# Patient Record
Sex: Female | Born: 1940
Health system: Southern US, Community
[De-identification: ages and names within clinical notes are randomized; demographics above are authoritative.]

## PROBLEM LIST (undated history)

## (undated) DIAGNOSIS — M1990 Unspecified osteoarthritis, unspecified site: Secondary | ICD-10-CM

## (undated) DIAGNOSIS — J849 Interstitial pulmonary disease, unspecified: Secondary | ICD-10-CM

## (undated) DIAGNOSIS — N819 Female genital prolapse, unspecified: Secondary | ICD-10-CM

## (undated) DIAGNOSIS — K760 Fatty (change of) liver, not elsewhere classified: Secondary | ICD-10-CM

## (undated) DIAGNOSIS — F419 Anxiety disorder, unspecified: Secondary | ICD-10-CM

## (undated) DIAGNOSIS — E785 Hyperlipidemia, unspecified: Secondary | ICD-10-CM

## (undated) DIAGNOSIS — F32A Depression, unspecified: Secondary | ICD-10-CM

## (undated) DIAGNOSIS — C801 Malignant (primary) neoplasm, unspecified: Secondary | ICD-10-CM

## (undated) DIAGNOSIS — R202 Paresthesia of skin: Secondary | ICD-10-CM

## (undated) DIAGNOSIS — Z9889 Other specified postprocedural states: Secondary | ICD-10-CM

## (undated) DIAGNOSIS — I872 Venous insufficiency (chronic) (peripheral): Secondary | ICD-10-CM

## (undated) DIAGNOSIS — I10 Essential (primary) hypertension: Secondary | ICD-10-CM

## (undated) DIAGNOSIS — N181 Chronic kidney disease, stage 1: Secondary | ICD-10-CM

## (undated) DIAGNOSIS — G479 Sleep disorder, unspecified: Secondary | ICD-10-CM

## (undated) DIAGNOSIS — R7303 Prediabetes: Secondary | ICD-10-CM

## (undated) DIAGNOSIS — E039 Hypothyroidism, unspecified: Secondary | ICD-10-CM

## (undated) DIAGNOSIS — J479 Bronchiectasis, uncomplicated: Secondary | ICD-10-CM

## (undated) DIAGNOSIS — R32 Unspecified urinary incontinence: Secondary | ICD-10-CM

## (undated) DIAGNOSIS — K219 Gastro-esophageal reflux disease without esophagitis: Secondary | ICD-10-CM

## (undated) DIAGNOSIS — R748 Abnormal levels of other serum enzymes: Secondary | ICD-10-CM

## (undated) DIAGNOSIS — G2581 Restless legs syndrome: Secondary | ICD-10-CM

## (undated) DIAGNOSIS — M810 Age-related osteoporosis without current pathological fracture: Secondary | ICD-10-CM

## (undated) DIAGNOSIS — Z85828 Personal history of other malignant neoplasm of skin: Secondary | ICD-10-CM

## (undated) DIAGNOSIS — K5730 Diverticulosis of large intestine without perforation or abscess without bleeding: Secondary | ICD-10-CM

## (undated) DIAGNOSIS — R001 Bradycardia, unspecified: Secondary | ICD-10-CM

## (undated) DIAGNOSIS — K449 Diaphragmatic hernia without obstruction or gangrene: Secondary | ICD-10-CM

## (undated) DIAGNOSIS — R112 Nausea with vomiting, unspecified: Secondary | ICD-10-CM

## (undated) HISTORY — PX: REDUCTION MAMMAPLASTY: SUR839

## (undated) HISTORY — PX: HYSTEROSCOPY: SHX211

## (undated) HISTORY — DX: Bronchiectasis, uncomplicated: J47.9

## (undated) HISTORY — DX: Interstitial pulmonary disease, unspecified: J84.9

## (undated) HISTORY — PX: HYSTEROSCOPY WITH D & C: SHX1775

## (undated) HISTORY — DX: Hyperlipidemia, unspecified: E78.5

## (undated) HISTORY — DX: Abnormal levels of other serum enzymes: R74.8

## (undated) HISTORY — DX: Depression, unspecified: F32.A

## (undated) HISTORY — PX: BREAST EXCISIONAL BIOPSY: SUR124

## (undated) HISTORY — PX: CHOLECYSTECTOMY: SHX55

## (undated) HISTORY — DX: Essential (primary) hypertension: I10

## (undated) HISTORY — PX: BREAST BIOPSY: SHX20

## (undated) HISTORY — PX: TONSILLECTOMY AND ADENOIDECTOMY: SUR1326

## (undated) HISTORY — PX: FOOT SURGERY: SHX648

## (undated) HISTORY — DX: Gastro-esophageal reflux disease without esophagitis: K21.9

## (undated) HISTORY — DX: Anxiety disorder, unspecified: F41.9

## (undated) HISTORY — PX: BREAST SURGERY: SHX581

## (undated) HISTORY — PX: HIP SURGERY: SHX245

---

## 1966-01-23 HISTORY — PX: CHOLECYSTECTOMY OPEN: SUR202

## 1983-01-24 HISTORY — PX: REDUCTION MAMMAPLASTY: SUR839

## 1997-12-02 ENCOUNTER — Other Ambulatory Visit: Admission: RE | Admit: 1997-12-02 | Discharge: 1997-12-02 | Payer: Self-pay | Admitting: Internal Medicine

## 1997-12-15 ENCOUNTER — Encounter: Payer: Self-pay | Admitting: Family Medicine

## 1997-12-15 ENCOUNTER — Ambulatory Visit (HOSPITAL_COMMUNITY): Admission: RE | Admit: 1997-12-15 | Discharge: 1997-12-15 | Payer: Self-pay | Admitting: Family Medicine

## 1997-12-22 ENCOUNTER — Encounter: Payer: Self-pay | Admitting: Family Medicine

## 1997-12-22 ENCOUNTER — Ambulatory Visit (HOSPITAL_COMMUNITY): Admission: RE | Admit: 1997-12-22 | Discharge: 1997-12-22 | Payer: Self-pay | Admitting: Family Medicine

## 1998-03-22 ENCOUNTER — Ambulatory Visit (HOSPITAL_COMMUNITY): Admission: RE | Admit: 1998-03-22 | Discharge: 1998-03-22 | Payer: Self-pay | Admitting: Urology

## 1998-05-24 HISTORY — PX: CARDIOVASCULAR STRESS TEST: SHX262

## 1998-05-26 ENCOUNTER — Encounter: Payer: Self-pay | Admitting: Emergency Medicine

## 1998-05-26 ENCOUNTER — Inpatient Hospital Stay (HOSPITAL_COMMUNITY): Admission: EM | Admit: 1998-05-26 | Discharge: 1998-05-27 | Payer: Self-pay | Admitting: Emergency Medicine

## 1998-05-28 ENCOUNTER — Ambulatory Visit (HOSPITAL_COMMUNITY): Admission: RE | Admit: 1998-05-28 | Discharge: 1998-05-28 | Payer: Self-pay | Admitting: Cardiology

## 1998-05-28 ENCOUNTER — Encounter: Payer: Self-pay | Admitting: Cardiology

## 1998-12-01 ENCOUNTER — Encounter: Payer: Self-pay | Admitting: Plastic Surgery

## 1998-12-01 ENCOUNTER — Ambulatory Visit (HOSPITAL_COMMUNITY): Admission: RE | Admit: 1998-12-01 | Discharge: 1998-12-01 | Payer: Self-pay | Admitting: Plastic Surgery

## 1998-12-02 ENCOUNTER — Other Ambulatory Visit: Admission: RE | Admit: 1998-12-02 | Discharge: 1998-12-02 | Payer: Self-pay | Admitting: Plastic Surgery

## 1998-12-02 ENCOUNTER — Encounter (INDEPENDENT_AMBULATORY_CARE_PROVIDER_SITE_OTHER): Payer: Self-pay | Admitting: Specialist

## 1999-03-24 ENCOUNTER — Other Ambulatory Visit: Admission: RE | Admit: 1999-03-24 | Discharge: 1999-03-24 | Payer: Self-pay | Admitting: Urology

## 1999-04-22 ENCOUNTER — Ambulatory Visit (HOSPITAL_COMMUNITY): Admission: RE | Admit: 1999-04-22 | Discharge: 1999-04-22 | Payer: Self-pay | Admitting: *Deleted

## 1999-04-22 ENCOUNTER — Encounter: Payer: Self-pay | Admitting: Plastic Surgery

## 1999-08-15 ENCOUNTER — Other Ambulatory Visit: Admission: RE | Admit: 1999-08-15 | Discharge: 1999-08-15 | Payer: Self-pay | Admitting: Family Medicine

## 2000-05-04 ENCOUNTER — Encounter: Payer: Self-pay | Admitting: Family Medicine

## 2000-05-04 ENCOUNTER — Ambulatory Visit (HOSPITAL_COMMUNITY): Admission: RE | Admit: 2000-05-04 | Discharge: 2000-05-04 | Payer: Self-pay | Admitting: Family Medicine

## 2001-04-19 ENCOUNTER — Ambulatory Visit (HOSPITAL_COMMUNITY): Admission: RE | Admit: 2001-04-19 | Discharge: 2001-04-19 | Payer: Self-pay | Admitting: Gastroenterology

## 2001-05-06 ENCOUNTER — Ambulatory Visit (HOSPITAL_COMMUNITY): Admission: RE | Admit: 2001-05-06 | Discharge: 2001-05-06 | Payer: Self-pay | Admitting: Family Medicine

## 2001-05-06 ENCOUNTER — Encounter: Payer: Self-pay | Admitting: Family Medicine

## 2002-05-08 ENCOUNTER — Ambulatory Visit (HOSPITAL_COMMUNITY): Admission: RE | Admit: 2002-05-08 | Discharge: 2002-05-08 | Payer: Self-pay | Admitting: Family Medicine

## 2002-05-08 ENCOUNTER — Encounter: Payer: Self-pay | Admitting: Family Medicine

## 2003-02-04 ENCOUNTER — Other Ambulatory Visit: Admission: RE | Admit: 2003-02-04 | Discharge: 2003-02-04 | Payer: Self-pay | Admitting: Family Medicine

## 2003-03-06 ENCOUNTER — Encounter: Admission: RE | Admit: 2003-03-06 | Discharge: 2003-06-04 | Payer: Self-pay | Admitting: Family Medicine

## 2003-03-08 ENCOUNTER — Emergency Department (HOSPITAL_COMMUNITY): Admission: AD | Admit: 2003-03-08 | Discharge: 2003-03-08 | Payer: Self-pay | Admitting: Family Medicine

## 2003-05-12 ENCOUNTER — Ambulatory Visit (HOSPITAL_COMMUNITY): Admission: RE | Admit: 2003-05-12 | Discharge: 2003-05-12 | Payer: Self-pay | Admitting: Family Medicine

## 2004-02-22 ENCOUNTER — Other Ambulatory Visit: Admission: RE | Admit: 2004-02-22 | Discharge: 2004-02-22 | Payer: Self-pay | Admitting: Family Medicine

## 2004-05-12 ENCOUNTER — Ambulatory Visit (HOSPITAL_COMMUNITY): Admission: RE | Admit: 2004-05-12 | Discharge: 2004-05-12 | Payer: Self-pay | Admitting: Internal Medicine

## 2004-12-14 ENCOUNTER — Ambulatory Visit (HOSPITAL_BASED_OUTPATIENT_CLINIC_OR_DEPARTMENT_OTHER): Admission: RE | Admit: 2004-12-14 | Discharge: 2004-12-14 | Payer: Self-pay | Admitting: Orthopedic Surgery

## 2004-12-14 ENCOUNTER — Ambulatory Visit (HOSPITAL_COMMUNITY): Admission: RE | Admit: 2004-12-14 | Discharge: 2004-12-14 | Payer: Self-pay | Admitting: Orthopedic Surgery

## 2004-12-14 HISTORY — PX: BUNIONECTOMY WITH HAMMERTOE RECONSTRUCTION: SHX5600

## 2005-02-28 ENCOUNTER — Ambulatory Visit (HOSPITAL_BASED_OUTPATIENT_CLINIC_OR_DEPARTMENT_OTHER): Admission: RE | Admit: 2005-02-28 | Discharge: 2005-02-28 | Payer: Self-pay | Admitting: Orthopedic Surgery

## 2005-02-28 HISTORY — PX: FOOT HARDWARE REMOVAL: SHX1661

## 2005-04-10 ENCOUNTER — Other Ambulatory Visit: Admission: RE | Admit: 2005-04-10 | Discharge: 2005-04-10 | Payer: Self-pay | Admitting: Family Medicine

## 2005-05-15 ENCOUNTER — Ambulatory Visit (HOSPITAL_COMMUNITY): Admission: RE | Admit: 2005-05-15 | Discharge: 2005-05-15 | Payer: Self-pay | Admitting: Family Medicine

## 2005-07-13 ENCOUNTER — Encounter: Admission: RE | Admit: 2005-07-13 | Discharge: 2005-07-13 | Payer: Self-pay | Admitting: Family Medicine

## 2006-05-03 ENCOUNTER — Other Ambulatory Visit: Admission: RE | Admit: 2006-05-03 | Discharge: 2006-05-03 | Payer: Self-pay | Admitting: Family Medicine

## 2006-05-30 ENCOUNTER — Ambulatory Visit (HOSPITAL_COMMUNITY): Admission: RE | Admit: 2006-05-30 | Discharge: 2006-05-30 | Payer: Self-pay | Admitting: Family Medicine

## 2006-08-22 ENCOUNTER — Encounter: Admission: RE | Admit: 2006-08-22 | Discharge: 2006-08-22 | Payer: Self-pay | Admitting: Family Medicine

## 2007-05-02 ENCOUNTER — Ambulatory Visit (HOSPITAL_COMMUNITY): Admission: RE | Admit: 2007-05-02 | Discharge: 2007-05-02 | Payer: Self-pay | Admitting: Family Medicine

## 2007-07-11 ENCOUNTER — Other Ambulatory Visit: Admission: RE | Admit: 2007-07-11 | Discharge: 2007-07-11 | Payer: Self-pay | Admitting: Family Medicine

## 2008-04-29 ENCOUNTER — Ambulatory Visit (HOSPITAL_COMMUNITY): Admission: RE | Admit: 2008-04-29 | Discharge: 2008-04-29 | Payer: Self-pay | Admitting: Orthopedic Surgery

## 2008-04-29 HISTORY — PX: EXCISION/RELEASE BURSA HIP: SHX5014

## 2008-05-07 ENCOUNTER — Ambulatory Visit (HOSPITAL_COMMUNITY): Admission: RE | Admit: 2008-05-07 | Discharge: 2008-05-07 | Payer: Self-pay | Admitting: Family Medicine

## 2008-11-18 ENCOUNTER — Ambulatory Visit: Payer: Self-pay | Admitting: Licensed Clinical Social Worker

## 2008-11-26 ENCOUNTER — Ambulatory Visit: Payer: Self-pay | Admitting: Licensed Clinical Social Worker

## 2008-12-07 ENCOUNTER — Ambulatory Visit: Payer: Self-pay | Admitting: Licensed Clinical Social Worker

## 2008-12-25 ENCOUNTER — Ambulatory Visit: Payer: Self-pay | Admitting: Licensed Clinical Social Worker

## 2009-01-19 ENCOUNTER — Other Ambulatory Visit: Admission: RE | Admit: 2009-01-19 | Discharge: 2009-01-19 | Payer: Self-pay | Admitting: Family Medicine

## 2009-01-27 ENCOUNTER — Ambulatory Visit: Payer: Self-pay | Admitting: Licensed Clinical Social Worker

## 2009-02-04 ENCOUNTER — Ambulatory Visit: Payer: Self-pay | Admitting: Licensed Clinical Social Worker

## 2009-05-25 ENCOUNTER — Ambulatory Visit (HOSPITAL_COMMUNITY): Admission: RE | Admit: 2009-05-25 | Discharge: 2009-05-25 | Payer: Self-pay | Admitting: Family Medicine

## 2010-02-13 ENCOUNTER — Encounter: Payer: Self-pay | Admitting: Family Medicine

## 2010-05-04 LAB — COMPREHENSIVE METABOLIC PANEL
ALT: 31 U/L (ref 0–35)
AST: 32 U/L (ref 0–37)
Alkaline Phosphatase: 45 U/L (ref 39–117)
CO2: 23 mEq/L (ref 19–32)
Calcium: 8.8 mg/dL (ref 8.4–10.5)
GFR calc Af Amer: >60 mL/min (ref >60)
Potassium: 4.4 mEq/L (ref 3.5–5.1)
Sodium: 135 mEq/L (ref 135–145)
Total Protein: 6.7 g/dL (ref 6.0–8.3)

## 2010-05-04 LAB — URINE MICROSCOPIC-ADD ON

## 2010-05-04 LAB — CBC
MCHC: 33.8 g/dL (ref 30.0–36.0)
RBC: 4.25 MIL/uL (ref 3.87–5.11)
RDW: 13.1 % (ref 11.5–15.5)

## 2010-05-04 LAB — URINALYSIS, ROUTINE W REFLEX MICROSCOPIC
Glucose, UA: NEGATIVE mg/dL
Ketones, ur: NEGATIVE mg/dL
Leukocytes, UA: NEGATIVE
Nitrite: NEGATIVE
pH: 6 (ref 5.0–8.0)

## 2010-05-09 ENCOUNTER — Other Ambulatory Visit (HOSPITAL_COMMUNITY): Payer: Self-pay | Admitting: Family Medicine

## 2010-05-09 DIAGNOSIS — Z1231 Encounter for screening mammogram for malignant neoplasm of breast: Secondary | ICD-10-CM

## 2010-05-30 ENCOUNTER — Ambulatory Visit (HOSPITAL_COMMUNITY)
Admission: RE | Admit: 2010-05-30 | Discharge: 2010-05-30 | Disposition: A | Discharge status: Home / Self Care | Payer: Medicare Other | Source: Ambulatory Visit | Attending: Family Medicine | Admitting: Family Medicine

## 2010-05-30 DIAGNOSIS — Z1231 Encounter for screening mammogram for malignant neoplasm of breast: Secondary | ICD-10-CM | POA: Insufficient documentation

## 2010-05-31 ENCOUNTER — Other Ambulatory Visit: Payer: Self-pay | Admitting: Family Medicine

## 2010-05-31 DIAGNOSIS — R928 Other abnormal and inconclusive findings on diagnostic imaging of breast: Secondary | ICD-10-CM

## 2010-06-02 ENCOUNTER — Other Ambulatory Visit (HOSPITAL_COMMUNITY)
Admission: RE | Admit: 2010-06-02 | Discharge: 2010-06-02 | Disposition: A | Discharge status: Home / Self Care | Payer: Medicare Other | Source: Ambulatory Visit | Attending: Family Medicine | Admitting: Family Medicine

## 2010-06-02 ENCOUNTER — Other Ambulatory Visit: Payer: Self-pay | Admitting: Family Medicine

## 2010-06-02 DIAGNOSIS — Z124 Encounter for screening for malignant neoplasm of cervix: Secondary | ICD-10-CM | POA: Insufficient documentation

## 2010-06-06 ENCOUNTER — Ambulatory Visit
Admission: RE | Admit: 2010-06-06 | Discharge: 2010-06-06 | Disposition: A | Discharge status: Home / Self Care | Payer: Medicare Other | Source: Ambulatory Visit | Attending: Family Medicine | Admitting: Family Medicine

## 2010-06-06 DIAGNOSIS — R928 Other abnormal and inconclusive findings on diagnostic imaging of breast: Secondary | ICD-10-CM

## 2010-06-07 NOTE — Op Note (Signed)
Nicole [name], Nicole [name]            ACCOUNT NO.:  [id number]   MEDICAL RECORD NO.:  [id number]          PATIENT TYPE:  AMB   LOCATION:  DAY                          FACILITY:  [facility]   PHYSICIAN:  Ollen Gross, M.D.    DATE OF BIRTH:  March 04, 1940   DATE OF PROCEDURE:  04/29/2008  DATE OF DISCHARGE:                               OPERATIVE REPORT   PREOPERATIVE DIAGNOSIS:  Left hip intractable bursitis and gluteal  tendon tear.   POSTOPERATIVE DIAGNOSIS:  Left hip intractable bursitis and gluteal  tendon tear.   PROCEDURE:  Left hip bursectomy with gluteal tendon repair.   SURGEON:  Ollen Gross, M.D.   ASSISTANT:  Avel Peace, P.A.-C.   ANESTHESIA:  General.   ESTIMATED BLOOD LOSS:  Minimal.   DRAINS:  None.   COMPLICATIONS:  None.   CONDITION:  Stable to recovery.   CLINICAL NOTE:  Nicole [name] is a 70 year old female with intractable  left lateral hip pain.  She has failed extensive nonoperative  management.  She presents now for the above-mentioned procedure.   PROCEDURE IN DETAIL:  After successful administration of general  anesthetic, the patient was placed in the right lateral decubitus  position with the left side up and held with the hip positioner.  Left  lower extremity was isolated from the perineum and prepped and draped in  the usual sterile fashion.  An approximately 5-inch incision was made  centered over the tip of the trochanter coursing from superior to  inferior.  Skin was cut with a 10 blade through subcutaneous tissue to  the level of the fascia lata.  This was incised in line with the skin  incision.  Very thickened bursa is identified and is excised.  I then  palpated the gluteus medius tendon and inspected it visually, and there  was no tear there.  Palpating on the edge of the greater trochanter  there was an obvious step off deep to the gluteus medius tendon  consistent with a gluteus minimus tear.  This was about the junction of  the mid and  posterior third of the trochanter.  I made a longitudinal  split in the gluteus medius to get down to the gluteus minimus and then  placed a Mitek anchor at the tip of the greater trochanter and then  placed the sutures through the edge of the gluteus minimus tear and  reattached it to the trochanter.  It felt like a very stable repair.  I  then oversewed the vertical split in the gluteus medius with the  Ethibond suture.  I palpated along the edge of the greater trochanter,  and we had a very nice repair.  I then rotated the hip and the  piriformis on internal rotation appeared to be impinging on the sciatic  nerve.  I released the piriformis as this could be another potential  cause for her lateral posterolateral hip pain.  We then thoroughly  irrigated the wound and closed the fascia lata with interrupted #1  Vicryl after achieving meticulous hemostasis.  A small triangle of  tissue was removed over the edge of the  greater trochanter so it would  not rub.  The subcutaneous tissue was then closed with interrupted #1  Vicryl and interrupted 2-0 Vicryl.  Subcuticular was closed  with running 4-0 Monocryl.  Thirty mL of 0.25% Marcaine with epinephrine  injected into the subcutaneous tissues.  The incision was then cleaned  and dried and Steri-Strips and bulky sterile dressing applied.  She was  then awakened and transferred to recovery in stable condition.      Ollen Gross, M.D.  Electronically Signed     FA/MEDQ  D:  04/29/2008  T:  04/29/2008  Job:  [id number]

## 2010-06-10 NOTE — Op Note (Signed)
[name], [name]            ACCOUNT NO.:  [id number]   MEDICAL RECORD NO.:  [id number]          PATIENT TYPE:  AMB   LOCATION:  DSC                          FACILITY:  [facility]   PHYSICIAN:  Leonides Grills, M.D.     DATE OF BIRTH:  1940-12-23   DATE OF PROCEDURE:  12/14/2004  DATE OF DISCHARGE:                                 OPERATIVE REPORT   PREOPERATIVE DIAGNOSES:  1.  Left second metatarsalgia.  2.  Left second hallux valgus.  3.  Left second hammer toe.  4.  Left tight gastrocnemius.   POSTOPERATIVE DIAGNOSES:  1.  Left second metatarsalgia.  2.  Left second hallux valgus.  3.  Left second hammer toe.  4.  Left tight gastrocnemius.   OPERATION:  1.  Left chevron bunionectomy.  2.  Stress x-ray of left foot.  3.  Left second Weil metatarsal shortening osteotomy.  4.  Left gastrocnemius slide.  5.  Left second metatarsophalangeal joint dorsal capsulotomy with collateral      release.  6.  Left second toe proximal phalanx head resection.  7.  Left second toe extensor digitorum brevis to extensor digitorum longus      tendon transfer.   ANESTHESIA:  General.   SURGEON:  Leonides Grills, M.D.   ASSISTANT:  Lianne Cure, P.A.-C.   ESTIMATED BLOOD LOSS:  Minimal.   TOURNIQUET TIME:  Approximately an hour.   COMPLICATIONS:  None.   DISPOSITION:  Stable to the PAR.   INDICATIONS:  This is a 70 year old female who has had longstanding left  forefoot pain that was interfering with her life to the point where she  could not do what she wanted to do.  She was consented for the above  procedure.  All risks, which include infection, nerve or vessel injury,  nonunion, malunion, hardware irritation, hardware failure, recurrence of the  deformity, persistent pain, worse pain, prolonged recovery, stiffness,  arthritis, were all explained.  Questions were encouraged and answered.   OPERATION:  The patient was brought to the operating room and placed in the  supine  position after adequate general endotracheal tube anesthesia was  administered as well as Ancef 1 g IV piggyback.  The left lower extremity  was then prepped and draped in a sterile manner over a proximally-placed  thigh tourniquet.  We started the procedure with a longitudinal incision  over the medial aspect of the gastrocnemius musculotendinous junction.  Dissection was carried out through skin.  Hemostasis was obtained.  The  fascia was opened in line with the incision.  The conjoint region was then  developed between the gastrocnemius and soleus.  Soft tissue was then  elevated off the posterior aspect of the gastrocnemius.  The sural nerve was  identified and protected throughout the case.  The gastrocnemius was then  released with a curved Mayo scissors.  This had an outstanding release of  the tight gastrocnemius.  The area was copiously irrigated with normal  saline.  The subcu was closed with 3-0 Vicryl.  The skin was closed with 4-0  Monocryl subcuticular stitch.  Steri-Strips were applied.  We then gravity-  exsanguinated the left lower extremity and the tourniquet was elevated to  290 mmHg.  A longitudinal incision was then made in line over the medial  aspect of the left great toe MTP joint.  Dissection was carried down through  skin, hemostasis was obtained.  Neurovascular structures were identified  both superiorly and inferiorly and protected throughout the case.  An L-  shaped capsulotomy was then made.  A simple bunionectomy was then performed  with a sagittal saw.  The lateral capsule was then released with curved  Beaver blade.  Nicole [name]'s ridge was then rounded off with a rongeur.  The  soft tissue was then elevated and protected on the dorsal and plantar  aspects of the foot and the center head was then identified and a chevron  osteotomy was then created with a sagittal saw.  The head was then  translated approximately 3-4 mm laterally and then fixed with 2 mm  fully-  threaded cortical set screws using a 1.5 mm drill hole, respectively.  This  screw was countersunk.  This had an excellent purchase and maintenance of  the correction.  Redundant bone medially was then trimmed off with the  sagittal saw.  The joint and area were copiously irrigated with normal  saline.  The capsule was then advanced and repaired both superiorly and  proximally with 2-0 Vicryl suture.  This had an Conservation officer, historic buildings.  The toe  was in excellent alignment and range of motion was excellent.  Stress x-rays  were obtained in the AP and lateral planes that showed no gross motion  across the osteotomy site, fixation in proper position, and flexion was  excellent as well.  Once the area was copiously irrigated with normal  saline, we then made a longitudinal incision over the second toe dorsally.  Dissection was carried down through skin and hemostasis was obtained.  The  extensor digitorum longus and brevis tendons were identified.  The brevis  was tenotomized distal lateral and the longus proximal medial and retracted  out of harm's way for later transfer.  We then entered the MTP joint and a  dorsal capsulotomy with collateral release was performed, protecting the  soft tissue __________ both medial and lateral.  The distal aspect of the  proximal phalanx head was then skeletonized and the head was then removed  with a rongeur, followed by a bone cutter.  We did not do the FDL to  proximal phalanx tendon transfer due to the fact that the toe was not  dislocated and the toe was almost completely repaired with the above  procedure at this point as well.  We then plantar flexed the second toe and  performed a Weil osteotomy with sagittal saw blades parallel to the plantar  aspect of the foot.  The head was then translated approximately 3 mm  proximally and affixed with a 1.5 mm fully-threaded cortical lag screw using 1.5 and 1.1 mm drill holes, respectively.  This had  excellent purchase and  maintenance of the correction.  The redundant bone shelf was then trimmed  off with a rongeur.  The toe was ranged.  There were no impinging areas  dorsally.  The toe after the shortening was actually in a better position at  this point as well, and again we did not elect to do the FDL to proximal  phalanx tendon transfer.  We then placed a K-wire antegrade through the  middle and distal phalanx, reduced the PIP  joint and fired this retrograde.  We then reduced the MTP joint and fired this into the metatarsal.  We then  obtained x-ray, AP plane and lateral, and showed that the shortening was in  excellent position.  Fixation was in proper position as well.  The  tourniquet was inflated.  The toes pinked up nicely.  Capillary refill less  than two seconds.  A skin-relieving incision was made on either side of the  K-wire.  The K-wires were bent, cut and capped.  The wounds were copiously  irrigated with normal saline.  The skin was closed with 4-0 nylon over all  wounds.  A sterile dressing was applied, a Programmer, multimedia dressing was applied.  A CAM walker boot was applied.  The patient was stable to the PAR.   ADDENDUM:  Prior to wound closure, the EDB to EDL tendons were transferred  and repaired to one another using 3-0 PDS suture.  This had an Interior and spatial designer.  After this was done, the tourniquet was deflated and the remaining  portion of the dictation transpired.      Leonides Grills, M.D.  Electronically Signed     PB/MEDQ  D:  12/14/2004  T:  12/14/2004  Job:  [id number]

## 2010-06-10 NOTE — Procedures (Signed)
[name]  Patient:    Nicole [name], Nicole [name] Visit Number: [id number] MRN: [id number]          Service Type: END Location: ENDO Attending Physician:  Louie Bun Dictated by:   Everardo All Madilyn Fireman, M.D. Proc. Date: 04/19/01 Admit Date:  04/19/2001   CC:         Gretta Arab. Valentina Lucks, M.D.   Procedure Report  PROCEDURE:  Colonoscopy.  INDICATION FOR PROCEDURE:  Screening colonoscopy in a 70 year old patient with no recent colon screening.  DESCRIPTION OF PROCEDURE:  The patient was placed in the left lateral decubitus position and placed on the pulse monitor with continuous low flow oxygen delivered via nasal cannula. She was sedated with 70 mg IV Demerol and 8 mg IV Versed. The Olympus video colonoscope was inserted into the rectum and advanced to the cecum, confirmed by transillumination of McBurneys point and visualization of the ileocecal valve and appendiceal orifice. The prep was excellent. Within the cecum, there was a 4-mm sessile polyp that was fulgurated with the closed tip of the hot biopsy forceps and no tissue was removed. However, the lesion appeared to be completely destroyed. Within the ascending, transverse, descending, and sigmoid colon, there were a few scattered diverticula, no other abnormalities. No other polyps were seen. The rectum appeared normal and retroflex view of the anus revealed no obvious internal hemorrhoids. The colonoscope was then withdrawn and the patient returned to the recovery room in stable condition. She tolerated the procedure well. There were no immediate complications.  IMPRESSION: 1. Diminutive cecal polyp fulgurated by hot biopsy with no tissue removed. 2. Scattered pancolonic diverticulosis.  PLAN:  Will probably repeat colonoscopy in five years. Dictated by:   Everardo All Madilyn Fireman, M.D. Attending Physician:  Louie Bun DD:  04/19/01 TD:  04/19/01 Job: [id number]

## 2010-06-10 NOTE — Op Note (Signed)
NAMEGIANNIE, SOLIDAY            ACCOUNT NO.:  [id number]   MEDICAL RECORD NO.:  [id number]          PATIENT TYPE:  AMB   LOCATION:  DSC                          FACILITY:  [facility]   PHYSICIAN:  Leonides Grills, M.D.     DATE OF BIRTH:  1940-03-31   DATE OF PROCEDURE:  02/28/2005  DATE OF DISCHARGE:                                 OPERATIVE REPORT   PREOPERATIVE DIAGNOSIS:  Left complication of hardware, foot.   POSTOPERATIVE DIAGNOSIS:  Left complication of hardware, foot.   OPERATION PERFORMED:  Removal of hardware, deep left foot.   SURGEON:  Leonides Grills, M.D.   ASSISTANT:  Lianne Cure, P.A.   ANESTHESIA:  General.   ESTIMATED BLOOD LOSS:  Minimal.   TOURNIQUET TIME:  Approximately 15 minutes.   COMPLICATIONS:  None.   DISPOSITION:  Stable to PR.   INDICATIONS FOR PROCEDURE:  The patient is a 70 year old very pleasant  female who has had longstanding forefoot pain.  She had a second Weil  metatarsal shortening osteotomy and postoperatively it was found that the  screw was prominent plantarly and irritating her.  It was at this point it  was elected to remove the screw.  It was explained to her that there may a  nonunion of the osteotomy and this may need to be refixed and redrilled and  she understood this.  We went over this in great detail today as well as  risks which include infection, neurovascular injury, cock up toe deformity  secondary to scar tissue formation, persistent pain, worsening pain,  prolonged recovery and obviously refixation of the nonunion were all  explained, questions were encouraged and answered.   DESCRIPTION OF PROCEDURE:  The patient was brought to the operating room and  placed in supine position after adequate general endotracheal tube  anesthesia was administered as well as Ancef 1 g IV piggyback.  The left  lower extremity was then prepped and draped in sterile manner over a  proximally placed thigh tourniquet.  The limb was gravity  exsanguinated.  Tourniquet was elevated  to 290 mmHg.  A longitudinal incision over the  previous incision at the proximal aspect of the wound was then made.  Dissection was then carried down through skin and hemostasis was obtained.  Extensor tendons were identified and longitudinal capsulotomy was made just  medial to the tendon.  The tendons were retracted out of harm's way and  capsule was then opened.  The screw was then identified and removed with a  minifragment screwdriver.  Once this was done, we then palpated the head  with a Glorious Peach and this was extremely solid and completely united.  We ranged  the toe and active loaded the area as well and  was found there was no impinging are and there was no gross motion at the  head osteotomy site.  The area was then copiously irrigated with normal  saline.  The tourniquet was deflated, hemostasis was obtained.  The skin was  closed with 4-0 nylon suture.  Sterile dressing was applied.  Hard sole shoe  was applied.  The patient was stable  to the PR.      Leonides Grills, M.D.  Electronically Signed     PB/MEDQ  D:  02/28/2005  T:  02/28/2005  Job:  [id number]

## 2010-10-14 ENCOUNTER — Other Ambulatory Visit: Payer: Self-pay | Admitting: Gastroenterology

## 2010-10-17 ENCOUNTER — Ambulatory Visit
Admission: RE | Admit: 2010-10-17 | Discharge: 2010-10-17 | Disposition: A | Discharge status: Home / Self Care | Payer: Medicare Other | Source: Ambulatory Visit | Attending: Gastroenterology | Admitting: Gastroenterology

## 2010-11-07 ENCOUNTER — Other Ambulatory Visit: Payer: Self-pay | Admitting: Family Medicine

## 2010-11-07 DIAGNOSIS — Z09 Encounter for follow-up examination after completed treatment for conditions other than malignant neoplasm: Secondary | ICD-10-CM

## 2010-11-08 ENCOUNTER — Other Ambulatory Visit: Payer: Self-pay | Admitting: Gastroenterology

## 2010-11-10 ENCOUNTER — Ambulatory Visit
Admission: RE | Admit: 2010-11-10 | Discharge: 2010-11-10 | Disposition: A | Discharge status: Home / Self Care | Payer: Medicare Other | Source: Ambulatory Visit | Attending: Gastroenterology | Admitting: Gastroenterology

## 2010-11-10 MED ORDER — IOHEXOL 300 MG/ML  SOLN
100.0000 mL | Freq: Once | INTRAMUSCULAR | Status: AC | PRN
  Administered 2010-11-10: 100 mL via INTRAVENOUS

## 2010-12-20 ENCOUNTER — Ambulatory Visit
Admission: RE | Admit: 2010-12-20 | Discharge: 2010-12-20 | Disposition: A | Discharge status: Home / Self Care | Payer: Medicare Other | Source: Ambulatory Visit | Attending: Family Medicine | Admitting: Family Medicine

## 2010-12-20 ENCOUNTER — Other Ambulatory Visit: Payer: Self-pay | Admitting: Family Medicine

## 2010-12-20 DIAGNOSIS — Z09 Encounter for follow-up examination after completed treatment for conditions other than malignant neoplasm: Secondary | ICD-10-CM

## 2010-12-28 ENCOUNTER — Ambulatory Visit
Admission: RE | Admit: 2010-12-28 | Discharge: 2010-12-28 | Disposition: A | Discharge status: Home / Self Care | Payer: Medicare Other | Source: Ambulatory Visit | Attending: Family Medicine | Admitting: Family Medicine

## 2010-12-28 DIAGNOSIS — Z09 Encounter for follow-up examination after completed treatment for conditions other than malignant neoplasm: Secondary | ICD-10-CM

## 2010-12-28 HISTORY — PX: BREAST BIOPSY: SHX20

## 2010-12-28 HISTORY — PX: BREAST EXCISIONAL BIOPSY: SUR124

## 2011-02-28 ENCOUNTER — Ambulatory Visit: Payer: Medicare Other | Attending: Orthopedic Surgery

## 2011-02-28 DIAGNOSIS — IMO0001 Reserved for inherently not codable concepts without codable children: Secondary | ICD-10-CM | POA: Insufficient documentation

## 2011-02-28 DIAGNOSIS — M25619 Stiffness of unspecified shoulder, not elsewhere classified: Secondary | ICD-10-CM | POA: Insufficient documentation

## 2011-02-28 DIAGNOSIS — M25519 Pain in unspecified shoulder: Secondary | ICD-10-CM | POA: Insufficient documentation

## 2011-11-29 ENCOUNTER — Encounter (HOSPITAL_COMMUNITY): Payer: Self-pay | Admitting: Psychiatry

## 2011-11-29 ENCOUNTER — Ambulatory Visit (INDEPENDENT_AMBULATORY_CARE_PROVIDER_SITE_OTHER): Payer: Medicare Other | Admitting: Psychiatry

## 2011-11-29 VITALS — BP 155/89 | HR 60 | Ht 62.0 in | Wt 126.8 lb

## 2011-11-29 DIAGNOSIS — F3289 Other specified depressive episodes: Secondary | ICD-10-CM

## 2011-11-29 DIAGNOSIS — F329 Major depressive disorder, single episode, unspecified: Secondary | ICD-10-CM

## 2011-11-29 MED ORDER — FLUOXETINE HCL 10 MG PO CAPS: 10.0000 mg | ORAL_CAPSULE | Freq: Every day | ORAL | Status: DC
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
## 2011-12-04 ENCOUNTER — Other Ambulatory Visit: Payer: Self-pay | Admitting: Family Medicine

## 2011-12-04 DIAGNOSIS — Z1231 Encounter for screening mammogram for malignant neoplasm of breast: Secondary | ICD-10-CM

## 2011-12-20 ENCOUNTER — Encounter (HOSPITAL_COMMUNITY): Payer: Self-pay | Admitting: Psychiatry

## 2011-12-20 ENCOUNTER — Ambulatory Visit (INDEPENDENT_AMBULATORY_CARE_PROVIDER_SITE_OTHER): Payer: Medicare Other | Admitting: Psychiatry

## 2011-12-20 VITALS — BP 145/74 | HR 78 | Wt 123.4 lb

## 2011-12-20 DIAGNOSIS — F329 Major depressive disorder, single episode, unspecified: Secondary | ICD-10-CM

## 2011-12-20 MED ORDER — FLUOXETINE HCL 20 MG PO CAPS: 20.0000 mg | ORAL_CAPSULE | Freq: Every day | ORAL | Status: DC
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
## 2012-01-02 ENCOUNTER — Ambulatory Visit (INDEPENDENT_AMBULATORY_CARE_PROVIDER_SITE_OTHER): Payer: Medicare Other | Admitting: Licensed Clinical Social Worker

## 2012-01-02 ENCOUNTER — Encounter (HOSPITAL_COMMUNITY): Payer: Self-pay | Admitting: Licensed Clinical Social Worker

## 2012-01-02 DIAGNOSIS — F32A Depression, unspecified: Secondary | ICD-10-CM | POA: Insufficient documentation

## 2012-01-02 DIAGNOSIS — F329 Major depressive disorder, single episode, unspecified: Secondary | ICD-10-CM
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
## 2012-01-08 ENCOUNTER — Telehealth (HOSPITAL_COMMUNITY): Payer: Self-pay

## 2012-01-08 NOTE — Telephone Encounter (Signed)
11:47AM 01/08/12 CALLED PT LEFT MSG IN REFERENCE TO COMING IN TODAY AT 1PM DUE TO CANCEL.LIST./SH

## 2012-01-12 ENCOUNTER — Ambulatory Visit
Admission: RE | Admit: 2012-01-12 | Discharge: 2012-01-12 | Disposition: A | Discharge status: Home / Self Care | Payer: Medicare Other | Source: Ambulatory Visit | Attending: Family Medicine | Admitting: Family Medicine

## 2012-01-12 DIAGNOSIS — Z1231 Encounter for screening mammogram for malignant neoplasm of breast: Secondary | ICD-10-CM

## 2012-01-24 ENCOUNTER — Other Ambulatory Visit (HOSPITAL_COMMUNITY): Payer: Self-pay | Admitting: Psychiatry

## 2012-01-25 ENCOUNTER — Other Ambulatory Visit (HOSPITAL_COMMUNITY): Payer: Self-pay | Admitting: Psychiatry

## 2012-01-25 ENCOUNTER — Ambulatory Visit (INDEPENDENT_AMBULATORY_CARE_PROVIDER_SITE_OTHER): Payer: Medicare Other | Admitting: Licensed Clinical Social Worker

## 2012-01-25 ENCOUNTER — Telehealth (HOSPITAL_COMMUNITY): Payer: Self-pay | Admitting: *Deleted

## 2012-01-25 DIAGNOSIS — F329 Major depressive disorder, single episode, unspecified: Secondary | ICD-10-CM

## 2012-01-25 MED ORDER — FLUOXETINE HCL 20 MG PO CAPS: 20.0000 mg | ORAL_CAPSULE | Freq: Every day | ORAL | Status: DC

## 2012-01-25 NOTE — Telephone Encounter (Signed)
Patient states she saw [name] today and discussed problems sleeping.  States that if she could getconsistent sleep, she would feel less depressed and less anxious. Says she has tried Ambien and several OTC products for sleep without success. She states she and Dr.Arfeen discussed Trazodone to help her sleep in last appointment. She has an appt with Dr.Arfeen 02/15/12, but wants to address problems with sleep now. Requests call from provider and medication if possible.

## 2012-01-25 NOTE — Progress Notes (Signed)
   THERAPIST PROGRESS NOTE  Session Time: 9:30am-10:20am  Participation Level: Active  Behavioral Response: Well GroomedAlertAnxious  Type of Therapy: Individual Therapy  Treatment Goals addressed: Anxiety and Coping  Interventions: CBT, Strength-based, Supportive and Reframing  Summary: Nicole [name] is a 72 y.o. female who presents with anxious mood and affect. She is tired and is not sleeping well. She wants to try Trazadone for sleep and plans to talk to Dr. Lolly Mustache about this at her next visit. She feels that if she could get regular and consistent sleep, she would feel less depressed and less anxious. Her primary stressors are her daughters and her husbands alocohol addictions. She feels she is enabling her daughter by allowing her to live in one of her homes and pay for everything, but she is torn because she does not want her grandchildren to suffer. She is frustrated with her husbands addiction and his expectations of her daughter which she feels are unrealistic and conditional. She attends [employer] once per week and would like to attend more frequently, but is too tired. 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 She does socialize with friends and finds this helpful.    Suicidal/Homicidal: Nowithout intent/plan  Therapist Response: Assessed patients current functioning and reviewed progress. Reviewed coping strategies. Assessed patients safety and assisted in identifying protective factors.  Reviewed crisis plan with patient. Assisted patient with the expression of her feelings of frustration. Reviewed healthy boundaries and assertive communication. Used CBT to assist patient with the identification of negative distortions and irrational thoughts. Encouraged patient to verbalize alternative and factual responses which challenge thought distortions. Reviewed patients self care plan. Assessed  progress related to self care. Patient's self care is good. Recommend proper diet, regular  exercise, socialization and recreation.   Plan: Return again in one to two weeks.  Diagnosis: Axis I: Depressive Disorder NOS    Axis II: No diagnosis    Nicole Filippone, LCSW 01/25/2012

## 2012-01-26 MED ORDER — TRAZODONE HCL 100 MG PO TABS: 50.0000 mg | ORAL_TABLET | Freq: Every day | ORAL | Status: DC

## 2012-01-26 NOTE — Telephone Encounter (Signed)
Call returned.  Patient requesting trazodone 50 mg at bedtime.  Call trazodone at her pharmacy for her insomnia.  Recommend to call us back if she does not feel any improvement.  I explained risks and benefits of medication.

## 2012-01-26 NOTE — Addendum Note (Signed)
Addended by: Kathryne Sharper T on: 01/26/2012 11:45 AM   Modules accepted: Orders

## 2012-01-29 ENCOUNTER — Telehealth (HOSPITAL_COMMUNITY): Payer: Self-pay

## 2012-01-30 ENCOUNTER — Other Ambulatory Visit (HOSPITAL_COMMUNITY): Payer: Self-pay | Admitting: Psychiatry

## 2012-02-09 ENCOUNTER — Ambulatory Visit (INDEPENDENT_AMBULATORY_CARE_PROVIDER_SITE_OTHER): Payer: Medicare Other | Admitting: Licensed Clinical Social Worker

## 2012-02-09 DIAGNOSIS — F329 Major depressive disorder, single episode, unspecified: Secondary | ICD-10-CM

## 2012-02-09 NOTE — Progress Notes (Signed)
   THERAPIST PROGRESS NOTE  Session Time: 9:30am-10:20am  Participation Level: Active  Behavioral Response: Well GroomedAlertAnxious  Type of Therapy: Individual Therapy  Treatment Goals addressed: Anxiety and Coping  Interventions: CBT, Solution Focused, Strength-based, Supportive and Reframing  Summary: Nicole [name] is a 72 y.o. female who presents with anxious mood and affect. She reports increased stress and anxiety related to her daughter who was recently hospitalized at [facility] following two arrests for driving while impaired. Her daughter has been discharged and patient is uncertain of her treatment plan or commitment to wellness and sobriety. She endorses a poor relationship with her daughter and wants to improve this, but feels she has expectations which are unreasonable and is trying not to have any expectations. Initially she states that she is not angry with her daughter, but as the session progresses, she does identify anger with her daughter for not putting her children first. Patient explores her boundaries with her daughter and ways in which she can navigate this relationship. Her sleep has improved with Trazodone and her appetite is wnl.   Suicidal/Homicidal: Nowithout intent/plan  Therapist Response: Assessed patients current functioning and reviewed progress. Reviewed coping strategies. Assessed patients safety and assisted in identifying protective factors.  Reviewed crisis plan with patient. Assisted patient with the expression of her feelings of anxiety. Used CBT to assist patient with the identification of negative distortions and irrational thoughts. Encouraged patient to verbalize alternative and factual responses which challenge thought distortions. Explores patients codependency, healthy boundaries and assertive communication. Used DBT to practice mindfulness, review distraction list and improve distress tolerance skills. Reviewed patients self care plan. Assessed   progress related to self care. Patient's self care is good. Recommend proper diet, regular exercise, socialization and recreation.   Plan: Return again in one to two weeks.  Diagnosis: Axis I: Depressive Disorder NOS    Axis II: No diagnosis    Mikko Lewellen, LCSW 02/09/2012

## 2012-02-15 ENCOUNTER — Ambulatory Visit (HOSPITAL_COMMUNITY): Payer: Self-pay | Admitting: Psychiatry

## 2012-02-16 ENCOUNTER — Encounter (HOSPITAL_COMMUNITY): Payer: Self-pay | Admitting: Licensed Clinical Social Worker

## 2012-02-16 ENCOUNTER — Ambulatory Visit (HOSPITAL_COMMUNITY): Payer: Medicare Other | Admitting: Licensed Clinical Social Worker

## 2012-02-16 NOTE — Progress Notes (Signed)
Patient ID: Nicole [name], female   DOB: 1941/01/10, 72 y.o.   MRN: [id number] Patient showed up one hour early for her appointment and could not stay.

## 2012-02-25 ENCOUNTER — Other Ambulatory Visit (HOSPITAL_COMMUNITY): Payer: Self-pay | Admitting: Psychiatry

## 2012-02-26 ENCOUNTER — Other Ambulatory Visit (HOSPITAL_COMMUNITY): Payer: Self-pay | Admitting: Psychiatry

## 2012-02-27 ENCOUNTER — Encounter (HOSPITAL_COMMUNITY): Payer: Self-pay | Admitting: Psychiatry

## 2012-02-27 ENCOUNTER — Ambulatory Visit (INDEPENDENT_AMBULATORY_CARE_PROVIDER_SITE_OTHER): Payer: Medicare Other | Admitting: Psychiatry

## 2012-02-27 VITALS — BP 159/73 | HR 56 | Wt 125.8 lb

## 2012-02-27 DIAGNOSIS — F329 Major depressive disorder, single episode, unspecified: Secondary | ICD-10-CM

## 2012-02-27 MED ORDER — FLUOXETINE HCL 20 MG PO CAPS: 20.0000 mg | ORAL_CAPSULE | Freq: Every day | ORAL | Status: DC

## 2012-02-27 MED ORDER — TRAZODONE HCL 50 MG PO TABS: 50.0000 mg | ORAL_TABLET | Freq: Every day | ORAL | Status: DC
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
## 2012-03-01 ENCOUNTER — Encounter (HOSPITAL_COMMUNITY): Payer: Self-pay | Admitting: Licensed Clinical Social Worker

## 2012-03-01 ENCOUNTER — Encounter (HOSPITAL_COMMUNITY): Payer: Self-pay

## 2012-03-01 ENCOUNTER — Ambulatory Visit (HOSPITAL_COMMUNITY): Payer: Self-pay | Admitting: Licensed Clinical Social Worker

## 2012-03-01 NOTE — Progress Notes (Signed)
Patient ID: Nicole [name], female   DOB: 04-04-1940, 72 y.o.   MRN: [id number] Patient was  No show no call for today's appointment.

## 2012-03-08 ENCOUNTER — Ambulatory Visit (HOSPITAL_COMMUNITY): Payer: Self-pay | Admitting: Licensed Clinical Social Worker

## 2012-03-15 ENCOUNTER — Ambulatory Visit (INDEPENDENT_AMBULATORY_CARE_PROVIDER_SITE_OTHER): Payer: Medicare Other | Admitting: Licensed Clinical Social Worker

## 2012-03-15 DIAGNOSIS — F329 Major depressive disorder, single episode, unspecified: Secondary | ICD-10-CM

## 2012-03-15 NOTE — Progress Notes (Signed)
   THERAPIST PROGRESS NOTE  Session Time: 9:30am-10:20am  Participation Level: Active  Behavioral Response: Well GroomedAlertAnxious and Depressed  Type of Therapy: Individual Therapy  Treatment Goals addressed: Coping  Interventions: CBT, Motivational Interviewing, Strength-based, Supportive and Reframing  Summary: ANE CONERLY is a 72 y.o. female who presents with depressed mood and anxious affect. She expresses frustration with her daughter, questions the kind of treatment she is receiving and expresses self doubt over how to respond to her daughter when she is angry and takes it out on patient. She questions how she enables her daughters addiction and behavior. She questions if she is too controlling as her daughter accuses her of. She is not taking as good care of herself as she could be. Her sleep is disrupted by frequent urination. Her appetite is wnl. She continues to work and uses this as a primary tool to manage the stress in her life.    Suicidal/Homicidal: Nowithout intent/plan  Therapist Response: Assessed patients current functioning and reviewed progress. Reviewed coping strategies. Assessed patients safety and assisted in identifying protective factors.  Reviewed crisis plan with patient. Assisted patient with the expression of her feelings of frustration. Used CBT to assist patient with the identification of negative distortions and irrational thoughts. Encouraged patient to verbalize alternative and factual responses which challenge thought distortions. Reviewed healthy boundaries, assertive communication and ways to approach her daughter when blamed for her addiction. Patient struggles with strong codependent behavioral patterns. Reviewed patients self care plan. Assessed  progress related to self care. Patient's self care is good. Recommend proper diet, regular exercise, socialization and recreation.   Plan: Return again in two weeks.  Diagnosis: Axis I: Depressive  Disorder NOS    Axis II: No diagnosis    Laurann Mcmorris, LCSW 03/15/2012

## 2012-03-22 ENCOUNTER — Ambulatory Visit (INDEPENDENT_AMBULATORY_CARE_PROVIDER_SITE_OTHER): Payer: Medicare Other | Admitting: Licensed Clinical Social Worker

## 2012-03-22 DIAGNOSIS — F329 Major depressive disorder, single episode, unspecified: Secondary | ICD-10-CM

## 2012-03-22 NOTE — Progress Notes (Signed)
   THERAPIST PROGRESS NOTE  Session Time: 9:30am-10:20am  Participation Level: Active  Behavioral Response: Well GroomedAlertAnxious and Depressed  Type of Therapy: Individual Therapy  Treatment Goals addressed: Coping  Interventions: CBT, Motivational Interviewing, Solution Focused, Assertiveness Training, Supportive and Reframing  Summary: Nicole [name] is a 72 y.o. female who presents with depressed mood and anxious affect. She reports continued frustration with her daughter. She shows this Clinical research associate text messages from her daughter blaming her for her problems. Patient questions what role she has played in her daughters pain. She feels that she should continue to accept her daughters angry outbursts because her daughter does not have anyone else to talk to about this. She struggles to set appropriate limits and boundaries. Patient is tearful when processing her sadness that her relationship with her daughter is so broken. She continues to attend [facility] to help with her codependent relationship and behavioral patterns. Her sleep and appetite are wnl.    Suicidal/Homicidal: Nowithout intent/plan  Therapist Response: Assessed patients current functioning and reviewed progress. Reviewed coping strategies. Assessed patients safety and assisted in identifying protective factors.  Reviewed crisis plan with patient. Assisted patient with the expression of anxiety and frustration. Reviewed patients self care plan. Assessed progress related to self care. Patients self care is good. Recommend daily exercise, increased socialization and recreation. Used CBT to assist patient with the identification of negative distortions and irrational thoughts. Encouraged patient to verbalize alternative and factual responses which challenge thought distortions. Reviewed healthy boundaries and assertive communication. Used motivational interviewing to assist and encourage patient through the change process. Explored  patients barriers to change. Patient is challenged and resistant when discussing assertive communication with her daughter.   Plan: Return again in one to two weeks.  Diagnosis: Axis I: Depressive Disorder NOS    Axis II: No diagnosis    Bryant Saye, LCSW 03/22/2012

## 2012-03-29 ENCOUNTER — Ambulatory Visit (HOSPITAL_COMMUNITY): Payer: Self-pay | Admitting: Licensed Clinical Social Worker

## 2012-04-09 ENCOUNTER — Ambulatory Visit (HOSPITAL_COMMUNITY): Payer: Self-pay | Admitting: Psychiatry

## 2012-04-12 ENCOUNTER — Ambulatory Visit (INDEPENDENT_AMBULATORY_CARE_PROVIDER_SITE_OTHER): Payer: Medicare Other | Admitting: Licensed Clinical Social Worker

## 2012-04-12 DIAGNOSIS — F329 Major depressive disorder, single episode, unspecified: Secondary | ICD-10-CM

## 2012-04-12 NOTE — Progress Notes (Signed)
   THERAPIST PROGRESS NOTE  Session Time: 11:30am-12:20pm  Participation Level: Active  Behavioral Response: Well GroomedAlertAnxious and Depressed  Type of Therapy: Individual Therapy  Treatment Goals addressed: Coping  Interventions: CBT, Strength-based, Supportive and Reframing  Summary: Nicole [name] is a 72 y.o. female who presents with depressed mood and flat affect. She reports an increase in feelings of depression, with a lack of motivation, indifference towards things she typically cares about and increased isolation. She "hates" paying bills and has "just avoided" doing this and now there are over draft charges. She maintains two households because her daughter does not work. She has tried not to respond to her daughter in an effort to avoid further conflict. She does not feel supported by her husband and has to manage conflict between him and her daughter. She is tired of helping, but is unable to stop. She understands that she needs to feel needed. Her sleep is poor and her appetite is wnl.    Suicidal/Homicidal: Nowithout intent/plan  Therapist Response: Assessed patients current functioning and reviewed progress. Reviewed coping strategies. Assessed patients safety and assisted in identifying protective factors.  Reviewed crisis plan with patient. Assisted patient with the expression of sadness and frustration. Reviewed patients self care plan. Assessed progress related to self care. Patients self care is good. Recommend daily exercise, increased socialization and recreation. Used CBT to assist patient with the identification of negative distortions and irrational thoughts. Encouraged patient to verbalize alternative and factual responses which challenge thought distortions. Reviewed healthy boundaries and assertive communication. Used motivational interviewing to assist and encourage patient through the change process. Explored patients barriers to change. Patient demonstrates  difficulty remaining with her feelings of sadness in session. She often tries to avoid and steer the session in a different direction.   Plan: Return again in two weeks.  Diagnosis: Axis I: Depressive Disorder NOS    Axis II: No diagnosis    Jacquese Cassarino, LCSW 04/12/2012

## 2012-04-15 ENCOUNTER — Other Ambulatory Visit (HOSPITAL_COMMUNITY): Payer: Self-pay | Admitting: Psychiatry

## 2012-04-15 ENCOUNTER — Telehealth (HOSPITAL_COMMUNITY): Payer: Self-pay | Admitting: Psychiatry

## 2012-04-15 NOTE — Telephone Encounter (Signed)
Call returned.  Patient is experiencing increased anxiety and depression.  She has appointment in may.  She like to come sooner.  We'll schedule her appointment sooner than May.

## 2012-04-18 ENCOUNTER — Encounter (HOSPITAL_COMMUNITY): Payer: Self-pay | Admitting: Psychiatry

## 2012-04-18 ENCOUNTER — Ambulatory Visit (INDEPENDENT_AMBULATORY_CARE_PROVIDER_SITE_OTHER): Payer: Medicare Other | Admitting: Psychiatry

## 2012-04-18 VITALS — BP 112/68 | HR 66 | Wt 126.0 lb

## 2012-04-18 DIAGNOSIS — F329 Major depressive disorder, single episode, unspecified: Secondary | ICD-10-CM

## 2012-04-18 DIAGNOSIS — I10 Essential (primary) hypertension: Secondary | ICD-10-CM

## 2012-04-18 MED ORDER — TRAZODONE HCL 50 MG PO TABS: 50.0000 mg | ORAL_TABLET | Freq: Every day | ORAL | Status: DC

## 2012-04-18 MED ORDER — FLUOXETINE HCL 20 MG PO CAPS: 20.0000 mg | ORAL_CAPSULE | Freq: Every day | ORAL | Status: DC
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
## 2012-04-25 ENCOUNTER — Ambulatory Visit (HOSPITAL_COMMUNITY): Payer: Self-pay | Admitting: Psychiatry

## 2012-04-26 ENCOUNTER — Ambulatory Visit (HOSPITAL_COMMUNITY): Payer: Self-pay | Admitting: Licensed Clinical Social Worker

## 2012-05-01 ENCOUNTER — Other Ambulatory Visit: Payer: Self-pay | Admitting: Family Medicine

## 2012-05-01 ENCOUNTER — Ambulatory Visit
Admission: RE | Admit: 2012-05-01 | Discharge: 2012-05-01 | Disposition: A | Discharge status: Home / Self Care | Payer: Medicare Other | Source: Ambulatory Visit | Attending: Family Medicine | Admitting: Family Medicine

## 2012-05-01 DIAGNOSIS — R42 Dizziness and giddiness: Secondary | ICD-10-CM

## 2012-05-01 DIAGNOSIS — W19XXXD Unspecified fall, subsequent encounter: Secondary | ICD-10-CM

## 2012-05-03 ENCOUNTER — Ambulatory Visit (HOSPITAL_COMMUNITY): Payer: Self-pay | Admitting: Licensed Clinical Social Worker

## 2012-05-06 ENCOUNTER — Other Ambulatory Visit (HOSPITAL_COMMUNITY): Payer: Self-pay | Admitting: Psychiatry

## 2012-05-14 ENCOUNTER — Other Ambulatory Visit (HOSPITAL_COMMUNITY): Payer: Self-pay | Admitting: Psychiatry

## 2012-05-15 ENCOUNTER — Other Ambulatory Visit (HOSPITAL_COMMUNITY): Payer: Self-pay | Admitting: Psychiatry

## 2012-05-15 NOTE — Telephone Encounter (Signed)
Patient was given new script on 04/18/12 with one additional refill.

## 2012-05-28 ENCOUNTER — Ambulatory Visit (HOSPITAL_COMMUNITY): Payer: Self-pay | Admitting: Psychiatry

## 2012-05-31 ENCOUNTER — Ambulatory Visit (INDEPENDENT_AMBULATORY_CARE_PROVIDER_SITE_OTHER): Payer: No Typology Code available for payment source | Admitting: Psychiatry

## 2012-05-31 ENCOUNTER — Encounter (HOSPITAL_COMMUNITY): Payer: Self-pay | Admitting: Psychiatry

## 2012-05-31 VITALS — BP 110/70 | HR 66 | Wt 126.0 lb

## 2012-05-31 DIAGNOSIS — F329 Major depressive disorder, single episode, unspecified: Secondary | ICD-10-CM

## 2012-05-31 DIAGNOSIS — F1010 Alcohol abuse, uncomplicated: Secondary | ICD-10-CM

## 2012-05-31 MED ORDER — FLUOXETINE HCL 10 MG PO CAPS: ORAL_CAPSULE | ORAL | Status: DC
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
## 2012-06-19 ENCOUNTER — Ambulatory Visit (INDEPENDENT_AMBULATORY_CARE_PROVIDER_SITE_OTHER): Payer: No Typology Code available for payment source | Admitting: Psychiatry

## 2012-06-19 ENCOUNTER — Encounter (HOSPITAL_COMMUNITY): Payer: Self-pay | Admitting: Psychiatry

## 2012-06-19 VITALS — Wt 128.0 lb

## 2012-06-19 DIAGNOSIS — F1010 Alcohol abuse, uncomplicated: Secondary | ICD-10-CM

## 2012-06-19 DIAGNOSIS — F329 Major depressive disorder, single episode, unspecified: Secondary | ICD-10-CM

## 2012-06-19 MED ORDER — FLUOXETINE HCL 10 MG PO CAPS: ORAL_CAPSULE | ORAL | Status: DC
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
## 2012-06-26 ENCOUNTER — Ambulatory Visit (HOSPITAL_COMMUNITY): Payer: Self-pay | Admitting: Psychiatry

## 2012-08-12 ENCOUNTER — Telehealth (HOSPITAL_COMMUNITY): Payer: Self-pay | Admitting: *Deleted

## 2012-08-12 ENCOUNTER — Other Ambulatory Visit (HOSPITAL_COMMUNITY): Payer: Self-pay | Admitting: Psychiatry

## 2012-08-12 DIAGNOSIS — F329 Major depressive disorder, single episode, unspecified: Secondary | ICD-10-CM

## 2012-08-12 MED ORDER — CLONAZEPAM 0.5 MG PO TABS: 0.5000 mg | ORAL_TABLET | Freq: Every day | ORAL | Status: DC | PRN

## 2012-08-12 NOTE — Addendum Note (Signed)
Addended by: Tonny Bollman on: 08/12/2012 05:29 PM   Modules accepted: Orders, Medications

## 2012-08-12 NOTE — Telephone Encounter (Signed)
VM: Patient requests MD be given this message Pt states she was taking Klonopin. She ran out. Can't get anymore because prescription expired. She is not feeling well. Does not know if because of no Klonopin or not. Could she have refill?

## 2012-08-12 NOTE — Telephone Encounter (Addendum)
Per Dr. Lolly Mustache orders:  Klonopin 0.5mg  once daily as needed for anxiety, #30, No additional refills.Called to pharmacy Pt notified by phone message

## 2012-08-16 ENCOUNTER — Telehealth (HOSPITAL_COMMUNITY): Payer: Self-pay

## 2012-08-16 ENCOUNTER — Encounter (HOSPITAL_COMMUNITY): Payer: Self-pay | Admitting: Psychiatry

## 2012-08-16 ENCOUNTER — Ambulatory Visit (INDEPENDENT_AMBULATORY_CARE_PROVIDER_SITE_OTHER): Payer: Medicare Other | Admitting: Psychiatry

## 2012-08-16 VITALS — BP 120/80 | HR 60 | Ht 62.0 in | Wt 129.4 lb

## 2012-08-16 DIAGNOSIS — F1010 Alcohol abuse, uncomplicated: Secondary | ICD-10-CM

## 2012-08-16 DIAGNOSIS — F329 Major depressive disorder, single episode, unspecified: Secondary | ICD-10-CM

## 2012-08-16 MED ORDER — TEMAZEPAM 7.5 MG PO CAPS: 7.5000 mg | ORAL_CAPSULE | Freq: Every evening | ORAL | Status: DC | PRN

## 2012-08-16 MED ORDER — LAMOTRIGINE 25 MG PO TABS: ORAL_TABLET | ORAL | Status: DC

## 2012-08-16 NOTE — Telephone Encounter (Signed)
08/16/12 4:25pm Patient stated that the medication (RESTORIL 7.5MG ) pt stated that medication $229.00 need to speak with you about changing the mg  because the 15mg  would cost $40.00.Marland KitchenMarguerite Olea
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
## 2012-08-19 ENCOUNTER — Telehealth (HOSPITAL_COMMUNITY): Payer: Self-pay | Admitting: *Deleted

## 2012-08-19 NOTE — Telephone Encounter (Signed)
Message routed to Dr. Lolly Mustache regarding pt's Restoril script.

## 2012-08-19 NOTE — Telephone Encounter (Signed)
Pt's pharmact called ([facility] [phone]) regarding pt's Restoril script.  7.5 mg dose is $229 per month. 15mg  dose would only be $15 per month.  Can the pharmacy make that adjustment?  The pt would need education to take half the pill at night.  Please advise.

## 2012-08-21 ENCOUNTER — Telehealth (HOSPITAL_COMMUNITY): Payer: Self-pay | Admitting: *Deleted

## 2012-08-21 ENCOUNTER — Other Ambulatory Visit (HOSPITAL_COMMUNITY): Payer: Self-pay | Admitting: *Deleted

## 2012-08-21 ENCOUNTER — Telehealth (HOSPITAL_COMMUNITY): Payer: Self-pay | Admitting: Psychiatry

## 2012-08-21 DIAGNOSIS — F329 Major depressive disorder, single episode, unspecified: Secondary | ICD-10-CM

## 2012-08-21 MED ORDER — TEMAZEPAM 15 MG PO CAPS: 15.0000 mg | ORAL_CAPSULE | Freq: Every evening | ORAL | Status: DC | PRN

## 2012-08-21 NOTE — Telephone Encounter (Signed)
Per Dr Lolly Mustache order was called in for Restoril 15mg  qhs prn to patients pharmacy. Pharmacist states that patient had not picked up Restoril 7.5mg , this writer asked them to remove it from the "hold" and change order to 15mg . Pt was called and notified of the change and that it has been called in to her pharmacy. Pt appreciative.

## 2012-08-21 NOTE — Telephone Encounter (Signed)
Called to notify patient that her Restoril dosage has been increased to 15mg  per Dr. Lolly Mustache and has been called into her pharmacy. Pt appreciative.

## 2012-08-21 NOTE — Telephone Encounter (Signed)
Left message to call us back

## 2012-08-21 NOTE — Telephone Encounter (Signed)
Call return.  The patient no longer afford Restoril 7.5 mg.  She like to try 50 mg.  We will authorize 15 mg temazepam one at bedtime.  Recommend to call us back if she is a question or concern that if she feels any side effects.

## 2012-09-02 ENCOUNTER — Telehealth (HOSPITAL_COMMUNITY): Payer: Self-pay | Admitting: *Deleted

## 2012-09-02 NOTE — Telephone Encounter (Signed)
Patient left message: Over weekend, had some kind of allergic reaction, broke out head to toe, looked like the measles. Saw Dr. Doristine Locks, primary care, who told her to stop the Restoril, and told her to call you on Monday Pt states she thinks she is okay on Lamictal (recently increased dose) Should she stay off Restoril or change anything else?

## 2012-09-03 ENCOUNTER — Telehealth (HOSPITAL_COMMUNITY): Payer: Self-pay | Admitting: Psychiatry

## 2012-09-03 NOTE — Telephone Encounter (Signed)
Left message to call us back regarding medication reaction.

## 2012-09-03 NOTE — Telephone Encounter (Signed)
Contacted pt at Dr. Sheela Stack request with orders: Stop taking Lamictal. Contact office when rash is completely gone. May resume Restoril as not likely cause of rash.  Pt verbalized understanding of instructions. States rash is still on thighs, but fading otherwise. Thinks the cortisone shot she received on Friday from primary MD helped.

## 2012-09-11 ENCOUNTER — Telehealth (HOSPITAL_COMMUNITY): Payer: Self-pay

## 2012-09-13 ENCOUNTER — Ambulatory Visit (INDEPENDENT_AMBULATORY_CARE_PROVIDER_SITE_OTHER): Payer: No Typology Code available for payment source | Admitting: Psychiatry

## 2012-09-13 VITALS — BP 120/82 | HR 80 | Wt 128.0 lb

## 2012-09-13 DIAGNOSIS — F1010 Alcohol abuse, uncomplicated: Secondary | ICD-10-CM

## 2012-09-13 DIAGNOSIS — F329 Major depressive disorder, single episode, unspecified: Secondary | ICD-10-CM

## 2012-09-13 MED ORDER — FLUOXETINE HCL 10 MG PO CAPS: ORAL_CAPSULE | ORAL | Status: DC

## 2012-09-14 ENCOUNTER — Encounter (HOSPITAL_COMMUNITY): Payer: Self-pay | Admitting: Psychiatry

## 2012-09-14 MED ORDER — ARIPIPRAZOLE 2 MG PO TABS: 2.0000 mg | ORAL_TABLET | Freq: Every day | ORAL | Status: DC
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
## 2012-09-30 ENCOUNTER — Other Ambulatory Visit: Payer: Self-pay | Admitting: *Deleted

## 2012-09-30 MED ORDER — LOSARTAN POTASSIUM 100 MG PO TABS: 100.0000 mg | ORAL_TABLET | Freq: Every day | ORAL | Status: DC

## 2012-09-30 NOTE — Telephone Encounter (Signed)
Rx was sent to pharmacy electronically. 

## 2012-10-04 ENCOUNTER — Other Ambulatory Visit (HOSPITAL_COMMUNITY): Payer: Self-pay | Admitting: Psychiatry

## 2012-10-04 DIAGNOSIS — F329 Major depressive disorder, single episode, unspecified: Secondary | ICD-10-CM

## 2012-10-15 ENCOUNTER — Encounter (HOSPITAL_COMMUNITY): Payer: Self-pay | Admitting: Psychiatry

## 2012-10-15 ENCOUNTER — Ambulatory Visit (INDEPENDENT_AMBULATORY_CARE_PROVIDER_SITE_OTHER): Payer: No Typology Code available for payment source | Admitting: Psychiatry

## 2012-10-15 VITALS — BP 132/78 | HR 80 | Ht 62.0 in

## 2012-10-15 DIAGNOSIS — F329 Major depressive disorder, single episode, unspecified: Secondary | ICD-10-CM

## 2012-10-15 DIAGNOSIS — F1010 Alcohol abuse, uncomplicated: Secondary | ICD-10-CM

## 2012-10-15 MED ORDER — FLUOXETINE HCL 40 MG PO CAPS: 40.0000 mg | ORAL_CAPSULE | Freq: Every day | ORAL | Status: DC

## 2012-10-15 MED ORDER — TEMAZEPAM 15 MG PO CAPS: ORAL_CAPSULE | ORAL | Status: DC
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
## 2012-10-20 ENCOUNTER — Encounter (HOSPITAL_COMMUNITY): Payer: Self-pay | Admitting: *Deleted

## 2012-10-20 ENCOUNTER — Emergency Department (HOSPITAL_COMMUNITY)
Admission: EM | Admit: 2012-10-20 | Discharge: 2012-10-20 | Disposition: A | Discharge status: Home / Self Care | Payer: Medicare Other | Source: Home / Self Care | Attending: Family Medicine | Admitting: Family Medicine

## 2012-10-20 ENCOUNTER — Emergency Department (INDEPENDENT_AMBULATORY_CARE_PROVIDER_SITE_OTHER): Payer: Medicare Other

## 2012-10-20 DIAGNOSIS — S52599A Other fractures of lower end of unspecified radius, initial encounter for closed fracture: Secondary | ICD-10-CM

## 2012-10-20 DIAGNOSIS — S52502A Unspecified fracture of the lower end of left radius, initial encounter for closed fracture: Secondary | ICD-10-CM

## 2012-10-20 MED ORDER — HYDROCODONE-ACETAMINOPHEN 5-325 MG PO TABS: 1.0000 | ORAL_TABLET | Freq: Four times a day (QID) | ORAL | Status: DC | PRN

## 2012-10-20 NOTE — ED Provider Notes (Signed)
CSN: [id number]     Arrival date & time 10/20/12  1112 History   None    Chief Complaint  Patient presents with  . Wrist Pain   (Consider location/radiation/quality/duration/timing/severity/associated sxs/prior Treatment) HPI Comments: 72 year old female presents complaining of FOOSH last night. Fell in restroom. She has Pain and swelling in the distal left forearm. Bruising as well. The area is very tender to touch. She denies any numbness distal to this injury and has poor range of motion in her hand. Movement of the wrist causes pain in the forearm. She denies any other injury and denies loss of consciousness.  Patient is a 72 y.o. female presenting with wrist pain.  Wrist Pain Pertinent negatives include no chest pain, no abdominal pain and no shortness of breath.    Past Medical History  Diagnosis Date  . HTN (hypertension)   . Depression   . Anxiety    Past Surgical History  Procedure Laterality Date  . Rotator cuff repair    . Breast surgery    . Cholecystectomy    . Fracture surgery     Family History  Problem Relation Age of Onset  . Depression Mother   . Depression Father   . Drug abuse Daughter   . Depression Daughter    History  Substance Use Topics  . Smoking status: Never Smoker   . Smokeless tobacco: Not on file  . Alcohol Use: 1.2 oz/week    2 Glasses of wine per week     Comment: occasionally   OB History   Grav Para Term Preterm Abortions TAB SAB Ect Mult Living                 Review of Systems  Constitutional: Positive for fatigue (was up late due to arm pain). Negative for fever and chills.  Eyes: Negative for visual disturbance.  Respiratory: Negative for cough and shortness of breath.   Cardiovascular: Negative for chest pain, palpitations and leg swelling.  Gastrointestinal: Negative for nausea, vomiting and abdominal pain.  Endocrine: Negative for polydipsia and polyuria.  Genitourinary: Negative for dysuria, urgency and frequency.   Musculoskeletal:       See history of present illness  Skin: Negative for rash.  Neurological: Negative for dizziness, weakness and light-headedness.    Allergies  Statins and Lamictal  Home Medications   Current Outpatient Rx  Name  Route  Sig  Dispense  Refill  . FLUoxetine (PROZAC) 40 MG capsule   Oral   Take 1 capsule (40 mg total) by mouth daily.   30 capsule   1   . losartan (COZAAR) 100 MG tablet   Oral   Take 1 tablet (100 mg total) by mouth daily.   30 tablet   1     Patient needs to schedule appointment for future r ...   . metoprolol (LOPRESSOR) 50 MG tablet   Oral   Take 50 mg by mouth 2 (two) times daily.         . temazepam (RESTORIL) 15 MG capsule      TAKE ONE CAPSULE BY MOUTH AT BEDTIME AS NEEDED FOR SLEEP   30 capsule   1   . ARIPiprazole (ABILIFY) 2 MG tablet   Oral   Take 1 tablet (2 mg total) by mouth daily.   28 tablet   0   . HYDROcodone-acetaminophen (NORCO) 5-325 MG per tablet   Oral   Take 1 tablet by mouth every 6 (six) hours as needed for  pain.   30 tablet   0    BP 216/90  Pulse 46  Temp(Src) 98.4 F (36.9 C) (Oral)  Resp 18  SpO2 96% Physical Exam  Nursing note and vitals reviewed. Constitutional: She is oriented to person, place, and time. Vital signs are normal. She appears well-developed and well-nourished. No distress.  HENT:  Head: Normocephalic and atraumatic.  Pulmonary/Chest: Effort normal. No respiratory distress.  Musculoskeletal:       Left forearm: She exhibits tenderness, bony tenderness and swelling. She exhibits no edema, no deformity and no laceration.  Swelling, ecchymosis, TTP of distal left forearm.  No obvious deformity  Neurological: She is alert and oriented to person, place, and time. She has normal strength. Coordination normal.  Skin: Skin is warm and dry. No rash noted. She is not diaphoretic.  Psychiatric: She has a normal mood and affect. Judgment normal.    ED Course  Procedures  (including critical care time) Labs Review Labs Reviewed - No data to display Imaging Review Dg Forearm Left  10/20/2012   CLINICAL DATA:  Larey Seat in bathroom last night onto left wrist, swelling and discoloration at left hand, wrist and distal forearm, initial encounter  EXAM: LEFT FOREARM - 2 VIEW  COMPARISON:  None.  FINDINGS: Diffuse osseous demineralization.  Transverse nondisplaced distal left radial metaphyseal fracture as noted on wrist radiographs.  Wrist and elbow joint alignments normal.  No additional fracture, dislocation, or bone destruction.  IMPRESSION: Transverse nondisplaced distal left radial metaphyseal fracture.  Mild osseous demineralization.   Electronically Signed   By: Ulyses Southward M.D.   On: 10/20/2012 13:29   Dg Wrist Complete Left  10/20/2012   CLINICAL DATA:  Larey Seat in bathroom last night onto left wrist, swelling and discoloration at left hand, left wrist and distal forearm, initial encounter  EXAM: LEFT WRIST - COMPLETE 3+ VIEW  COMPARISON:  None  FINDINGS: Osseous demineralization.  Transverse nondisplaced distal left radial metaphyseal fracture with neutral tilt of distal right articular surface.  Associated soft tissue swelling.  Joint spaces preserved.  No additional fracture, dislocation, or bone destruction.  IMPRESSION: Nondisplaced transverse distal left radial metaphyseal fracture with neutral tilt of distal radial articular surface.   Electronically Signed   By: Ulyses Southward M.D.   On: 10/20/2012 13:27    MDM   1. Distal radial fracture, left, closed, initial encounter    Transverse, nondisplaced fracture of the distal left radius. Patient placed in a sugar tong splint. Referred to orthopedics, she will followup within the week. Hydrocodone as needed for pain.   Meds ordered this encounter  Medications  . HYDROcodone-acetaminophen (NORCO) 5-325 MG per tablet    Sig: Take 1 tablet by mouth every 6 (six) hours as needed for pain.    Dispense:  30 tablet     Refill:  0    Order Specific Question:  Supervising Provider    Answer:  Lorenz Coaster, DAVID C [[id number]]     Graylon Good, PA-C 10/20/12 2046

## 2012-10-20 NOTE — ED Notes (Addendum)
Pt fell in her bathroom last night onto her left wrist.  Swelling and discoloration noted left hand, wrist and distal forearm

## 2012-10-22 NOTE — ED Provider Notes (Signed)
Medical screening examination/treatment/procedure(s) were performed by a resident physician or non-physician practitioner and as the supervising physician I was immediately available for consultation/collaboration.  Zuleika Gallus, MD    Khyler Urda S Shann Merrick, MD 10/22/12 0742 

## 2012-12-05 ENCOUNTER — Other Ambulatory Visit: Payer: Self-pay

## 2012-12-05 DIAGNOSIS — Z1231 Encounter for screening mammogram for malignant neoplasm of breast: Secondary | ICD-10-CM

## 2012-12-16 ENCOUNTER — Ambulatory Visit (HOSPITAL_COMMUNITY): Payer: Self-pay | Admitting: Psychiatry

## 2012-12-17 ENCOUNTER — Ambulatory Visit (INDEPENDENT_AMBULATORY_CARE_PROVIDER_SITE_OTHER): Payer: Medicare Other | Admitting: Psychiatry

## 2012-12-17 ENCOUNTER — Encounter (INDEPENDENT_AMBULATORY_CARE_PROVIDER_SITE_OTHER): Payer: Self-pay

## 2012-12-17 ENCOUNTER — Encounter (HOSPITAL_COMMUNITY): Payer: Self-pay | Admitting: Psychiatry

## 2012-12-17 DIAGNOSIS — F3289 Other specified depressive episodes: Secondary | ICD-10-CM

## 2012-12-17 DIAGNOSIS — F1010 Alcohol abuse, uncomplicated: Secondary | ICD-10-CM

## 2012-12-17 DIAGNOSIS — F329 Major depressive disorder, single episode, unspecified: Secondary | ICD-10-CM

## 2012-12-17 MED ORDER — FLUOXETINE HCL 40 MG PO CAPS: 40.0000 mg | ORAL_CAPSULE | Freq: Every day | ORAL | Status: DC
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
## 2013-01-09 ENCOUNTER — Other Ambulatory Visit: Payer: Self-pay | Admitting: Internal Medicine

## 2013-01-10 NOTE — Telephone Encounter (Signed)
Rx was sent to pharmacy electronically. 

## 2013-01-13 ENCOUNTER — Ambulatory Visit
Admission: RE | Admit: 2013-01-13 | Discharge: 2013-01-13 | Disposition: A | Discharge status: Home / Self Care | Payer: Medicare Other | Source: Ambulatory Visit

## 2013-01-13 DIAGNOSIS — Z1231 Encounter for screening mammogram for malignant neoplasm of breast: Secondary | ICD-10-CM

## 2013-01-14 ENCOUNTER — Other Ambulatory Visit (HOSPITAL_COMMUNITY): Payer: Self-pay | Admitting: Psychiatry

## 2013-01-18 ENCOUNTER — Other Ambulatory Visit (HOSPITAL_COMMUNITY): Payer: Self-pay | Admitting: Psychiatry

## 2013-02-02 ENCOUNTER — Encounter: Payer: Self-pay | Admitting: *Deleted

## 2013-02-04 ENCOUNTER — Encounter: Payer: Self-pay | Admitting: Internal Medicine

## 2013-02-05 ENCOUNTER — Ambulatory Visit: Payer: Medicare Other | Admitting: Internal Medicine

## 2013-02-07 ENCOUNTER — Encounter: Payer: Self-pay | Admitting: Internal Medicine

## 2013-02-07 ENCOUNTER — Ambulatory Visit (INDEPENDENT_AMBULATORY_CARE_PROVIDER_SITE_OTHER): Payer: Medicare Other | Admitting: Internal Medicine

## 2013-02-07 ENCOUNTER — Other Ambulatory Visit: Payer: Self-pay | Admitting: Internal Medicine

## 2013-02-07 VITALS — BP 162/80 | HR 47 | Ht 63.0 in | Wt 130.8 lb

## 2013-02-07 DIAGNOSIS — Z789 Other specified health status: Secondary | ICD-10-CM | POA: Insufficient documentation

## 2013-02-07 DIAGNOSIS — Z888 Allergy status to other drugs, medicaments and biological substances status: Secondary | ICD-10-CM

## 2013-02-07 DIAGNOSIS — K7689 Other specified diseases of liver: Secondary | ICD-10-CM

## 2013-02-07 DIAGNOSIS — K760 Fatty (change of) liver, not elsewhere classified: Secondary | ICD-10-CM

## 2013-02-07 DIAGNOSIS — K219 Gastro-esophageal reflux disease without esophagitis: Secondary | ICD-10-CM

## 2013-02-07 DIAGNOSIS — E785 Hyperlipidemia, unspecified: Secondary | ICD-10-CM

## 2013-02-07 DIAGNOSIS — I10 Essential (primary) hypertension: Secondary | ICD-10-CM

## 2013-02-07 DIAGNOSIS — R0789 Other chest pain: Secondary | ICD-10-CM

## 2013-02-07 MED ORDER — VALSARTAN 160 MG PO TABS: 160.0000 mg | ORAL_TABLET | Freq: Every day | ORAL | Status: DC

## 2013-02-07 MED ORDER — METOPROLOL SUCCINATE ER 25 MG PO TB24: 25.0000 mg | ORAL_TABLET | Freq: Every day | ORAL | Status: DC

## 2013-02-07 NOTE — Progress Notes (Signed)
OFFICE NOTE  Chief Complaint:  GERD, atypical CP  Primary Care Physician: Osborne Casco, MD  HPI:  Nicole [name] is a 73 year old female who was recently followed by Dr. Rex Kras and was last seen in August of 2013. HEENT follow her for a history of hypertension and dyslipidemia. [name] was noted to have statin intolerance in the past and has failed both Crestor, Lipitor and Zocor. She also reportedly had problems with HCTZ causing cramps in her legs. She was recently apparently started on Toprol-XL for hypertension, but is noted to be bradycardic in the office today with a pulse rate of 47. Her blood pressure is still elevated at 162/80 and was 162/100 and office in 2013. She denies any chest pain but recently has had some problems with indigestion which is improved on Nexium.  She is taking WelChol for her abnormal cholesterol as she has a history of nonalcoholic fatty liver disease with elevated liver enzymes in the past on statins. He has not tried Investment banker, corporate or Zetia.  She says that she did have a prescription for Zetia, but did not take it because she said she could not take statins.  I did rearrange today that Zetia is not a statin, but can affect liver enzymes.  PMHx:  Past Medical History  Diagnosis Date  . HTN (hypertension)   . Increased liver enzymes   . Hyperlipemia   . GERD (gastroesophageal reflux disease)   . Diabetes     Past Surgical History  Procedure Laterality Date  . Rotator cuff repair    . Breast surgery    . Cholecystectomy    . Fracture surgery    . Foot surgery    . Hip surgery    . Cardiovascular stress test  05/1998    breast attenuation in anterior septal wall, EF 63%    FAMHx:  Family History  Problem Relation Age of Onset  . Depression Mother     stroke  . Depression Father     emphysema  . Drug abuse Daughter   . Depression Daughter   . Heart attack Brother 18    SOCHx:   reports that she has never smoked. She has never  used smokeless tobacco. She reports that she drinks about 1.2 ounces of alcohol per week. She reports that she does not use illicit drugs.  ALLERGIES:  Allergies  Allergen Reactions  . Statins Other (See Comments)    Muscle aches  . Lamictal [Lamotrigine] Rash    ROS: A comprehensive review of systems was negative except for: Cardiovascular: positive for dyspnea Neurological: positive for dizziness  HOME MEDS: Current Outpatient Prescriptions  Medication Sig Dispense Refill  . CALCIUM PO Take by mouth daily.      . Cholecalciferol (VITAMIN D PO) Take by mouth once a week.      . ClonazePAM (KLONOPIN PO) Take 0.5 tablets by mouth at bedtime.      . colesevelam (WELCHOL) 625 MG tablet Take 1,250 mg by mouth 2 (two) times daily with a meal.      . esomeprazole (NEXIUM) 40 MG capsule Take 40 mg by mouth daily at 12 noon.      [name] FLUoxetine (PROZAC) 40 MG capsule Take 1 capsule (40 mg total) by mouth daily.  30 capsule  2  . ibuprofen (ADVIL,MOTRIN) 800 MG tablet Take 400 mg by mouth 2 (two) times daily as needed.      [name] levothyroxine (SYNTHROID, LEVOTHROID) 25 MCG tablet Take 25 mcg  by mouth daily before breakfast.      . metoprolol succinate (TOPROL-XL) 25 MG 24 hr tablet Take 1 tablet (25 mg total) by mouth daily. Take with or immediately following a meal.  30 tablet  0  . Multiple Vitamin (MULTIVITAMIN) capsule Take 1 capsule by mouth daily.      . valsartan (DIOVAN) 160 MG tablet Take 1 tablet (160 mg total) by mouth daily.  30 tablet  6   No current facility-administered medications for this visit.    LABS/IMAGING: No results found for this or any previous visit (from the past 48 hour(s)). No results found.  VITALS: BP 162/80  Pulse 47  Ht 5\' 3"  (1.6 m)  Wt 130 lb 12.8 oz (59.33 kg)  BMI 23.18 kg/m2  EXAM: General appearance: alert and no distress Neck: no carotid bruit and no JVD Lungs: clear to auscultation bilaterally Heart: regular rate and rhythm, S1, S2 normal, no  murmur, click, rub or gallop Abdomen: soft, non-tender; bowel sounds normal; no masses,  no organomegaly Extremities: extremities normal, atraumatic, no cyanosis or edema Pulses: 2+ and symmetric Skin: Skin color, texture, turgor normal. No rashes or lesions Neurologic: Grossly normal Psych: Mood, affect normal  EKG: Marked sinus bradycardia at 47, incomplete right bundle branch block  ASSESSMENT: 1. Symptomatic bradycardia 2. Dyspnea on exertion/fatigue 3. Hypertension-uncontrolled 4. GERD 5. Dyslipidemia-intolerant to statins 6. NAFLD  PLAN: 1.   [name] to be more optimally treated with regards to her blood pressure. I did ask about why she was started on Toprol, as there was a history of bradycardia in the past. There was one office visit here where her heart rate was elevated for some unknown reason, but looking back generally her heart rate tends to run in the 50s and 60s without any beta blocker or negative chronotropes. She has been having problems with fatigue, and has been slightly improved while taking vitamin D, but I believe that bradycardia is playing a role. I would recommend decreasing her Toprol-XL to 25 mg daily. I will also stop her losartan and switch her to Diovan 160 mg daily. Plan is to have her followup with Erasmo Downer, our hypertension pharmacist in a few weeks to see if her blood pressure has improved. I would like to wean her off of Toprol completely if possible with concomitant increases in her blood pressure.  Remembering that she did have cramps and electrolyte problems on HCTZ. Finally, we should consider another attempt at treating her with statins. One option may be Livalo, which is minimally metabolized in the liver.  If her liver enzymes are stable on this medication or not increased to 3 times the upper limit of normal, then it could be successfully used. She wishes to consider trying this in the future as it is difficult to swallow her WelChol and take it  very frequently.  Pixie Casino, MD, [facility] Attending Cardiologist [facility]  [place] C 02/07/2013, 2:01 PM

## 2013-02-07 NOTE — Patient Instructions (Signed)
Your physician has recommended you make the following change in your medication: STOP Losartan. START valsartan 160mg  daily.  Decrease Toprol XL to 25mg  once daily. Dr. Debara Pickett would like to you to come off this medication.  Your physician wants you to follow-up in: 6 months with Dr. Debara Pickett. You will receive a reminder letter in the mail two months in advance. If you don't receive a letter, please call our office to schedule the follow-up appointment.  Your physician recommends that you schedule a follow-up appointment in: 2 weeks with Erasmo Downer.

## 2013-02-20 ENCOUNTER — Telehealth: Payer: Self-pay | Admitting: *Deleted

## 2013-02-20 DIAGNOSIS — R252 Cramp and spasm: Secondary | ICD-10-CM

## 2013-02-20 DIAGNOSIS — Z79899 Other long term (current) drug therapy: Secondary | ICD-10-CM

## 2013-02-20 NOTE — Telephone Encounter (Signed)
Returned call and pt verified x 2.  Pt stated she is having leg cramps since he changed the medication.  Stated the cramps started about a week and a half ago.  Stated her legs cramp up and knot up.  Stated sometimes her leg turns brown.  Pt wanted to let Dr. Debara Pickett know and to find out if her potassium may be low.  Pt informed both losartan and valsartan have a K+ component.  Chart reviewed and no recent labs.  Pt denied having recent labs w/ PCP either.  Pt also wants to know how to continue weaning off metoprolol.  Stated she is taking it every other day now.  Pt informed Dr. Debara Pickett will be notified for further instructions.  Pt verbalized understanding and agreed w/ plan.     Message forwarded to Dr. Debara Pickett.  1) Leg cramps 2) When does pt stop metoprolol?

## 2013-02-20 NOTE — Telephone Encounter (Signed)
Pt stated that Dr. Debara Pickett changed her medication and now is experiencing leg cramps.   Nicole [name]

## 2013-02-21 ENCOUNTER — Ambulatory Visit (INDEPENDENT_AMBULATORY_CARE_PROVIDER_SITE_OTHER): Payer: Medicare Other | Admitting: Pharmacist Clinician (PhC)/ Clinical Pharmacy Specialist

## 2013-02-21 VITALS — BP 160/86 | HR 60 | Ht 63.0 in | Wt 130.4 lb

## 2013-02-21 DIAGNOSIS — E785 Hyperlipidemia, unspecified: Secondary | ICD-10-CM

## 2013-02-21 DIAGNOSIS — I10 Essential (primary) hypertension: Secondary | ICD-10-CM

## 2013-02-21 NOTE — Telephone Encounter (Signed)
Stay on metoprolol until she sees Erasmo Downer - which is scheduled for today - may  Want to remind her of her appointment. I'm okay with checking a BMP and magnesium.  She could take slow-mag OTC for cramps.  -Dr. Debara Pickett

## 2013-02-21 NOTE — Patient Instructions (Addendum)
Return in 1 month for blood pressure check  Your blood pressure today is 160/86  Check your blood pressure at home daily (if able) and keep record of the readings.  Increase valsartan to 2 tablets (320mg ) each morning Decrease metoprolol to 1/2 tablet for 10 days then discontinue  Start Livalo 2mg  each day.   Get blood work done in about 2 weeks for liver function   Bring all of your meds, your BP cuff and your record of home blood pressures to your next appointment.  Exercise as you're able, try to walk approximately 30 minutes per day.  Keep salt intake to a minimum, especially watch canned and prepared boxed foods.  Eat more fresh fruits and vegetables and fewer canned items.  Avoid eating in fast food restaurants.   HOW TO TAKE YOUR BLOOD PRESSURE:   Rest 5 minutes before taking your blood pressure.    Don't smoke or drink caffeinated beverages for at least 30 minutes before.   Take your blood pressure before (not after) you eat.   Sit comfortably with your back supported and both feet on the floor (don't cross your legs).   Elevate your arm to heart level on a table or a desk.   Use the proper sized cuff. It should fit smoothly and snugly around your bare upper arm. There should be enough room to slip a fingertip under the cuff. The bottom edge of the cuff should be 1 inch above the crease of the elbow.   Ideally, take 3 measurements at one sitting and record the average.

## 2013-02-24 ENCOUNTER — Encounter: Payer: Self-pay | Admitting: Pharmacist Clinician (PhC)/ Clinical Pharmacy Specialist

## 2013-02-24 MED ORDER — VALSARTAN 320 MG PO TABS: 320.0000 mg | ORAL_TABLET | Freq: Every day | ORAL | Status: DC

## 2013-02-24 NOTE — Progress Notes (Signed)
02/24/2013 CITLALLY CAPTAIN [birth year] [id number]   HPI:  Nicole [name] is a 73 y.o. female patient of [name], with a PMH below who presents today for a blood pressure check.   She saw Dr. Debara Pickett about 2 weeks ago and was found to be bradycardic from her metoprolol dose, but her BP was unchanged at 162/80.  At that time he decreased the metoprolol succ fromm 50mg  qd to 25mg  qd and switched her losartan to valsartan 160mg .  She has also had problems with statin drugs in the past due to non-alcoholic fatty liver disease and he asked that she consider trying Livalo due to less CYP hepatic involvement.   She returns today for follow-up.  Her BP is improved some, although still below goal.  Her HR has also improved to 60bpm.  She states compliance with her medications and we discussed the option of trying Livalo for her cholesterol.  She was in agreement, as her only drug option right now is Welchol and she doesn't like the size of the tabs or the number of pills she needs to take.  She works part time for a [employer].  She does not use tobacco products or drink alcohol.  She drinks minimal caffeine and tries to get to [facility] about 2-3 times per week, although shoulder problems limit her somewhat.  She does not add salt to her cooking, although she does eat some prepared foods.   She has an [employer] home BP cuff that is less than 32 years old and states that she had it calibrated at another office within the past year.     Current Outpatient Prescriptions  Medication Sig Dispense Refill  . CALCIUM PO Take by mouth daily.      . Cholecalciferol (VITAMIN D PO) Take by mouth once a week.      . ClonazePAM (KLONOPIN PO) Take 0.5 tablets by mouth at bedtime.      . colesevelam (WELCHOL) 625 MG tablet Take 1,250 mg by mouth 2 (two) times daily with a meal.      . esomeprazole (NEXIUM) 40 MG capsule Take 40 mg by mouth daily at 12 noon.      [name] FLUoxetine (PROZAC) 40 MG capsule Take 1 capsule (40  mg total) by mouth daily.  30 capsule  2  . ibuprofen (ADVIL,MOTRIN) 800 MG tablet Take 400 mg by mouth 2 (two) times daily as needed.      [name] levothyroxine (SYNTHROID, LEVOTHROID) 25 MCG tablet Take 25 mcg by mouth daily before breakfast.      . metoprolol succinate (TOPROL-XL) 25 MG 24 hr tablet Take 1 tablet (25 mg total) by mouth daily. Take with or immediately following a meal.  30 tablet  0  . Multiple Vitamin (MULTIVITAMIN) capsule Take 1 capsule by mouth daily.      . valsartan (DIOVAN) 160 MG tablet Take 1 tablet (160 mg total) by mouth daily.  30 tablet  6   No current facility-administered medications for this visit.    Allergies  Allergen Reactions  . Statins Other (See Comments)    Muscle aches  . Lamictal [Lamotrigine] Rash    Past Medical History  Diagnosis Date  . HTN (hypertension)   . Increased liver enzymes   . Hyperlipemia   . GERD (gastroesophageal reflux disease)   . Diabetes     Blood pressure 160/86, pulse 60, height 5\' 3"  (1.6 m), weight 130 lb 6.4 oz (59.149 kg).  Standing BP 156/76   ASSESSMENT AND PLAN:  There has not been a significant decrease in her BP since the last visit.  Her goal by JNC would be <150/90, but as she is still very active, I would like to keep it closer to <140/90.  Therefore I have asked her to increase the valsartan to 320mg  once daily, and due to the bradycardia, will decrease the Toprol to 12.5mg  for 10 days then discontinue.   We also reviewed her cholesterol and she was willing to try the Livalo at this time.  I gave her samples of the 2mg  tablets with instructions of one tablet daily, and have asked that she repeat LFT in about 2 weeks.  We will watch her LFTs closely for several months to see if there is any problems with the Livalo.  I encouraged her to continue with the [facility] and gave her some samples of that as well.  I will see her back in 1 month for follow up.   Tommy Medal PharmD CPP [facility]

## 2013-02-24 NOTE — Telephone Encounter (Signed)
Left VM for patient with instructions regarding staying on metoprolol.[name] appmt with Erasmo Downer on 3/2.[name] Having blood work done.[name] and trying OTC slow-mag.   instructed to call back with futher ?s  Labs ordered and slips mailed to patient with same instructions

## 2013-03-10 LAB — HEPATIC FUNCTION PANEL
ALK PHOS: 54 U/L (ref 39–117)
ALT: 81 U/L — ABNORMAL HIGH (ref 0–35)
AST: 78 U/L — AB (ref 0–37)
Albumin: 4.1 g/dL (ref 3.5–5.2)
BILIRUBIN INDIRECT: 0.3 mg/dL (ref 0.2–1.2)
BILIRUBIN TOTAL: 0.4 mg/dL (ref 0.2–1.2)
Bilirubin, Direct: 0.1 mg/dL (ref 0.0–0.3)
Total Protein: 6.6 g/dL (ref 6.0–8.3)

## 2013-03-17 ENCOUNTER — Telehealth: Payer: Self-pay | Admitting: *Deleted

## 2013-03-17 MED ORDER — PITAVASTATIN CALCIUM 4 MG PO TABS: 2.0000 mg | ORAL_TABLET | Freq: Every day | ORAL | Status: DC

## 2013-03-17 NOTE — Telephone Encounter (Signed)
Reviewed chart.  Pt was started on Livalo 2 mg daily at [place] w/ pharmacist on 1.30.15.    Returned call and pt informed samples left at front desk.  Pt verbalized understanding and agreed w/ plan.

## 2013-03-17 NOTE — Telephone Encounter (Signed)
Pt was calling in regards to her Livalo she wants to know if we have samples and if so can she come get some.  [place]

## 2013-03-19 ENCOUNTER — Encounter (HOSPITAL_COMMUNITY): Payer: Self-pay | Admitting: Psychiatry

## 2013-03-19 ENCOUNTER — Ambulatory Visit (INDEPENDENT_AMBULATORY_CARE_PROVIDER_SITE_OTHER): Payer: 59 | Admitting: Psychiatry

## 2013-03-19 VITALS — BP 153/74 | HR 68 | Ht 62.0 in | Wt 133.4 lb

## 2013-03-19 DIAGNOSIS — F329 Major depressive disorder, single episode, unspecified: Secondary | ICD-10-CM

## 2013-03-19 DIAGNOSIS — F1010 Alcohol abuse, uncomplicated: Secondary | ICD-10-CM

## 2013-03-19 DIAGNOSIS — F3289 Other specified depressive episodes: Secondary | ICD-10-CM

## 2013-03-19 MED ORDER — FLUOXETINE HCL 40 MG PO CAPS: 40.0000 mg | ORAL_CAPSULE | Freq: Every day | ORAL | Status: DC

## 2013-03-19 MED ORDER — LITHIUM CARBONATE 150 MG PO CAPS: 150.0000 mg | ORAL_CAPSULE | Freq: Two times a day (BID) | ORAL | Status: DC
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
## 2013-03-24 ENCOUNTER — Ambulatory Visit (INDEPENDENT_AMBULATORY_CARE_PROVIDER_SITE_OTHER): Payer: Medicare Other | Admitting: Pharmacist Clinician (PhC)/ Clinical Pharmacy Specialist

## 2013-03-24 ENCOUNTER — Encounter: Payer: Self-pay | Admitting: Pharmacist Clinician (PhC)/ Clinical Pharmacy Specialist

## 2013-03-24 VITALS — BP 164/90 | HR 60 | Ht 63.0 in | Wt 131.9 lb

## 2013-03-24 DIAGNOSIS — I10 Essential (primary) hypertension: Secondary | ICD-10-CM

## 2013-03-24 NOTE — Patient Instructions (Signed)
Return for a a follow up appointment in 6 weeks   Your blood pressure today is elevated at 164/90  Check your blood pressure at home daily (if able) and keep record of the readings.  Take your BP meds as follows: valsartan 320mg  each morning and amlodipine 5mg  each evening  Bring all of your meds, your BP cuff and your record of home blood pressures to your next appointment.  Exercise as you're able, try to walk approximately 30 minutes per day.  Keep salt intake to a minimum, especially watch canned and prepared boxed foods.  Eat more fresh fruits and vegetables and fewer canned items.  Avoid eating in fast food restaurants.    HOW TO TAKE YOUR BLOOD PRESSURE:   Rest 5 minutes before taking your blood pressure.    Don't smoke or drink caffeinated beverages for at least 30 minutes before.   Take your blood pressure before (not after) you eat.   Sit comfortably with your back supported and both feet on the floor (don't cross your legs).   Elevate your arm to heart level on a table or a desk.   Use the proper sized cuff. It should fit smoothly and snugly around your bare upper arm. There should be enough room to slip a fingertip under the cuff. The bottom edge of the cuff should be 1 inch above the crease of the elbow.   Ideally, take 3 measurements at one sitting and record the average.

## 2013-03-25 ENCOUNTER — Encounter: Payer: Self-pay | Admitting: Pharmacist Clinician (PhC)/ Clinical Pharmacy Specialist

## 2013-03-25 MED ORDER — AMLODIPINE BESYLATE 5 MG PO TABS: 5.0000 mg | ORAL_TABLET | Freq: Every day | ORAL | Status: DC

## 2013-03-25 NOTE — Progress Notes (Signed)
03/25/2013 Nicole [name] 28-Dec-1940 [id number]   HPI:  Nicole [name] is a 73 y.o. female patient of [name], with a PMH below who presents today for a blood pressure check.  I last saw Nicole [name] in January when we doubled her valsartan from 160mg  to 320mg  daily and discontinued her metoprolol due to low heartrate.   Today she has no complaints, and has brought her home BP cuff in for calibration.  Her home readings have continued to run in the 270W-237S systolic.  She does not use tobacco products or drink alcohol. She drinks minimal caffeine and [facility] [place] [facility], although shoulder problems limit her somewhat. She does not add salt to her cooking, although she does eat some prepared foods.         Current Outpatient Prescriptions  Medication Sig Dispense Refill  . ClonazePAM (KLONOPIN PO) Take 0.5 tablets by mouth at bedtime.      [name] esomeprazole (NEXIUM) 40 MG capsule Take 40 mg by mouth daily at 12 noon.      [name] FLUoxetine (PROZAC) 40 MG capsule Take 1 capsule (40 mg total) by mouth daily.  30 capsule  0  . levothyroxine (SYNTHROID, LEVOTHROID) 25 MCG tablet Take 25 mcg by mouth daily before breakfast.      . lithium carbonate 150 MG capsule Take 1 capsule (150 mg total) by mouth 2 (two) times daily with a meal.  60 capsule  0  . Milk Thistle 250 MG CAPS Take 1 capsule by mouth daily.      . Pitavastatin Calcium (LIVALO) 4 MG TABS Take 0.5 tablets (2 mg total) by mouth daily.  14 tablet  0  . valsartan (DIOVAN) 320 MG tablet Take 1 tablet (320 mg total) by mouth daily.  30 tablet  5  . amLODipine (NORVASC) 5 MG tablet Take 1 tablet (5 mg total) by mouth daily.  30 tablet  2  . CALCIUM PO Take by mouth daily.      . Cholecalciferol (VITAMIN D PO) Take by mouth once a week.      . Multiple Vitamin (MULTIVITAMIN) capsule Take 1 capsule by mouth daily.       No current facility-administered medications for this visit.    Allergies  Allergen  Reactions  . Statins Other (See Comments)    Muscle aches  . Lamictal [Lamotrigine] Rash    Past Medical History  Diagnosis Date  . HTN (hypertension)   . Increased liver enzymes   . Hyperlipemia   . GERD (gastroesophageal reflux disease)   . Diabetes     Blood pressure 164/90, pulse 60, height 5\' 3"  (1.6 m), weight 131 lb 14.4 oz (59.829 kg).   ASSESSMENT AND PLAN:  Today her BP is still quite elevated.  The patient has always been resistant to taking more medication, but I explained to her that most people with hypertension need multiple medications to control their pressure.  I considered adding HCTZ to her valsartan, thus keeping her at just a single tablet daily, but the patient mentioned that she doesn't like diuretics.  Also I don't think that adding a diuretic will give [place] a big enough drop.  Therefore I will add amlodipine 5mg  and ask her to take it in the evenings, while continuing to take the valsartan in the mornings.  I asked her to continue monitoring her home BP readings, as we tested her home meter in the office  today and found it to be accurate within 10 points.  I will see her back in 6 weeks for follow up.  Tommy Medal PharmD CPP [facility]

## 2013-03-29 ENCOUNTER — Other Ambulatory Visit: Payer: Self-pay | Admitting: Internal Medicine

## 2013-03-31 NOTE — Telephone Encounter (Signed)
Rx was sent to pharmacy electronically. 

## 2013-04-07 ENCOUNTER — Telehealth: Payer: Self-pay | Admitting: Pharmacist Clinician (PhC)/ Clinical Pharmacy Specialist

## 2013-04-07 NOTE — Telephone Encounter (Signed)
Pt called, confused because she just refilled her valsartan prescription and it was for 160mg  instead of 320mg .  Wanted to clarify which dose she should be on.   Returned call, pt should be on 320mg  daily.  She voiced understanding.  Will clarify with pharmcy

## 2013-04-15 ENCOUNTER — Telehealth: Payer: Self-pay | Admitting: *Deleted

## 2013-04-15 MED ORDER — PITAVASTATIN CALCIUM 2 MG PO TABS: 2.0000 mg | ORAL_TABLET | Freq: Every day | ORAL | Status: AC

## 2013-04-15 NOTE — Telephone Encounter (Signed)
Pt was calling in regards to samples of Livostatin. She stated that she is out and she needs more.  [place]

## 2013-04-15 NOTE — Telephone Encounter (Signed)
Returned call and left message that samples of the 2 mg tabs left at front desk.  Call back if questions.

## 2013-04-16 ENCOUNTER — Ambulatory Visit (INDEPENDENT_AMBULATORY_CARE_PROVIDER_SITE_OTHER): Payer: 59 | Admitting: Psychiatry

## 2013-04-16 ENCOUNTER — Encounter (HOSPITAL_COMMUNITY): Payer: Self-pay | Admitting: Psychiatry

## 2013-04-16 ENCOUNTER — Encounter (INDEPENDENT_AMBULATORY_CARE_PROVIDER_SITE_OTHER): Payer: Self-pay

## 2013-04-16 VITALS — BP 158/98 | HR 63 | Ht 62.0 in | Wt 135.0 lb

## 2013-04-16 DIAGNOSIS — F3289 Other specified depressive episodes: Secondary | ICD-10-CM

## 2013-04-16 DIAGNOSIS — F329 Major depressive disorder, single episode, unspecified: Secondary | ICD-10-CM

## 2013-04-16 DIAGNOSIS — F1010 Alcohol abuse, uncomplicated: Secondary | ICD-10-CM

## 2013-04-16 MED ORDER — FLUOXETINE HCL 40 MG PO CAPS: 40.0000 mg | ORAL_CAPSULE | Freq: Every day | ORAL | Status: DC
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
## 2013-05-02 ENCOUNTER — Ambulatory Visit (INDEPENDENT_AMBULATORY_CARE_PROVIDER_SITE_OTHER): Payer: 59 | Admitting: Psychology

## 2013-05-02 DIAGNOSIS — F3289 Other specified depressive episodes: Secondary | ICD-10-CM

## 2013-05-02 DIAGNOSIS — F329 Major depressive disorder, single episode, unspecified: Secondary | ICD-10-CM

## 2013-05-02 NOTE — Progress Notes (Signed)
   THERAPIST PROGRESS NOTE  Session Time: 3.32pm-3.18pm  Participation Level: Active  Behavioral Response: Well GroomedAlertDepressed  Type of Therapy: Individual Therapy  Treatment Goals addressed: Diagnosis: Depressive D/O NOS and building rapport w/ new counselor.   Interventions: CBT and Strength-based  Summary: Nicole [name] is a 73 y.o. female who presents with blunted affect and depressed mood.  Pt reports that she has been referred back to counselor by Dr. Adele Schilder and had seen Darlyn Chamber, LCSW in the past, but didn't feel any benefit from counseling and it was costing too much.  Pt reports that she has been dealing w/ depression on and off for years and feels that currently depression linked w/ external circumstances that she cannot change w/ stressors of relationship w/ her husband and concerns w/ adult children.  Pt reported that significant stress come from relationship between her husband and daughter's relationship.  Pt also reports wants to enjoy her later years and husband outlook is very negative, complains can't because of money, no intimacy and husband drinking nightly has increased.  Pt states she can only focus on changing self internally and that only comes from self.  Pt reports willing to try counseling again.    Suicidal/Homicidal: Nowithout intent/plan  Therapist Response: Assessed pt current functioning per pt report.  Explored w/pt her wants for counseling and desire for working towards self change.  Processed w/pt stressors experiencing and how they impact depression.    Plan: Return again in 4 weeks. Pt to identify goals to work on in counseling and bring to next session.   Diagnosis: Axis I: Depressive Disorder NOS    Axis II: No diagnosis    Kayton Ripp, LPC 05/02/2013

## 2013-05-05 ENCOUNTER — Ambulatory Visit (INDEPENDENT_AMBULATORY_CARE_PROVIDER_SITE_OTHER): Payer: Medicare Other | Admitting: Pharmacist Clinician (PhC)/ Clinical Pharmacy Specialist

## 2013-05-05 VITALS — BP 130/78 | HR 72 | Ht 63.0 in | Wt 131.6 lb

## 2013-05-05 DIAGNOSIS — I10 Essential (primary) hypertension: Secondary | ICD-10-CM

## 2013-05-05 NOTE — Patient Instructions (Signed)
Return for a a follow up appointment in 3 months with Dr. Debara Pickett   Your blood pressure today is good at 130/78  Check your blood pressure at home daily (if able) and keep record of the readings.  Take your BP meds as follows - valsartan 320mg  in mornings and amlodipine 5mg  in evenings   Bring all of your meds, your BP cuff and your record of home blood pressures to your next appointment.  Exercise as you're able, try to walk approximately 30 minutes per day.  Keep salt intake to a minimum, especially watch canned and prepared boxed foods.  Eat more fresh fruits and vegetables and fewer canned items.  Avoid eating in fast food restaurants.    HOW TO TAKE YOUR BLOOD PRESSURE:   Rest 5 minutes before taking your blood pressure.    Don't smoke or drink caffeinated beverages for at least 30 minutes before.   Take your blood pressure before (not after) you eat.   Sit comfortably with your back supported and both feet on the floor (don't cross your legs).   Elevate your arm to heart level on a table or a desk.   Use the proper sized cuff. It should fit smoothly and snugly around your bare upper arm. There should be enough room to slip a fingertip under the cuff. The bottom edge of the cuff should be 1 inch above the crease of the elbow.   Ideally, take 3 measurements at one sitting and record the average.

## 2013-05-06 ENCOUNTER — Encounter: Payer: Self-pay | Admitting: Pharmacist Clinician (PhC)/ Clinical Pharmacy Specialist

## 2013-05-06 NOTE — Progress Notes (Signed)
05/06/2013 Nicole [name] Jul 09, 1940 [id number]   HPI:  Nicole [name] is a 73 y.o. female patient of [name], with a PMH below who presents today for a blood pressure check.  I last saw Nicole [name] about 1 month ago when we added amlodipine 5mg  each day.  Today she has no complaints.  At her last visit she brought her home BP cuff, which was found to be accurate within 10 points.  Since the addition of amlodipine her home readings have dropped significantly, in that now they average in the 767H systolic.  Per patient there was only one reading in the 419F systolic and nothing higher.  She does not use tobacco products or drink alcohol. She drinks minimal caffeine.  She does not add salt to her cooking, although she does eat some prepared foods.  She is going to her local [employer] 3-4 times per week and getting in a good workout of stair stepper and treadmill.  Because of a torn rotator cuff she is not able to do much in the way of weights.    Current Outpatient Prescriptions  Medication Sig Dispense Refill  . amLODipine (NORVASC) 5 MG tablet Take 1 tablet (5 mg total) by mouth daily.  30 tablet  2  . CALCIUM PO Take by mouth daily.      . Cholecalciferol (VITAMIN D PO) Take by mouth once a week.      . ClonazePAM (KLONOPIN PO) Take 0.5 tablets by mouth at bedtime.      [name] esomeprazole (NEXIUM) 40 MG capsule Take 40 mg by mouth daily at 12 noon.      [name] FLUoxetine (PROZAC) 40 MG capsule Take 1 capsule (40 mg total) by mouth daily.  30 capsule  1  . levothyroxine (SYNTHROID, LEVOTHROID) 25 MCG tablet Take 25 mcg by mouth daily before breakfast.      . lithium carbonate 150 MG capsule Take 1 capsule (150 mg total) by mouth 2 (two) times daily with a meal.  60 capsule  0  . Milk Thistle 250 MG CAPS Take 1 capsule by mouth daily.      . Multiple Vitamin (MULTIVITAMIN) capsule Take 1 capsule by mouth daily.      . Pitavastatin Calcium (LIVALO) 2 MG TABS Take 1 tablet (2 mg total) by mouth  daily.  28 tablet  0  . valsartan (DIOVAN) 320 MG tablet Take 1 tablet (320 mg total) by mouth daily.  30 tablet  5   No current facility-administered medications for this visit.    Allergies  Allergen Reactions  . Statins Other (See Comments)    Muscle aches  . Lamictal [Lamotrigine] Rash    Past Medical History  Diagnosis Date  . HTN (hypertension)   . Increased liver enzymes   . Hyperlipemia   . GERD (gastroesophageal reflux disease)   . Diabetes     Blood pressure 130/78, pulse 72, height 5\' 3"  (1.6 m), weight 131 lb 9.6 oz (59.693 kg). Standing 120/70   ASSESSMENT AND PLAN:  Today her BP is within normal limits.  Her biggest complaint is still the number of medications she must take each day.  I suggested that she divide her meds so that she isn't taking 6-7 each am and only 1 each evening.  We then went through her list and made suggestions as to which she could easily move to evenings.  Overall, the combination of valsartan and amlodipine seems to be working well for  her.   I asked her to continue monitoring her home BP readings.  She is to call if they increase above 150/90 on a consistent basis.  Otherwise she is due to see Dr. Debara Pickett in 3 months.  I will see her after that if it is warranted.    Tommy Medal PharmD CPP [facility]

## 2013-05-28 ENCOUNTER — Ambulatory Visit: Payer: Medicare Other | Attending: Family Medicine

## 2013-05-28 ENCOUNTER — Ambulatory Visit (HOSPITAL_COMMUNITY): Payer: Self-pay | Admitting: Psychiatry

## 2013-05-28 DIAGNOSIS — R262 Difficulty in walking, not elsewhere classified: Secondary | ICD-10-CM | POA: Insufficient documentation

## 2013-05-28 DIAGNOSIS — R279 Unspecified lack of coordination: Secondary | ICD-10-CM | POA: Insufficient documentation

## 2013-05-28 DIAGNOSIS — IMO0001 Reserved for inherently not codable concepts without codable children: Secondary | ICD-10-CM | POA: Insufficient documentation

## 2013-05-29 ENCOUNTER — Other Ambulatory Visit: Payer: Self-pay | Admitting: Family Medicine

## 2013-05-29 ENCOUNTER — Ambulatory Visit
Admission: RE | Admit: 2013-05-29 | Discharge: 2013-05-29 | Disposition: A | Discharge status: Home / Self Care | Payer: Medicare Other | Source: Ambulatory Visit | Attending: Family Medicine | Admitting: Family Medicine

## 2013-05-29 DIAGNOSIS — S20211A Contusion of right front wall of thorax, initial encounter: Secondary | ICD-10-CM

## 2013-05-30 ENCOUNTER — Ambulatory Visit: Payer: Medicare Other | Admitting: Physical Therapy

## 2013-06-05 ENCOUNTER — Other Ambulatory Visit (HOSPITAL_COMMUNITY): Payer: Self-pay | Admitting: Psychiatry

## 2013-06-05 DIAGNOSIS — F3289 Other specified depressive episodes: Secondary | ICD-10-CM

## 2013-06-05 DIAGNOSIS — F329 Major depressive disorder, single episode, unspecified: Secondary | ICD-10-CM

## 2013-06-10 ENCOUNTER — Ambulatory Visit (HOSPITAL_COMMUNITY): Payer: Self-pay | Admitting: Psychology

## 2013-06-13 ENCOUNTER — Ambulatory Visit: Payer: Medicare Other

## 2013-06-18 ENCOUNTER — Ambulatory Visit: Payer: Medicare Other

## 2013-06-19 ENCOUNTER — Ambulatory Visit (HOSPITAL_COMMUNITY): Payer: Self-pay | Admitting: Psychiatry

## 2013-06-20 ENCOUNTER — Ambulatory Visit: Payer: Medicare Other

## 2013-06-25 ENCOUNTER — Ambulatory Visit: Payer: Medicare Other

## 2013-06-27 ENCOUNTER — Ambulatory Visit: Payer: Medicare Other

## 2013-06-30 ENCOUNTER — Ambulatory Visit: Payer: Medicare Other

## 2013-07-07 ENCOUNTER — Ambulatory Visit: Payer: Medicare Other | Attending: Family Medicine

## 2013-07-07 DIAGNOSIS — R279 Unspecified lack of coordination: Secondary | ICD-10-CM | POA: Insufficient documentation

## 2013-07-07 DIAGNOSIS — IMO0001 Reserved for inherently not codable concepts without codable children: Secondary | ICD-10-CM | POA: Insufficient documentation

## 2013-07-07 DIAGNOSIS — R262 Difficulty in walking, not elsewhere classified: Secondary | ICD-10-CM | POA: Insufficient documentation

## 2013-07-10 ENCOUNTER — Other Ambulatory Visit (HOSPITAL_COMMUNITY): Payer: Self-pay | Admitting: Psychiatry

## 2013-07-10 DIAGNOSIS — F329 Major depressive disorder, single episode, unspecified: Secondary | ICD-10-CM

## 2013-07-10 DIAGNOSIS — F3289 Other specified depressive episodes: Secondary | ICD-10-CM

## 2013-07-16 ENCOUNTER — Ambulatory Visit (HOSPITAL_COMMUNITY): Payer: Self-pay | Admitting: Psychiatry

## 2013-07-16 ENCOUNTER — Ambulatory Visit: Payer: Medicare Other

## 2013-07-31 ENCOUNTER — Ambulatory Visit (INDEPENDENT_AMBULATORY_CARE_PROVIDER_SITE_OTHER): Payer: 59 | Admitting: Psychiatry

## 2013-07-31 ENCOUNTER — Encounter (HOSPITAL_COMMUNITY): Payer: Self-pay | Admitting: Psychiatry

## 2013-07-31 VITALS — BP 141/88 | HR 75 | Ht 63.0 in | Wt 133.6 lb

## 2013-07-31 DIAGNOSIS — F3289 Other specified depressive episodes: Secondary | ICD-10-CM

## 2013-07-31 DIAGNOSIS — F1010 Alcohol abuse, uncomplicated: Secondary | ICD-10-CM

## 2013-07-31 DIAGNOSIS — F329 Major depressive disorder, single episode, unspecified: Secondary | ICD-10-CM

## 2013-07-31 MED ORDER — FLUOXETINE HCL 40 MG PO CAPS: ORAL_CAPSULE | ORAL | Status: DC

## 2013-07-31 NOTE — Progress Notes (Signed)
[facility] [phone] Progress Note  Nicole [name] [id number] 72 y.o.  07/31/2013 11:59 AM  Chief Complaint: I do not like lithium.  It did not help me at all.         History of Present Illness: Nicole [name] came for her followup appointment.  She missed her last appointment which was in the May .  She stopped taking lithium because she does not see any improvement even though she was told that it is a low dose lithium and may require higher dose.  Patient wants to continue Prozac 40 mg .  She continues to have family issues.  Her daughter's husband has been threatening to the daughter and recently her daughter took 31 B on him.  Patient also saw once therapist and either she find out that she is primary a child counselor she did not schedule any more appointments.  She has cut down her drinking but she still drinks 2-3 times a week.  Patient reported that her depression is mostly psychosocial and until these issues are not resolved there is no benefit to add anymore medications.  Patient denies any hallucination, paranoia, suicidal thoughts or homicidal thoughts.  Lately she has cut down her drinking and she is feeling more energetic and social.  She recently seen her primary care physician Nicole [name] and she was told her liver enzymes are normal.  Patient denies any side effects of medication.  She denies any recent crying spells, irritability, active or passive suicidal thoughts.  She is taking Klonopin which is given by her primary care physician Dr. Carmelina Dane.    Suicidal Ideation: No Plan Formed: No Patient has means to carry out plan: No  Homicidal Ideation: No Plan Formed: No Patient has means to carry out plan: No  Review of Systems: Psychiatric: Agitation: No Hallucination: No Depressed Mood: Yes Insomnia: Yes Hypersomnia: Yes Altered Concentration: No Feels Worthless: Yes Grandiose Ideas: No Belief In Special Powers: No New/Increased Substance Abuse:  Yes Compulsions: No Review of Systems  Musculoskeletal: Positive for back pain.  Psychiatric/Behavioral: Positive for depression.    Neurologic: Headache: No Seizure: No Paresthesias: No  Medical history Patient has history of hypertension and increased to enzyme.  She has chronic pain.  Her primary care physician is Dr. Kelton [name] .  Outpatient Encounter Prescriptions as of 07/31/2013  Medication Sig  . amLODipine (NORVASC) 5 MG tablet Take 1 tablet (5 mg total) by mouth daily.  [name] CALCIUM PO Take by mouth daily.  . Cholecalciferol (VITAMIN D PO) Take by mouth once a week.  . ClonazePAM (KLONOPIN PO) Take 0.5 tablets by mouth at bedtime.  [name] esomeprazole (NEXIUM) 40 MG capsule Take 40 mg by mouth daily at 12 noon.  [name] FLUoxetine (PROZAC) 40 MG capsule TAKE 1 CAPSULE BY MOUTH ONCE DAILY  . levothyroxine (SYNTHROID, LEVOTHROID) 25 MCG tablet Take 25 mcg by mouth daily before breakfast.  . lithium carbonate 150 MG capsule TAKE 1 CAPSULE BY MOUTH TWICE DAILY WITH A MEAL  . Milk Thistle 250 MG CAPS Take 1 capsule by mouth daily.  . Multiple Vitamin (MULTIVITAMIN) capsule Take 1 capsule by mouth daily.  . valsartan (DIOVAN) 320 MG tablet Take 1 tablet (320 mg total) by mouth daily.  . [DISCONTINUED] FLUoxetine (PROZAC) 40 MG capsule TAKE 1 CAPSULE BY MOUTH ONCE DAILY    Past Psychiatric History/Hospitalization(s): Patient denies any previous history of psychiatric inpatient treatment or suicidal attempt.  She was seeing Dr. [name] 38 years ago when  she was having issues in her marriage.  In the past she has seen therapist and taking medication Lexapro, Cymbalta, BuSpar, amitriptyline and Wellbutrin but stopped due to side effects.  Patient denies any history of psychosis mania paranoia or aggression.  We have tried trazodone for insomnia but did not feel any improvement.  We tried Lamictal but patient developed a rash.  We recommended Abilify but patient did not try after reading the  side effects and expense.  We tried lithium but after a few weeks she stopped because she does not see any improvement.  She did not want to try a higher dose of lithium.  Anxiety: Yes Bipolar Disorder: No Depression: Yes Mania: No Psychosis: No Schizophrenia: No Personality Disorder: No Hospitalization for psychiatric illness: No History of Electroconvulsive Shock Therapy: No Prior Suicide Attempts: No  Physical Exam: Constitutional:  BP 141/88  Pulse 75  Ht 5\' 3"  (1.6 m)  Wt 133 lb 9.6 oz (60.601 kg)  BMI 23.67 kg/m2  No results found for this or any previous visit (from the past 2160 hour(s)).  General Appearance: well nourished  Musculoskeletal: Strength & Muscle Tone: within normal limits Gait & Station: normal Patient leans: N/A  Mental status examination Patient is well groomed well dressed female who appears to be in her appropriate age. She appears anxious and depressed.  She maintained fair eye contact. She described her mood neutral and her affect is mood appropriate. She denies any active or passive suicidal thoughts or homicidal thoughts. She denies any auditory or visual hallucination. There were no flight of idea or loose association. Her speech is soft clear and coherent with normal tone and volume. Her attention and concentration is fair.  There were no delusions, paranoia or any obsessive thoughts.  Her fund of knowledge is adequate.  Her memory is okay.  There were no tremors or shakes present. She's alert and oriented x3. Her intermediate recall is good. Her insight judgment and pulse control is okay.   Established Problem, Stable/Improving (1), Review of Psycho-Social Stressors (1), Review of Last Therapy Session (1), Review of Medication Regimen & Side Effects (2) and Review of New Medication or Change in Dosage (2)  Assessment: Axis I: Depressive disorder NOS, alcohol abuse  Axis II: Deferred  Axis III: See medical history  Axis IV: Mild to  moderate  Axis V: 65-70   Plan: I will discontinue lithium since patient is not taking it.  She wants to continue Prozac 40 mg daily.  She is not interested in counseling.  Discussed risks and benefits of medication.  Recommended to call us back if she has any question or any concern.  Followup in 3 months.  Vaniah Chambers T., MD 07/31/2013

## 2013-08-01 ENCOUNTER — Other Ambulatory Visit: Payer: Self-pay | Admitting: Pharmacist Clinician (PhC)/ Clinical Pharmacy Specialist

## 2013-08-01 NOTE — Telephone Encounter (Signed)
Rx was sent to pharmacy electronically. 

## 2013-08-12 ENCOUNTER — Telehealth: Payer: Self-pay | Admitting: Pharmacist Clinician (PhC)/ Clinical Pharmacy Specialist

## 2013-08-12 MED ORDER — PITAVASTATIN CALCIUM 2 MG PO TABS: 2.0000 mg | ORAL_TABLET | Freq: Every day | ORAL | Status: DC

## 2013-08-12 NOTE — Telephone Encounter (Signed)
Returned call to patient, have samples of Livalo 2mg  at desk available for pickup

## 2013-08-13 ENCOUNTER — Telehealth: Payer: Self-pay | Admitting: Internal Medicine

## 2013-08-13 NOTE — Telephone Encounter (Signed)
Per answering service: Please call,concerning medication.

## 2013-08-13 NOTE — Telephone Encounter (Signed)
Livalo 2mg  samples left at front desk

## 2013-08-15 NOTE — Telephone Encounter (Signed)
Closed encounter °

## 2013-08-29 ENCOUNTER — Telehealth: Payer: Self-pay | Admitting: Pharmacist Clinician (PhC)/ Clinical Pharmacy Specialist

## 2013-08-29 NOTE — Telephone Encounter (Signed)
Pt LMOM wanted to know if any of her meds were blood thinners as she has problems bruising easily  Explained that none of her meds are blood thinners.  Pt voiced understanding

## 2013-09-24 ENCOUNTER — Telehealth (HOSPITAL_COMMUNITY): Payer: Self-pay

## 2013-09-25 NOTE — Telephone Encounter (Signed)
Patient promised to come early because she does not feel Prozac working very well.  She had appointment on 28.  Reassurance given and we will discuss other options on her appointment

## 2013-10-10 ENCOUNTER — Other Ambulatory Visit: Payer: Self-pay | Admitting: Internal Medicine

## 2013-10-10 NOTE — Telephone Encounter (Signed)
Rx was sent to pharmacy electronically. 

## 2013-10-16 ENCOUNTER — Ambulatory Visit: Payer: Self-pay | Admitting: Internal Medicine

## 2013-10-17 ENCOUNTER — Ambulatory Visit: Payer: Self-pay | Admitting: Internal Medicine

## 2013-10-20 ENCOUNTER — Ambulatory Visit (HOSPITAL_COMMUNITY): Payer: Self-pay | Admitting: Psychiatry

## 2013-10-22 ENCOUNTER — Encounter: Payer: Self-pay | Admitting: Internal Medicine

## 2013-11-04 ENCOUNTER — Ambulatory Visit (HOSPITAL_COMMUNITY): Payer: Self-pay | Admitting: Psychiatry

## 2013-11-11 ENCOUNTER — Encounter (HOSPITAL_COMMUNITY): Payer: Self-pay | Admitting: Psychology

## 2013-11-11 DIAGNOSIS — F32A Depression, unspecified: Secondary | ICD-10-CM

## 2013-11-11 DIAGNOSIS — F329 Major depressive disorder, single episode, unspecified: Secondary | ICD-10-CM

## 2013-11-11 NOTE — Progress Notes (Signed)
Nicole [name] is a 73 y.o. female patient discharged from counseling as didn't want to return for services.  Outpatient Therapist Discharge Summary  DOYNE ELLINGER    05-12-1940   Admission Date: 05/02/13   Discharge Date:  11/11/13 Reason for Discharge:  Not seeking counseling services Diagnosis:    Depressive D/O NOS   Comments:  Pt referred by Dr. Adele Schilder and attended one session w/ counselor.  Pt had seen Ishmael Holter and was transferred to this therapist.  At first session pt seemed disinterested in counseling.  Pt cancelled her f/u one month late and informed on 07/31/13 visit w/ Dr. Adele Schilder disinterested in counseling.   Delle Reining, LPC

## 2013-12-05 ENCOUNTER — Other Ambulatory Visit (HOSPITAL_COMMUNITY): Payer: Self-pay | Admitting: Psychiatry

## 2013-12-11 ENCOUNTER — Ambulatory Visit (HOSPITAL_COMMUNITY): Payer: Self-pay | Admitting: Psychiatry

## 2013-12-17 ENCOUNTER — Encounter: Payer: Self-pay | Admitting: Internal Medicine

## 2013-12-17 ENCOUNTER — Ambulatory Visit (INDEPENDENT_AMBULATORY_CARE_PROVIDER_SITE_OTHER): Payer: Medicare Other | Admitting: Internal Medicine

## 2013-12-17 VITALS — BP 138/70 | HR 65 | Ht 63.0 in | Wt 133.3 lb

## 2013-12-17 DIAGNOSIS — Z789 Other specified health status: Secondary | ICD-10-CM

## 2013-12-17 DIAGNOSIS — I10 Essential (primary) hypertension: Secondary | ICD-10-CM

## 2013-12-17 DIAGNOSIS — R748 Abnormal levels of other serum enzymes: Secondary | ICD-10-CM

## 2013-12-17 DIAGNOSIS — R5383 Other fatigue: Secondary | ICD-10-CM | POA: Insufficient documentation

## 2013-12-17 DIAGNOSIS — E785 Hyperlipidemia, unspecified: Secondary | ICD-10-CM

## 2013-12-17 DIAGNOSIS — R0789 Other chest pain: Secondary | ICD-10-CM

## 2013-12-17 DIAGNOSIS — Z889 Allergy status to unspecified drugs, medicaments and biological substances status: Secondary | ICD-10-CM

## 2013-12-17 LAB — COMPREHENSIVE METABOLIC PANEL
ALT: 128 U/L — ABNORMAL HIGH (ref 0–35)
AST: 213 U/L — AB (ref 0–37)
Albumin: 3.6 g/dL (ref 3.5–5.2)
Alkaline Phosphatase: 90 U/L (ref 39–117)
BUN: 12 mg/dL (ref 6–23)
CALCIUM: 9 mg/dL (ref 8.4–10.5)
CHLORIDE: 95 meq/L — AB (ref 96–112)
CO2: 24 mEq/L (ref 19–32)
Creat: 0.67 mg/dL (ref 0.50–1.10)
Glucose, Bld: 101 mg/dL — ABNORMAL HIGH (ref 70–99)
Potassium: 4.5 mEq/L (ref 3.5–5.3)
SODIUM: 130 meq/L — AB (ref 135–145)
TOTAL PROTEIN: 7.1 g/dL (ref 6.0–8.3)
Total Bilirubin: 0.7 mg/dL (ref 0.2–1.2)

## 2013-12-17 LAB — LIPID PANEL
CHOLESTEROL: 181 mg/dL (ref 0–200)
HDL: 40 mg/dL (ref >39)
LDL Cholesterol: 122 mg/dL — ABNORMAL HIGH (ref 0–99)
Total CHOL/HDL Ratio: 4.5 Ratio
Triglycerides: 94 mg/dL (ref <150)
VLDL: 19 mg/dL (ref 0–40)

## 2013-12-17 MED ORDER — PITAVASTATIN CALCIUM 2 MG PO TABS: 2.0000 mg | ORAL_TABLET | Freq: Every day | ORAL | Status: DC

## 2013-12-17 NOTE — Patient Instructions (Signed)
Your physician recommends that you return for lab work (fasting)  Please restart pitavastatin (livalo) 2mg  daily for cholesterol   You will need fasting lab work again in 2 months  Your physician wants you to follow-up in: 1 year with Dr. Debara Pickett. You will receive a reminder letter in the mail two months in advance. If you don't receive a letter, please call our office to schedule the follow-up appointment.

## 2013-12-17 NOTE — Progress Notes (Signed)
12/17/2013 Nicole [name] 1940/03/10 [id number]   HPI:  Nicole [name] is a 73 y.o. female patient of [name], with a PMH below who presents today for a blood pressure check.  I last saw Nicole [name] about 1 month ago when we added amlodipine 5mg  each day.  Today she has no complaints.  At her last visit she brought her home BP cuff, which was found to be accurate within 10 points.  Since the addition of amlodipine her home readings have dropped significantly, in that now they average in the 195K systolic.  Per patient there was only one reading in the 932I systolic and nothing higher.  She does not use tobacco products or drink alcohol. She drinks minimal caffeine.  She does not add salt to her cooking, although she does eat some prepared foods.  She is going to her local [employer] 3-4 times per week and getting in a good workout of stair stepper and treadmill.  Because of a torn rotator cuff she is not able to do much in the way of weights.    Nicole [name] returns today for follow-up. She reports she's been significantly fatigued for the past several months and this is unlike her. It appears that her mood is somewhat depressed. She continues to exercise but does not seem to be helping her. She reports having her thyroid checked fairly regularly and maybe her levothyroxine was increased recently to 50 g. Blood pressure has been well controlled. She currently is being treated for sinus infection on amoxicillin. She has had some lightheadedness and dizziness. She never started Livalo due to questions about the cholesterol medicine and liver function. She does have a history of mild abnormalities in her LFTs. This of course was the reason that I recommended that medication at our last office visit. She was also provided with samples which she has no idea where they are at.  Current Outpatient Prescriptions  Medication Sig Dispense Refill  . amLODipine (NORVASC) 5 MG tablet TAKE 1 TABLET EVERY DAY  30 tablet 5  . CALCIUM PO Take by mouth daily.    . ClonazePAM (KLONOPIN PO) Take 0.5-1 mg by mouth 2 (two) times daily as needed.     [name] esomeprazole (NEXIUM) 40 MG capsule Take 40 mg by mouth daily at 12 noon.    [name] FLUoxetine (PROZAC) 40 MG capsule TAKE 1 CAPSULE BY MOUTH ONCE DAILY 30 capsule 0  . levothyroxine (SYNTHROID, LEVOTHROID) 50 MCG tablet Take 1 tablet by mouth daily.  3  . Milk Thistle 250 MG CAPS Take 1 capsule by mouth daily.    . Multiple Vitamin (MULTIVITAMIN) capsule Take 1 capsule by mouth daily.    . Pitavastatin Calcium 2 MG TABS Take 1 tablet (2 mg total) by mouth daily. 30 tablet 6  . traMADol (ULTRAM) 50 MG tablet Take 50-100 mg by mouth every 6 (six) hours as needed.    . valsartan (DIOVAN) 320 MG tablet TAKE 1 TABLET (320 MG TOTAL) BY MOUTH DAILY. 30 tablet 4   No current facility-administered medications for this visit.    Allergies  Allergen Reactions  . Statins Other (See Comments)    Muscle aches  . Lamictal [Lamotrigine] Rash    Past Medical History  Diagnosis Date  . HTN (hypertension)   . Increased liver enzymes   . Hyperlipemia   . GERD (gastroesophageal reflux disease)   . Diabetes     Blood pressure 138/70, pulse 65, height 5\' 3"  (  1.6 m), weight 133 lb 4.8 oz (60.464 kg).   EXAM: GEN: Awake, NAD HEENT: PERRLA, EOMI Lungs: Clear Cardiovascular: Regular rate and rhythm Abdomen: Soft, nontender Extremities: No edema Neurologic: Grossly nonfocal Psych: Flat mood, affect, unusual and tangential responses to questions.  EKG: Sinus rhythm with short PR interval at 65, incomplete right bundle branch block  PLAN:   1.  Blood pressure. Better controlled. She is reporting some fatigue which is unlikely to be due to coronary disease. I did offer to obtain a exercise treadmill stress test but she declined. We again discussed Livalo as an option to treat her cholesterol. She is concerned about her liver enzyme abnormalities in the past. She's been  intolerant to all other statins. I would recommend a baseline CMP and lipid profile. Then I would recommend starting Livalo 2 mg daily. Repeat blood work in 2 months. Follow-up with me annually or sooner as necessary.  Pixie Casino, MD, [facility] Attending Cardiologist [place]

## 2013-12-22 ENCOUNTER — Telehealth: Payer: Self-pay | Admitting: Internal Medicine

## 2013-12-22 NOTE — Telephone Encounter (Signed)
Returned call to patient no answer.LMTC. 

## 2013-12-22 NOTE — Telephone Encounter (Signed)
Pt wants to know if she had a vitamin D ordered in her last lab work?

## 2013-12-22 NOTE — Telephone Encounter (Signed)
I called pt. And told her no, a vitamin d level was not ordered

## 2013-12-25 ENCOUNTER — Telehealth (HOSPITAL_COMMUNITY): Payer: Self-pay | Admitting: *Deleted

## 2013-12-25 NOTE — Telephone Encounter (Signed)
Patient left VM: She ran out of Prozac on Tuesday. She has appt on Friday - Dr. Adele Schilder may change her medicine on Friday and she wondered if it was okay to go without Prozac until then.  Gave Dr. Adele Schilder information - He asked that pt be called and told she can go without Prozac and they will discuss medication on Friday  Phoned patient - gave information as per Dr. Adele Schilder

## 2013-12-26 ENCOUNTER — Ambulatory Visit (HOSPITAL_COMMUNITY): Payer: Self-pay | Admitting: Psychiatry

## 2014-01-02 ENCOUNTER — Encounter (HOSPITAL_COMMUNITY): Payer: Self-pay | Admitting: Psychiatry

## 2014-01-02 ENCOUNTER — Ambulatory Visit (INDEPENDENT_AMBULATORY_CARE_PROVIDER_SITE_OTHER): Payer: 59 | Admitting: Psychiatry

## 2014-01-02 DIAGNOSIS — F329 Major depressive disorder, single episode, unspecified: Secondary | ICD-10-CM

## 2014-01-02 DIAGNOSIS — F32A Depression, unspecified: Secondary | ICD-10-CM

## 2014-01-02 DIAGNOSIS — F1010 Alcohol abuse, uncomplicated: Secondary | ICD-10-CM

## 2014-01-02 NOTE — Progress Notes (Signed)
[place] [phone] Progress Note  Nicole [name] [id number] 73 y.o.  01/02/2014 12:03 PM  Chief Complaint: I stop taking Prozac.  I don't think it is working.         History of Present Illness: Nicole [name] came for her followup appointment.  She was last seen in July.  She is no longer taking Prozac.  She felt Prozac did not help her.  She stopped taking medication more than a month ago.  She admitted anxiety and depression but also believe her psychosocial issues contributing to her illness.  She endorsed marital conflict, her husband is alcoholic and he has a lot of health issues.  Today she mentioned that her husband also see a therapist in this office however not agreed to have a marriage counseling.  Patient admitted increased sleep, lack of energy, generalized weakness and lack of motivation to do anything.  She feels sadness and discouragement.  She was relieved that her family came to visit on Thanksgiving as she does not have to travel.  Recently she visited her primary care physician and a new medication is added for her high cholesterol.  Her liver enzymes are also very high.  She admitted drinking on and off but does not believe it is a issue.  She is taking Klonopin which is given by her primary care physician Kelton Pillar.  She scheduled to see primary care physician next week.  Patient denies any agitation, anger, mood swing.  She has lot of family issues .  In the past she has been very reluctant to see a therapist because she does not believe in therapy .  I had a long discussion given the fact that she has tried multiple antidepressant in the past with limited response and now she has a high liver enzymes she should consider seriously therapy as an option.  We also talk about stopping alcohol.  Patient denies any paranoia, hallucination or any aggression or violence.  She denies any active or passive suicidal thoughts or homicidal thought.    Suicidal Ideation: No  Plan Formed: No Patient has means to carry out plan: No  Homicidal Ideation: No Plan Formed: No Patient has means to carry out plan: No  Review of Systems: Psychiatric: Agitation: No Hallucination: No Depressed Mood: Yes Insomnia: Yes Hypersomnia: Yes Altered Concentration: No Feels Worthless: Yes Grandiose Ideas: No Belief In Special Powers: No New/Increased Substance Abuse: Yes Compulsions: No Review of Systems  Constitutional: Positive for malaise/fatigue.  Musculoskeletal: Positive for back pain.  Psychiatric/Behavioral: Positive for depression.    Neurologic: Headache: No Seizure: No Paresthesias: No  Medical history Patient has history of hypertension and increased to enzyme.  She has chronic pain.  Her primary care physician is [name] .  Outpatient Encounter Prescriptions as of 01/02/2014  Medication Sig  . amLODipine (NORVASC) 5 MG tablet TAKE 1 TABLET EVERY DAY  . CALCIUM PO Take by mouth daily.  . ClonazePAM (KLONOPIN PO) Take 0.5-1 mg by mouth 2 (two) times daily as needed.   [name] esomeprazole (NEXIUM) 40 MG capsule Take 40 mg by mouth daily at 12 noon.  [name] levothyroxine (SYNTHROID, LEVOTHROID) 50 MCG tablet Take 1 tablet by mouth daily.  . Milk Thistle 250 MG CAPS Take 1 capsule by mouth daily.  . Multiple Vitamin (MULTIVITAMIN) capsule Take 1 capsule by mouth daily.  . Pitavastatin Calcium 2 MG TABS Take 1 tablet (2 mg total) by mouth daily.  . traMADol (ULTRAM) 50 MG tablet Take 50-100  mg by mouth every 6 (six) hours as needed.  . valsartan (DIOVAN) 320 MG tablet TAKE 1 TABLET (320 MG TOTAL) BY MOUTH DAILY.  . [DISCONTINUED] FLUoxetine (PROZAC) 40 MG capsule TAKE 1 CAPSULE BY MOUTH ONCE DAILY (Patient not taking: Reported on 01/02/2014)    Past Psychiatric History/Hospitalization(s): Patient denies any previous history of psychiatric inpatient treatment or suicidal attempt.  She was seeing Dr. [name] 38 years ago when she was having issues in  her marriage.  In the past she has seen therapist and taking medication Lexapro, Cymbalta, BuSpar, amitriptyline and Wellbutrin but stopped due to side effects.  Patient denies any history of psychosis mania paranoia or aggression.  We have tried trazodone , lithium and recently Prozac but patient does not see any improvement.  She liked Lamictal but she developed a rash .  She was offered Abilify but she could not afford.   Anxiety: Yes Bipolar Disorder: No Depression: Yes Mania: No Psychosis: No Schizophrenia: No Personality Disorder: No Hospitalization for psychiatric illness: No History of Electroconvulsive Shock Therapy: No Prior Suicide Attempts: No  Physical Exam: Constitutional:  There were no vitals taken for this visit.  Recent Results (from the past 2160 hour(s))  Lipid panel     Status: Abnormal   Collection Time: 12/17/13 11:46 AM  Result Value Ref Range   Cholesterol 181 0 - 200 mg/dL    Comment: ATP III Classification:       < 200        mg/dL        Desirable      200 - 239     mg/dL        Borderline High      >= 240        mg/dL        High      Triglycerides 94 <150 mg/dL   HDL 40 >39 mg/dL   Total CHOL/HDL Ratio 4.5 Ratio   VLDL 19 0 - 40 mg/dL   LDL Cholesterol 122 (H) 0 - 99 mg/dL    Comment:   Total Cholesterol/HDL Ratio:CHD Risk                        Coronary Heart Disease Risk Table                                        Men       Women          1/2 Average Risk              3.4        3.3              Average Risk              5.0        4.4           2X Average Risk              9.6        7.1           3X Average Risk             23.4       11.0 Use the calculated Patient Ratio above and the CHD Risk table  to determine the patient's CHD Risk. ATP III Classification (LDL):       <  100        mg/dL         Optimal      100 - 129     mg/dL         Near or Above Optimal      130 - 159     mg/dL         Borderline High      160 - 189     mg/dL          High       > 190        mg/dL         Very High     Comp Met (CMET)     Status: Abnormal   Collection Time: 12/17/13 11:46 AM  Result Value Ref Range   Sodium 130 (L) 135 - 145 mEq/L   Potassium 4.5 3.5 - 5.3 mEq/L   Chloride 95 (L) 96 - 112 mEq/L   CO2 24 19 - 32 mEq/L   Glucose, Bld 101 (H) 70 - 99 mg/dL   BUN 12 6 - 23 mg/dL   Creat 0.67 0.50 - 1.10 mg/dL   Total Bilirubin 0.7 0.2 - 1.2 mg/dL   Alkaline Phosphatase 90 39 - 117 U/L   AST 213 (H) 0 - 37 U/L   ALT 128 (H) 0 - 35 U/L   Total Protein 7.1 6.0 - 8.3 g/dL   Albumin 3.6 3.5 - 5.2 g/dL   Calcium 9.0 8.4 - 10.5 mg/dL    General Appearance: well nourished  Musculoskeletal: Strength & Muscle Tone: within normal limits Gait & Station: normal Patient leans: N/A  Mental status examination Patient is well groomed well dressed female who appears to be in her appropriate age. She appears anxious and depressed.  She maintained fair eye contact. She described her mood neutral and her affect is mood appropriate. She denies any active or passive suicidal thoughts or homicidal thoughts. She denies any auditory or visual hallucination. There were no flight of idea or loose association. Her speech is soft clear and coherent with normal tone and volume. Her attention and concentration is fair.  There were no delusions, paranoia or any obsessive thoughts.  Her fund of knowledge is adequate.  Her memory is okay.  There were no tremors or shakes present. She's alert and oriented x3. Her intermediate recall is good. Her insight judgment and pulse control is okay.   Established Problem, Stable/Improving (1), Review of Psycho-Social Stressors (1), Review or order clinical lab tests (1), Established Problem, Worsening (2), Review of Last Therapy Session (1), Review of Medication Regimen & Side Effects (2) and Review of New Medication or Change in Dosage (2)  Assessment: Axis I: Depressive disorder NOS, alcohol abuse  Axis II:  Deferred  Axis III: See medical history  Axis IV: Mild to moderate  Axis V: 65-70   Plan: I have a long discussion with the patient about her blood work results and medication side effects and benefits.  She does not want to try any medication at this time however after some encouragement he agreed to give a try for counseling.  Her liver enzymes are significant high.  I strongly encouraged to keep appointment with primary care physician as she may need further workup.  Strongly encouraged to stop drinking and encouraged to verbalize her psychosocial issues with the therapist.  Patient wants to defer any medication until she see primary care physician.  She has lot of somatic symptoms  which could be due to underlying medical cause.  I will discontinue Prozac since she is no longer taking it.  Recommended to call us back if she has any question, concern or if she feels worsening of the symptoms.  I will see her again in 6 weeks. Time spent 25 minutes.  More than 50% of the time spent in psychoeducation, counseling and coordination of care.  Discuss safety plan that anytime having active suicidal thoughts or homicidal thoughts then patient need to call 911 or go to the local emergency room.   Peyton Spengler T., MD 01/02/2014

## 2014-01-05 ENCOUNTER — Other Ambulatory Visit: Payer: Self-pay

## 2014-01-05 DIAGNOSIS — Z1231 Encounter for screening mammogram for malignant neoplasm of breast: Secondary | ICD-10-CM

## 2014-01-07 ENCOUNTER — Telehealth: Payer: Self-pay | Admitting: Internal Medicine

## 2014-01-07 NOTE — Telephone Encounter (Signed)
Called left message for patient to call back. Continue to hold livalo

## 2014-01-07 NOTE — Telephone Encounter (Signed)
The liver enzymes are elevated - more than 3x the upper limit of normal (which is accepted). It does not appear that she will tolerate statin therapy. I would recommend discontinuing her statin. She should have a re-check of her liver enzymes in 2 weeks.  Dr. Lemmie Evens

## 2014-01-07 NOTE — Telephone Encounter (Signed)
Spoke to patient.  patient states she received information form one of her doctors that her liver enzymes were elevated in Nov 2015 Labs (lipid, cmp) were drawn by [name]. She states she was not aware of her liver enzymes results (AST 213:ALT 128) , she is concerned since she is taking Livalo 2 mg daily.  RN informed patient to hold today's dose until further instructions Will defer to [name]

## 2014-01-07 NOTE — Telephone Encounter (Signed)
Please call,concerning her Livalo. Pt have some concerns about the side effects.

## 2014-01-08 NOTE — Telephone Encounter (Signed)
Returning your call. °

## 2014-01-08 NOTE — Telephone Encounter (Signed)
She stated that Ivin Booty does not have to call her back, nothing has changed.Marland KitchenMarland Kitchen

## 2014-01-08 NOTE — Telephone Encounter (Signed)
Left message to call back  

## 2014-01-08 NOTE — Telephone Encounter (Signed)
Pt is returning Sharon's call °

## 2014-01-08 NOTE — Telephone Encounter (Signed)
Left message to call back RN need to discuss with patient.

## 2014-01-08 NOTE — Telephone Encounter (Signed)
Spoke with patient. She is understanding to hold livalo and recheck liver enzymes in 2 weeks. She will have labs with PCP on 12/30. She will request a CMET or hepatic function panel to be ordered and fax number given to patient - she wrote this down. Labs routed to PCP as well

## 2014-01-14 ENCOUNTER — Ambulatory Visit (HOSPITAL_COMMUNITY): Payer: Self-pay | Admitting: Clinical

## 2014-01-22 ENCOUNTER — Other Ambulatory Visit: Payer: Self-pay

## 2014-01-22 ENCOUNTER — Ambulatory Visit
Admission: RE | Admit: 2014-01-22 | Discharge: 2014-01-22 | Disposition: A | Discharge status: Home / Self Care | Payer: Medicare Other | Source: Ambulatory Visit

## 2014-01-22 DIAGNOSIS — Z1231 Encounter for screening mammogram for malignant neoplasm of breast: Secondary | ICD-10-CM

## 2014-02-06 ENCOUNTER — Ambulatory Visit (HOSPITAL_COMMUNITY): Payer: Self-pay | Admitting: Clinical

## 2014-02-11 ENCOUNTER — Ambulatory Visit (HOSPITAL_COMMUNITY): Payer: Self-pay | Admitting: Clinical

## 2014-02-13 ENCOUNTER — Ambulatory Visit (HOSPITAL_COMMUNITY): Payer: Self-pay | Admitting: Psychiatry

## 2014-02-18 ENCOUNTER — Other Ambulatory Visit: Payer: Self-pay | Admitting: Internal Medicine

## 2014-02-18 NOTE — Telephone Encounter (Signed)
Rx(s) sent to pharmacy electronically.  

## 2014-02-23 ENCOUNTER — Ambulatory Visit (HOSPITAL_COMMUNITY): Payer: Self-pay | Admitting: Clinical

## 2014-02-26 ENCOUNTER — Ambulatory Visit (INDEPENDENT_AMBULATORY_CARE_PROVIDER_SITE_OTHER): Payer: Medicare Other | Admitting: Clinical

## 2014-02-26 ENCOUNTER — Encounter (HOSPITAL_COMMUNITY): Payer: Self-pay | Admitting: Clinical

## 2014-02-26 DIAGNOSIS — F341 Dysthymic disorder: Secondary | ICD-10-CM | POA: Diagnosis not present
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
## 2014-03-13 ENCOUNTER — Ambulatory Visit (HOSPITAL_COMMUNITY): Payer: Self-pay | Admitting: Clinical

## 2014-03-17 ENCOUNTER — Ambulatory Visit (HOSPITAL_COMMUNITY): Payer: Self-pay | Admitting: Psychiatry

## 2014-03-29 ENCOUNTER — Other Ambulatory Visit: Payer: Self-pay | Admitting: Internal Medicine

## 2014-04-07 ENCOUNTER — Other Ambulatory Visit: Payer: Self-pay | Admitting: Internal Medicine

## 2014-04-07 NOTE — Telephone Encounter (Signed)
Valsartan refilled #30 with 6 refills 03/30/14

## 2014-05-28 ENCOUNTER — Ambulatory Visit
Admission: RE | Admit: 2014-05-28 | Discharge: 2014-05-28 | Disposition: A | Discharge status: Home / Self Care | Payer: Medicare Other | Source: Ambulatory Visit | Attending: Physician Assistant | Admitting: Physician Assistant

## 2014-05-28 ENCOUNTER — Other Ambulatory Visit: Payer: Self-pay | Admitting: Physician Assistant

## 2014-05-28 DIAGNOSIS — T1490XA Injury, unspecified, initial encounter: Secondary | ICD-10-CM

## 2014-07-15 ENCOUNTER — Ambulatory Visit (INDEPENDENT_AMBULATORY_CARE_PROVIDER_SITE_OTHER): Payer: Medicare Other | Admitting: Psychiatry

## 2014-07-15 ENCOUNTER — Encounter (HOSPITAL_COMMUNITY): Payer: Self-pay | Admitting: Psychiatry

## 2014-07-15 VITALS — BP 130/72 | HR 84 | Ht 63.0 in | Wt 131.0 lb

## 2014-07-15 DIAGNOSIS — F1010 Alcohol abuse, uncomplicated: Secondary | ICD-10-CM | POA: Diagnosis not present

## 2014-07-15 DIAGNOSIS — F329 Major depressive disorder, single episode, unspecified: Secondary | ICD-10-CM | POA: Diagnosis not present

## 2014-07-15 DIAGNOSIS — F32A Depression, unspecified: Secondary | ICD-10-CM

## 2014-07-15 DIAGNOSIS — Z79899 Other long term (current) drug therapy: Secondary | ICD-10-CM

## 2014-07-15 MED ORDER — MIRTAZAPINE 15 MG PO TABS: 15.0000 mg | ORAL_TABLET | Freq: Every day | ORAL | Status: DC

## 2014-07-15 NOTE — Progress Notes (Signed)
[facility] [phone] Progress Note  Nicole [name] [id number] 73 y.o.  07/15/2014 2:20 PM  Chief Complaint: My depression is getting worse.  I think I need to go back on medication.           History of Present Illness: Nicole [name] came for her followup appointment.  She was last seen in December 2015.  She had decided to stop Prozac at that time.  She is experiencing increased anxiety, depression, crying spells and fatigue.  Though she has cut down her drinking but she felt her depression got worse.  Her major stressor is her husband.  She believe that her husband does not care about her .  Patient told he has no intimacy, emotions towards her.  She admitted lately complaining of racing thoughts, poor sleep, crying spells, irritability and socially isolated.  Though she goes to a gym but she does not enjoy at all.  She wished that her husband has given more time in the relationship.  Her husband also see psychiatrist in this office but patient is concerned that he is not telling the truth to the psychiatrist.  Patient told her husband continues to drink every day and she does not know what to do.  We have recommended to see [name] is an patient seen once in past 6 months.  However she agreed that she need to see [name] more often.  She admitted financial strains because her husband does not believe in therapy.  Patient admitted sometime feeling hopeless helpless  And worthlessness but denies any active or passive suicidal thoughts or homicidal thought.  In the past she had tried multiple) depressed and however patient has limited response and she developed a rash with Lamictal.  She has not seen her primary care physician in past 6.  Patient denies any paranoia, hallucination, aggression or any self abusive behavior.  Patient told that she's been married for 40 years and sometime she regret in her relationship.  She lives with her husband.  She goes to church but again she does not enjoy  as much.  She is taking Klonopin half to one tablet as needed at bedtime but is given by her primary care physician however she does not feel it is working very well.  Her appetite is okay.  Her vitals are stable.   She denies any illegal substance use.  She has no tremors, shakes or any EPS.    Suicidal Ideation: No Plan Formed: No Patient has means to carry out plan: No  Homicidal Ideation: No Plan Formed: No Patient has means to carry out plan: No  Review of Systems: Psychiatric: Agitation: No Hallucination: No Depressed Mood: Yes Insomnia: Yes Hypersomnia: Yes Altered Concentration: No Feels Worthless: Yes Grandiose Ideas: No Belief In Special Powers: No New/Increased Substance Abuse: No Compulsions: No Review of Systems  Constitutional: Positive for malaise/fatigue.  Eyes: Negative for blurred vision.  Cardiovascular: Negative for chest pain and palpitations.  Musculoskeletal: Positive for back pain.  Skin: Negative for itching and rash.  Neurological: Negative for dizziness, tingling, tremors and headaches.  Psychiatric/Behavioral: Positive for depression. The patient is nervous/anxious and has insomnia.     Neurologic: Headache: No Seizure: No Paresthesias: No  Medical history Patient has history of hypertension and increased to enzyme.  She has chronic pain.  Her primary care physician is [name] .  Outpatient Encounter Prescriptions as of 07/15/2014  Medication Sig  . amLODipine (NORVASC) 5 MG tablet Take 1 tablet (5 mg  total) by mouth daily.  [name] CALCIUM PO Take by mouth daily.  . ClonazePAM (KLONOPIN PO) Take 0.5-1 mg by mouth 2 (two) times daily as needed.   [name] levothyroxine (SYNTHROID, LEVOTHROID) 50 MCG tablet Take 1 tablet by mouth daily.  . Milk Thistle 250 MG CAPS Take 1 capsule by mouth daily.  . mirtazapine (REMERON) 15 MG tablet Take 1 tablet (15 mg total) by mouth at bedtime.  . Multiple Vitamin (MULTIVITAMIN) capsule Take 1 capsule by mouth  daily.  . Pitavastatin Calcium 2 MG TABS Take 1 tablet (2 mg total) by mouth daily. (Patient not taking: Reported on 02/26/2014)  . valsartan (DIOVAN) 320 MG tablet TAKE 1 TABLET BY MOUTH EVERY DAY  . [DISCONTINUED] esomeprazole (NEXIUM) 40 MG capsule Take 40 mg by mouth daily at 12 noon.  . [DISCONTINUED] traMADol (ULTRAM) 50 MG tablet Take 50-100 mg by mouth every 6 (six) hours as needed.   No facility-administered encounter medications on file as of 07/15/2014.    Past Psychiatric History/Hospitalization(s): Patient denies any previous history of psychiatric inpatient treatment or suicidal attempt.  She was seeing Dr. [name] 38 years ago when she was having issues in her marriage.  In the past she has seen therapist and taking medication Lexapro, Cymbalta, BuSpar, amitriptyline and Wellbutrin but stopped due to side effects.  Patient denies any history of psychosis mania paranoia or aggression.  We have tried trazodone , lithium and recently Prozac but patient does not see any improvement.  She liked Lamictal but she developed a rash .  She was offered Abilify but she could not afford.   we tried lithium but patient did not like the effect.   Anxiety: Yes Bipolar Disorder: No Depression: Yes Mania: No Psychosis: No Schizophrenia: No Personality Disorder: No Hospitalization for psychiatric illness: No History of Electroconvulsive Shock Therapy: No Prior Suicide Attempts: No  Physical Exam: Constitutional:  BP 130/72 mmHg  Pulse 84  Ht 5\' 3"  (1.6 m)  Wt 131 lb (59.421 kg)  BMI 23.21 kg/m2  No results found for this or any previous visit (from the past 2160 hour(s)).  General Appearance: well nourished  Musculoskeletal: Strength & Muscle Tone: within normal limits Gait & Station: normal Patient leans: N/A  Mental status examination Patient is well groomed well dressed female who appears to be in her appropriate age. She appears anxious,  tearful and depressed.  She  maintained fair eye contact. She described her mood depressed and her affect is constricted.  She denies any active or passive suicidal thoughts or homicidal thoughts. She denies any auditory or visual hallucination. There were no flight of idea or loose association. Her speech is soft clear and coherent with normal tone and volume. Her attention and concentration is fair.  There were no delusions, paranoia or any obsessive thoughts.  Her fund of knowledge is adequate.  Her memory is okay.  There were no tremors or shakes present. She's alert and oriented x3. Her intermediate recall is good. Her insight judgment and pulse control is okay.   Established Problem, Stable/Improving (1), Review of Psycho-Social Stressors (1), Review or order clinical lab tests (1), Established Problem, Worsening (2), Review of Last Therapy Session (1), Review of Medication Regimen & Side Effects (2) and Review of New Medication or Change in Dosage (2)  Assessment: Axis I: Depressive disorder NOS, alcohol abuse  Axis II: Deferred  Axis III: See medical history  Plan:  I review her records, psychosocial stressors, current medication and collateral information.  Patient is slowly decompensating from the past.  I had a long discussion with the patient about her stressors .  Her husband does not believe in marital counseling .  I strongly encouraged to restart therapy with [name] for coping and social skills.  We will try Remeron 15 mg at bedtime to help insomnia, depression and anxiety symptoms.  Discussed medication side effects and benefits special he metabolic and weight gain side effects.  I also encouraged to have her blood work done since patient has history of high liver enzymes.  Patient has not been drinking in past 2 months.  Discuss safety concern and recommended to call us back or go to the local emergency room if she has any active suicidal thoughts or homicidal thought.  Encouraged to involved in volunteer program  to keep herself busy.  Discuss sleep hygiene and weight maintenance.  I will see her again in 4 weeks.  Time spent 25 minutes.  More than 50% of the time spent in psychotic education, counseling" of care.  Patient was also given enough time to discuss about her medication, diagnoses and prognosis.    Gianni Fuchs T., MD 07/15/2014

## 2014-07-16 ENCOUNTER — Ambulatory Visit (INDEPENDENT_AMBULATORY_CARE_PROVIDER_SITE_OTHER): Payer: Medicare Other | Admitting: Clinical

## 2014-07-16 ENCOUNTER — Encounter (HOSPITAL_COMMUNITY): Payer: Self-pay | Admitting: Clinical

## 2014-07-16 DIAGNOSIS — F341 Dysthymic disorder: Secondary | ICD-10-CM

## 2014-07-16 LAB — CBC WITH DIFFERENTIAL/PLATELET
Basophils Absolute: 0.1 10*3/uL (ref 0.0–0.1)
Basophils Relative: 1 % (ref 0–1)
Eosinophils Absolute: 0.2 10*3/uL (ref 0.0–0.7)
Eosinophils Relative: 3 % (ref 0–5)
HEMATOCRIT: 45.9 % (ref 36.0–46.0)
HEMOGLOBIN: 15.8 g/dL — AB (ref 12.0–15.0)
LYMPHS ABS: 2.8 10*3/uL (ref 0.7–4.0)
Lymphocytes Relative: 35 % (ref 12–46)
MCH: 32.8 pg (ref 26.0–34.0)
MCHC: 34.4 g/dL (ref 30.0–36.0)
MCV: 95.2 fL (ref 78.0–100.0)
MONO ABS: 0.9 10*3/uL (ref 0.1–1.0)
MONOS PCT: 11 % (ref 3–12)
MPV: 10 fL (ref 8.6–12.4)
Neutro Abs: 4 10*3/uL (ref 1.7–7.7)
Neutrophils Relative %: 50 % (ref 43–77)
Platelets: 187 10*3/uL (ref 150–400)
RBC: 4.82 MIL/uL (ref 3.87–5.11)
RDW: 13.4 % (ref 11.5–15.5)
WBC: 7.9 10*3/uL (ref 4.0–10.5)

## 2014-07-16 LAB — COMPLETE METABOLIC PANEL WITH GFR
ALT: 78 U/L — ABNORMAL HIGH (ref 0–35)
AST: 81 U/L — ABNORMAL HIGH (ref 0–37)
Albumin: 4.1 g/dL (ref 3.5–5.2)
Alkaline Phosphatase: 90 U/L (ref 39–117)
BUN: 10 mg/dL (ref 6–23)
CO2: 26 meq/L (ref 19–32)
Calcium: 10.3 mg/dL (ref 8.4–10.5)
Chloride: 97 mEq/L (ref 96–112)
Creat: 0.74 mg/dL (ref 0.50–1.10)
GFR, EST NON AFRICAN AMERICAN: 81 mL/min
GLUCOSE: 102 mg/dL — AB (ref 70–99)
Potassium: 4.2 mEq/L (ref 3.5–5.3)
SODIUM: 135 meq/L (ref 135–145)
TOTAL PROTEIN: 7.8 g/dL (ref 6.0–8.3)
Total Bilirubin: 0.7 mg/dL (ref 0.2–1.2)

## 2014-07-16 LAB — LIPID PANEL
CHOL/HDL RATIO: 5 ratio
Cholesterol: 215 mg/dL — ABNORMAL HIGH (ref 0–200)
HDL: 43 mg/dL — AB (ref >=46)
LDL CALC: 139 mg/dL — AB (ref 0–99)
Triglycerides: 167 mg/dL — ABNORMAL HIGH (ref <150)
VLDL: 33 mg/dL (ref 0–40)

## 2014-07-16 LAB — TSH: TSH: 3.5 u[IU]/mL (ref 0.350–4.500)

## 2014-07-16 NOTE — Progress Notes (Signed)
THERAPIST PROGRESS NOTE  Session Time: 11:03-11:59  Participation Level: Active  Behavioral Response: CasualAlertDepressed  Type of Therapy: Individual Therapy  Treatment Goals addressed: improve psychiatric symptoms  Interventions: Motivational Interviewing  Summary: Nicole [name] is [name] 74 y.o. female who presents with Dystymia.   Suicidal/Homicidal: Nowithout intent/plan  Therapist Response: Mardene Celeste met with clinician for an individual session. Nicole [name] shared about her current life events and her psychiatric symptoms. Nicole [name] shared that she continues to be depressed. Nicole [name] had not attended therapy since 02/26/14. When asked if she planned to attend therapy Nicole [name] avoided the question. Nicole [name] shared that she continues to be unhappy in her marriage stating that her husband is unaffectionate and drinks all the time. Nicole [name] shared that she had been trying to make herself happy but it is not working. She shared that she really wants attention from him but he is not willing. Nicole [name] shared about her daughter who suffers from bipolar and some of the troubles she has. Clinician asked if Nicole [name] would like to work on her depression or what she thought might be the solution to her depression and Nicole [name] changed the subject back to her husband's or her daughter's problems.   Plan: No plan for return was made.  Diagnosis: Axis I: Dysthymia      Nicole Cayer A, LCSW 07/16/2014

## 2014-07-17 LAB — HEMOGLOBIN A1C
HEMOGLOBIN A1C: 5.9 % — AB (ref <5.7)
MEAN PLASMA GLUCOSE: 123 mg/dL — AB (ref <117)

## 2014-07-29 ENCOUNTER — Telehealth (HOSPITAL_COMMUNITY): Payer: Self-pay

## 2014-07-29 NOTE — Telephone Encounter (Signed)
Met with Dr. Lovena Le to discuss patient's currently complaints and recent start of Remeron 43m one at bedtime.  Called patient back to inform of Dr. TTanna Furryinstructions to cut her Remeron to 7.533mat bedtime to see if this would help with symptoms of Depression while also alleviating problems with dreams, headaches and increased voiding but improving sleep.  Patient agreed with plan and agreed to call back in a week to let usKoreanow how this is working.  Patient also agreed if any symptoms worsened, if she had any suicidal or homicidal ideations to follow up with and emergency evaluation or to contact usKoreaack.  Patient in agreement with plan and will call back in one week or as needed.

## 2014-07-29 NOTE — Telephone Encounter (Signed)
Telephone call with patient after she left a message stating she was getting headaches, problems with increased voiding and bad dreams since starting Remeron.  States she wakes up and just feels "like I have run a race".  States she has tried a lot of different antidepressants in the past but none really effective to this point.  States she would rather go without medication that makes her feel worse or gives her other problems and would like advice of what to do since she does not feel Remeron is helping and causing side effects.  Informed patient Dr. Adele Schilder is currently out on vacation and would send to covering physicians to see if there were any recommendations and patient agreed with plan. Patient started on Remeron at evaluation on 07/15/14 and does not return to see Dr. Adele Schilder until 08/26/14.

## 2014-08-04 ENCOUNTER — Telehealth (HOSPITAL_COMMUNITY): Payer: Self-pay

## 2014-08-04 NOTE — Telephone Encounter (Signed)
Medication problem - Patient states lowering Remeron dosage stil not helpful which she tried from instructions 07/29/14.  Says slept when she took a Klonopin she had from Dr. Laurann Montana.  Concerned about liver functins going back up.  Denies drinking and wants changes that will not harm her liver as recently had lab work.  Patient reports she is sensitive to antidepressant medication and would like to discuss any appropriate changes.

## 2014-08-04 NOTE — Telephone Encounter (Signed)
Met with Dr. Salem Senate to discuss patient's continued problems with sleep and Dr. Salem Senate requested patient try Benedryl OTC 74m, one at bedtime as needed to see if would help.  Called patient to discuss and informed of what Dr. TSalem Senatesuggested trying and to contact u[place]oreaback in the coming week when Dr. AAdele Schilderreturns if not effective as at present would not make any other change recommendations.  Patient agreed with plan upon calling her back and will call on 08/10/14 if sleep does not improve with Benedryl and to follow up with Dr. AAdele Schilderon any other concerns.

## 2014-08-05 ENCOUNTER — Ambulatory Visit (HOSPITAL_COMMUNITY): Payer: Self-pay | Admitting: Clinical

## 2014-08-26 ENCOUNTER — Ambulatory Visit (HOSPITAL_COMMUNITY): Payer: Self-pay | Admitting: Psychiatry

## 2014-09-08 ENCOUNTER — Telehealth (HOSPITAL_COMMUNITY): Payer: Self-pay

## 2014-09-08 NOTE — Telephone Encounter (Signed)
Left patient a message this nurse was calling her back to follow up on her questions about recent lab results.  Patient did not leave a message with any specific question so informed her we had results from labs completed 07/15/14 and this nurse would be glad to review any concerns with her.  Requested patient call this nurse back to discuss if needed.

## 2014-09-11 ENCOUNTER — Telehealth (HOSPITAL_COMMUNITY): Payer: Self-pay

## 2014-09-11 NOTE — Telephone Encounter (Signed)
Telephone call with patient who would like to be able to review her lab work from 07/15/14.  States she is able to see her PCP labs as she is set up with [facility] to view results but cannot see our labs.  Informed patient she would need to be registered for [facility] and will have someone call her back with directions as patient states she will see if her PCP today can just print out results from our labs as they see them too.  Requested patient call this nurse back on Monday 09/14/14 if she had any problems getting labs.

## 2014-09-14 ENCOUNTER — Encounter (HOSPITAL_COMMUNITY): Payer: Self-pay

## 2014-09-21 ENCOUNTER — Ambulatory Visit (HOSPITAL_COMMUNITY): Payer: Self-pay | Admitting: Psychiatry

## 2014-10-01 ENCOUNTER — Ambulatory Visit (INDEPENDENT_AMBULATORY_CARE_PROVIDER_SITE_OTHER): Payer: Medicare Other | Admitting: Clinical

## 2014-10-01 ENCOUNTER — Encounter (HOSPITAL_COMMUNITY): Payer: Self-pay | Admitting: Clinical

## 2014-10-01 DIAGNOSIS — F341 Dysthymic disorder: Secondary | ICD-10-CM

## 2014-10-01 NOTE — Progress Notes (Signed)
   THERAPIST PROGRESS NOTE  Session Time: 1:30 -2:28  Participation Level: Active  Behavioral Response: CasualAlertDepressed  Type of Therapy: Individual Therapy  Treatment Goals addressed: improve psychiatric symptoms,  Interventions: Motivational Interviewing,   Summary: Nicole [name] is a 74 y.o. female who presents with Dysthymia.  Suicidal/Homicidal: No -without intent/plan  Therapist Response: Mardene Celeste met with clinician for an individual session. Keita discussed his psychiatric symptoms and  her current life events. She shared that she had brought her husband because she believes her depressive state is affected greatly by her and her husband's relationship. She shared that she wants to have a close intimate relationship and he does not seem interested. Yanira's husband came prepared with a notebook filled with all the reasons why the marriage doesn't work. Most of the things he listed were from years ago or about their children. Clinician worked to redirect husband to answer the question of whether or not he would like to have a close relationship with his wife, give up the marriage, or have things remain as they are.  The husband returned each time to his list. He finally stated that he doesn't want a divorce but did not agree to work on the marriage.  When [name] was asked she clearly stated that she would like to work to improve the relationship. She shared that she would like to feel happy again. She shared that most of their arguments happen when he is intoxicated. She shared that she is interested in doing what was necessary to fix the relationship. At this point time was up. [name] shared that she would like to be able to kiss her husband without him rejecting her. Clinician suggested that if husband had it in his heart to do so, then that would be a good start towards mending the relationship. It is unclear if [name] received her kiss or not.  Plan: Return again  in 1 weeks.  Diagnosis: Axis I:   Dysthymia   Kati Riggenbach A, LCSW 10/01/2014

## 2014-10-12 ENCOUNTER — Encounter (HOSPITAL_COMMUNITY): Payer: Self-pay | Admitting: Psychiatry

## 2014-10-12 ENCOUNTER — Ambulatory Visit (INDEPENDENT_AMBULATORY_CARE_PROVIDER_SITE_OTHER): Payer: Medicare Other | Admitting: Psychiatry

## 2014-10-12 VITALS — BP 130/70 | HR 68 | Ht 63.0 in | Wt 129.5 lb

## 2014-10-12 DIAGNOSIS — F329 Major depressive disorder, single episode, unspecified: Secondary | ICD-10-CM

## 2014-10-12 DIAGNOSIS — F341 Dysthymic disorder: Secondary | ICD-10-CM

## 2014-10-12 MED ORDER — TRAZODONE HCL 50 MG PO TABS: ORAL_TABLET | ORAL | Status: DC

## 2014-10-12 NOTE — Progress Notes (Signed)
[facility] [id number] Progress Note  Nicole [name] [id number] 73 y.o.  10/12/2014 12:19 PM  Chief Complaint: I tried Remeron but it makes me somewhat manic.  I stop taking it.             History of Present Illness: Nicole [name] came for her followup appointment.  On her last visit we started her on Remeron but patient felt more restless , having nightmares and felt somewhat manic.  Initially she was recommended to reduce the dose however despite reducing the dose she continues to have these symptoms and she decided to stop.  She was given Klonopin from her primary care physician.  It is helping her sleep but she still have depression and anxiety.  She like to take something for insomnia as Klonopin helps going to sleep but does not keeping her in sleep.  She continues to have issues from her husband and together they saw [name] but patient was disappointed as husband did not let her talk but she is relieved that he agreed to schedule the next appointment.  Patient denies any suicidal thoughts or homicidal thought but admitted some time racing thoughts, poor sleep, nervousness and anxious.  She lives with her husband who has alcohol issues and the daughter who has bipolar and ADHD.  Patient admitted sometimes she feels that she is walking on eggshells.  We have tried multiple psychiatric medication but either patient developed side effects or did not felt better.  We also discussed her liver enzymes.  She has blood work which shows her liver enzymes are getting better.  She has hemoglobin A1c 5.9 and hemoglobin 15.8.  Patient wanted to have the results so she can take it to her primary care physician Dr. Laurann Montana for more discussion.  Patient denies any paranoia, hallucination, aggressive was any self abusive behavior.  Her appetite is okay.  Her vitals are stable.  Patient admitted sometime feeling hopeless or helpless but denies any active or passive suicidal parts or homicidal  thought.  Suicidal Ideation: No Plan Formed: No Patient has means to carry out plan: No  Homicidal Ideation: No Plan Formed: No Patient has means to carry out plan: No  Review of Systems: Psychiatric: Agitation: No Hallucination: No Depressed Mood: Yes Insomnia: Yes Hypersomnia: Yes Altered Concentration: No Feels Worthless: Yes Grandiose Ideas: No Belief In Special Powers: No New/Increased Substance Abuse: No Compulsions: No Review of Systems  Eyes: Negative for blurred vision.  Cardiovascular: Negative for chest pain and palpitations.  Musculoskeletal: Positive for back pain.  Skin: Negative for itching and rash.  Neurological: Negative for dizziness, tingling, tremors and headaches.  Psychiatric/Behavioral: Positive for depression. The patient is nervous/anxious and has insomnia.     Neurologic: Headache: No Seizure: No Paresthesias: No  Medical history Patient has history of hypertension and increased to enzyme.  She has chronic pain.  Her primary care physician is [name] .  Outpatient Encounter Prescriptions as of 10/12/2014  Medication Sig  . amLODipine (NORVASC) 5 MG tablet Take 1 tablet (5 mg total) by mouth daily.  [name] CALCIUM PO Take by mouth daily.  . clonazePAM (KLONOPIN) 1 MG tablet TAKE 1/2 TO 1 TABLET BY MOUTH TWICE DAILY AS NEEDED  . levothyroxine (SYNTHROID, LEVOTHROID) 50 MCG tablet Take 1 tablet by mouth daily.  . Milk Thistle 250 MG CAPS Take 1 capsule by mouth daily.  . Multiple Vitamin (MULTIVITAMIN) capsule Take 1 capsule by mouth daily.  . Pitavastatin Calcium 2 MG TABS Take  1 tablet (2 mg total) by mouth daily.  . traZODone (DESYREL) 50 MG tablet Take 1/2 to 1 tab st bed time  . valsartan (DIOVAN) 320 MG tablet TAKE 1 TABLET BY MOUTH EVERY DAY  . [DISCONTINUED] ClonazePAM (KLONOPIN PO) Take 0.5-1 mg by mouth 2 (two) times daily as needed.   . [DISCONTINUED] mirtazapine (REMERON) 15 MG tablet Take 1 tablet (15 mg total) by mouth at  bedtime. (Patient not taking: Reported on 10/01/2014)   No facility-administered encounter medications on file as of 10/12/2014.    Past Psychiatric History/Hospitalization(s): Patient denies any previous history of psychiatric inpatient treatment or suicidal attempt.  She was seeing Dr. [name] 38 years ago when she was having issues in her marriage.  In the past she has seen therapist and taking medication Lexapro, Cymbalta, BuSpar, amitriptyline and Wellbutrin but stopped due to side effects.  Patient denies any history of psychosis mania paranoia or aggression.  We have tried trazodone , lithium and recently Prozac but patient does not see any improvement.  She liked Lamictal but she developed a rash .  She was offered Abilify but she could not afford.   We tried lithium but she did not like the effect.  Recently we tried Remeron but she have nightmares and it was discontinued.     Anxiety: Yes Bipolar Disorder: No Depression: Yes Mania: No Psychosis: No Schizophrenia: No Personality Disorder: No Hospitalization for psychiatric illness: No History of Electroconvulsive Shock Therapy: No Prior Suicide Attempts: No  Physical Exam: Constitutional:  BP 130/70 mmHg  Pulse 68  Ht 5' 3"  (1.6 m)  Wt 129 lb 8 oz (58.741 kg)  BMI 22.95 kg/m2  Recent Results (from the past 2160 hour(s))  TSH     Status: None   Collection Time: 07/15/14  9:05 AM  Result Value Ref Range   TSH 3.500 0.350 - 4.500 uIU/mL  CBC with Differential/Platelet     Status: Abnormal   Collection Time: 07/15/14  9:05 AM  Result Value Ref Range   WBC 7.9 4.0 - 10.5 K/uL   RBC 4.82 3.87 - 5.11 MIL/uL   Hemoglobin 15.8 (H) 12.0 - 15.0 g/dL   HCT 45.9 36.0 - 46.0 %   MCV 95.2 78.0 - 100.0 fL   MCH 32.8 26.0 - 34.0 pg   MCHC 34.4 30.0 - 36.0 g/dL   RDW 13.4 11.5 - 15.5 %   Platelets 187 150 - 400 K/uL   MPV 10.0 8.6 - 12.4 fL   Neutrophils Relative % 50 43 - 77 %   Neutro Abs 4.0 1.7 - 7.7 K/uL   Lymphocytes  Relative 35 12 - 46 %   Lymphs Abs 2.8 0.7 - 4.0 K/uL   Monocytes Relative 11 3 - 12 %   Monocytes Absolute 0.9 0.1 - 1.0 K/uL   Eosinophils Relative 3 0 - 5 %   Eosinophils Absolute 0.2 0.0 - 0.7 K/uL   Basophils Relative 1 0 - 1 %   Basophils Absolute 0.1 0.0 - 0.1 K/uL   Smear Review Criteria for review not met   COMPLETE METABOLIC PANEL WITH GFR     Status: Abnormal   Collection Time: 07/15/14  9:05 AM  Result Value Ref Range   Sodium 135 135 - 145 mEq/L   Potassium 4.2 3.5 - 5.3 mEq/L   Chloride 97 96 - 112 mEq/L   CO2 26 19 - 32 mEq/L   Glucose, Bld 102 (H) 70 - 99 mg/dL   BUN  10 6 - 23 mg/dL   Creat 0.74 0.50 - 1.10 mg/dL   Total Bilirubin 0.7 0.2 - 1.2 mg/dL   Alkaline Phosphatase 90 39 - 117 U/L   AST 81 (H) 0 - 37 U/L   ALT 78 (H) 0 - 35 U/L   Total Protein 7.8 6.0 - 8.3 g/dL   Albumin 4.1 3.5 - 5.2 g/dL   Calcium 10.3 8.4 - 10.5 mg/dL   GFR, Est African American >89 mL/min   GFR, Est Non African American 81 mL/min    Comment:   The estimated GFR is a calculation valid for adults (>=27 years old) that uses the CKD-EPI algorithm to adjust for age and sex. It is   not to be used for children, pregnant women, hospitalized patients,    patients on dialysis, or with rapidly changing kidney function. According to the NKDEP, eGFR >89 is normal, 60-89 shows mild impairment, 30-59 shows moderate impairment, 15-29 shows severe impairment and <15 is ESRD.     Hemoglobin A1c     Status: Abnormal   Collection Time: 07/15/14  9:05 AM  Result Value Ref Range   Hgb A1c MFr Bld 5.9 (H) <5.7 %    Comment:                                                                        According to the ADA Clinical Practice Recommendations for 2011, when HbA1c is used as a screening test:     >=6.5%   Diagnostic of Diabetes Mellitus            (if abnormal result is confirmed)   5.7-6.4%   Increased risk of developing Diabetes Mellitus   References:Diagnosis and Classification of  Diabetes Mellitus,Diabetes WJXB,1478,29(FAOZH 1):S62-S69 and Standards of Medical Care in         Diabetes - 2011,Diabetes Care,2011,34 (Suppl 1):S11-S61.      Mean Plasma Glucose 123 (H) <117 mg/dL  Lipid panel     Status: Abnormal   Collection Time: 07/15/14  9:05 AM  Result Value Ref Range   Cholesterol 215 (H) 0 - 200 mg/dL    Comment: ATP III Classification:       < 200        mg/dL        Desirable      200 - 239     mg/dL        Borderline High      >= 240        mg/dL        High      Triglycerides 167 (H) <150 mg/dL   HDL 43 (L) >=46 mg/dL   Total CHOL/HDL Ratio 5.0 Ratio   VLDL 33 0 - 40 mg/dL   LDL Cholesterol 139 (H) 0 - 99 mg/dL    Comment:   Total Cholesterol/HDL Ratio:CHD Risk                        Coronary Heart Disease Risk Table  Men       Women          1/2 Average Risk              3.4        3.3              Average Risk              5.0        4.4           2X Average Risk              9.6        7.1           3X Average Risk             23.4       11.0 Use the calculated Patient Ratio above and the CHD Risk table  to determine the patient's CHD Risk. ATP III Classification (LDL):       < 100        mg/dL         Optimal      100 - 129     mg/dL         Near or Above Optimal      130 - 159     mg/dL         Borderline High      160 - 189     mg/dL         High       > 190        mg/dL         Very High       General Appearance: well nourished  Musculoskeletal: Strength & Muscle Tone: within normal limits Gait & Station: normal Patient leans: N/A  Mental status examination Patient is well groomed well dressed female who appears to be in her appropriate age. She appears anxious, but cooperative. She maintained fair eye contact. She described her mood depressed and her affect is constricted.  She denies any active or passive suicidal thoughts or homicidal thoughts. She denies any auditory or visual  hallucination. There were no flight of idea or loose association. Her speech is soft clear and coherent with normal tone and volume. Her attention and concentration is fair.  There were no delusions, paranoia or any obsessive thoughts.  Her fund of knowledge is adequate.  Her memory is okay.  There were no tremors or shakes present. She's alert and oriented x3. Her intermediate recall is good. Her insight judgment and pulse control is okay.   Established Problem, Stable/Improving (1), Review of Psycho-Social Stressors (1), Review or order clinical lab tests (1), Established Problem, Worsening (2), Review of Last Therapy Session (1), Review of Medication Regimen & Side Effects (2) and Review of New Medication or Change in Dosage (2)  Assessment: Axis I: Depressive disorder NOS  Axis II: Deferred  Axis III: See medical history  Plan: I will discontinue Remeron since patient to not like the side effects especially nightmares.  After a long discussion she wants to try again low-dose trazodone which helps her sleep.  She is taking Klonopin 1 mg half tablet prescribed by her primary care physician.  I encourage her to see [name] for marital counseling.  We also discuss in length about her blood work results.  We talk about increased liver enzymes which is slowly getting better.  She will bring her blood work  results to discuss further with her primary care physician [name].  I recommended to call us back if she has any question or any concern.  I will see her again in 4 weeks.  Discuss safety plan that anytime having active suicidal thoughts or homicidal thoughts then patient need to call 911 or go to a local emergency room. Time spent 25 minutes.  More than 50% of the time spent in psychotic education, counseling" of care.  Patient was also given enough time to discuss about her medication, diagnoses and prognosis.    ARFEEN,SYED T., MD 10/12/2014

## 2014-11-04 ENCOUNTER — Ambulatory Visit (INDEPENDENT_AMBULATORY_CARE_PROVIDER_SITE_OTHER): Payer: Medicare Other | Admitting: Clinical

## 2014-11-04 ENCOUNTER — Encounter (HOSPITAL_COMMUNITY): Payer: Self-pay | Admitting: Clinical

## 2014-11-04 DIAGNOSIS — F341 Dysthymic disorder: Secondary | ICD-10-CM

## 2014-11-04 NOTE — Progress Notes (Addendum)
   THERAPIST PROGRESS NOTE  Session Time: 10:00 -10:58  Participation Level: Active  Behavioral Response: NeatAlertDepressed  Type of Therapy: Individual Therapy  Treatment Goals addressed: improve psychiatric symptoms, Improve faulty thinking patterns,   Interventions: d Motivational Interviewing,   Summary: Nicole [name] is a 74 y.o. female who presents with Dysthymia.  Suicidal/Homicidal: No -without intent/plan  Therapist Response: Mardene Celeste met with clinician for an individual session. [name] discussed his psychiatric symptoms and her current life events. She shared that her and her husband's relationship remains much the same. That the relationship is one of the main causes of her depression. Client and clinician discussed what was in her power to change and what was not in her power to change. She shared that she recognize that she was only capable of changing herself but her tendency was to redirect conversation back to him or children. [name] shared that she is jealous of other couples her age who have intimacy by holding hands or by a doing things together. Clinician asked if there was anything that [name] was doing to block intimacy. She shared that they both held back waiting for the other person to apologize or give in some sort away. Clinician asked if this method was working for her. She shared that it was not working for either of them and that they were not getting any younger. Clinician asked what. [name] do to improve the situation regardless of her husband's actions Streiff had previously said that she wanted to remain in the marriage). [name] outlined a few things such as being more affectionate her self and  not talking negative  and focusing on her growth. Clinician asked how willing to she was to give these a shot. [name] said that she was willing to give them a shot and report back next session on whether or not making these changes improved 1) her  relationship or 2) her depression.  Plan: Return again in 2 weeks.  Diagnosis: Axis I:   Dysthymia   Chantee Cerino A, LCSW 11/04/2014

## 2014-11-05 ENCOUNTER — Other Ambulatory Visit (HOSPITAL_COMMUNITY): Payer: Self-pay | Admitting: Psychiatry

## 2014-11-09 ENCOUNTER — Encounter (HOSPITAL_COMMUNITY): Payer: Self-pay | Admitting: Clinical

## 2014-11-09 ENCOUNTER — Ambulatory Visit (INDEPENDENT_AMBULATORY_CARE_PROVIDER_SITE_OTHER): Payer: Medicare Other | Admitting: Psychiatry

## 2014-11-09 ENCOUNTER — Encounter (HOSPITAL_COMMUNITY): Payer: Self-pay | Admitting: Psychiatry

## 2014-11-09 VITALS — BP 120/82 | HR 56 | Ht 63.0 in | Wt 129.0 lb

## 2014-11-09 DIAGNOSIS — F341 Dysthymic disorder: Secondary | ICD-10-CM | POA: Diagnosis not present

## 2014-11-09 MED ORDER — TRAZODONE HCL 100 MG PO TABS: 100.0000 mg | ORAL_TABLET | Freq: Every day | ORAL | Status: DC

## 2014-11-09 NOTE — Progress Notes (Signed)
[name] [phone] Progress Note  [name] [id number] 74 y.o.  11/09/2014 10:56 AM  Chief Complaint: I like trazodone but I need a higher dose.  I tried taking 75 mg and it helps me and does not make me groggy.               History of Present Illness: Nicole [name] came for her followup appointment.  On her last visit we started her on trazodone since she feels restless with the Remeron.  She was given 50 mg of trazodone to take half to one tablet but patient see more improvement but she take up to 75 mg.  She slept 5-6 hours.  She is also seeing [name] on a regular basis.  She still have anxiety and nervousness but denies any irritability, anger, mood swing.  She recently seen her primary care physician [name] and discuss her blood results.  She is happy that her liver enzymes are getting better.  She admitted 1 drink in past 4 weeks when she had a reunion.  She denies it was binge .  She also denies any intoxication or any withdrawal symptoms.  Patient is tolerating trazodone without any side effects.  She has no shakes, tremors or any EPS.  She endorse mostly insomnia due to her anxiety but she had stopped Klonopin since trazodone working very well for her sleep.  Her husband continues to drinking but she is relieved that he has cut down his drinking.  Patient denies any crying spells or any feeling of hopelessness or worthlessness.  Patient wants to know more about bipolar as she realizes her daughter has bipolar disorder.  Patient denies any paranoia, hallucination or any aggressive behavior.  She is open to take higher dose of trazodone.  Her appetite is okay.  Her vitals are stable.  Her pulse is slightly decreased but she is taking 2 antihypertensive medication.  She has appointment with her primary care physician and she will discuss if she continues to take 2 medication for her blood pressure.    Suicidal Ideation: No Plan Formed: No Patient has means to  carry out plan: No  Homicidal Ideation: No Plan Formed: No Patient has means to carry out plan: No  Review of Systems: Psychiatric: Agitation: No Hallucination: No Depressed Mood: No Insomnia: No Hypersomnia: No Altered Concentration: No Feels Worthless: No Grandiose Ideas: No Belief In Special Powers: No New/Increased Substance Abuse: No Compulsions: No Review of Systems  Eyes: Negative for blurred vision.  Cardiovascular: Negative for chest pain and palpitations.  Musculoskeletal: Positive for back pain.  Skin: Negative for itching and rash.  Neurological: Negative for dizziness, tingling, tremors and headaches.    Neurologic: Headache: No Seizure: No Paresthesias: No  Medical history Patient has history of hypertension and increased to enzyme.  She has chronic pain.  Her primary care physician is [name] .  Outpatient Encounter Prescriptions as of 11/09/2014  Medication Sig  . amLODipine (NORVASC) 5 MG tablet Take 1 tablet (5 mg total) by mouth daily.  [name] CALCIUM PO Take by mouth daily.  . clonazePAM (KLONOPIN) 1 MG tablet TAKE 1/2 TO 1 TABLET BY MOUTH TWICE DAILY AS NEEDED  . HYDROcodone-acetaminophen (NORCO/VICODIN) 5-325 MG tablet Take by mouth. for pain  . levothyroxine (SYNTHROID, LEVOTHROID) 50 MCG tablet Take 1 tablet by mouth daily.  . Milk Thistle 250 MG CAPS Take 1 capsule by mouth daily.  . Multiple Vitamin (MULTIVITAMIN) capsule Take 1 capsule by mouth  daily.  . Pitavastatin Calcium 2 MG TABS Take 1 tablet (2 mg total) by mouth daily.  . traZODone (DESYREL) 100 MG tablet Take 1 tablet (100 mg total) by mouth at bedtime.  . valsartan (DIOVAN) 320 MG tablet TAKE 1 TABLET BY MOUTH EVERY DAY  . [DISCONTINUED] traZODone (DESYREL) 50 MG tablet Take 1/2 to 1 tab st bed time   No facility-administered encounter medications on file as of 11/09/2014.    Past Psychiatric History/Hospitalization(s): Patient denies any previous history of psychiatric  inpatient treatment or suicidal attempt.  She was seeing Dr. [name] 38 years ago when she was having issues in her marriage.  In the past she has seen therapist and taking medication Lexapro, Cymbalta, BuSpar, amitriptyline and Wellbutrin but stopped due to side effects.  Patient denies any history of psychosis mania paranoia or aggression.  We have tried trazodone , lithium and recently Prozac but patient does not see any improvement.  She liked Lamictal but she developed a rash .  She was offered Abilify but she could not afford.   We tried lithium but she did not like the effect.  Recently we tried Remeron but she have nightmares and it was discontinued.     Anxiety: Yes Bipolar Disorder: No Depression: Yes Mania: No Psychosis: No Schizophrenia: No Personality Disorder: No Hospitalization for psychiatric illness: No History of Electroconvulsive Shock Therapy: No Prior Suicide Attempts: No  Physical Exam: Constitutional:  BP 120/82 mmHg  Pulse 56  Ht 5\' 3"  (1.6 m)  Wt 129 lb (58.514 kg)  BMI 22.86 kg/m2  No results found for this or any previous visit (from the past 2160 hour(s)).  General Appearance: well nourished  Musculoskeletal: Strength & Muscle Tone: within normal limits Gait & Station: normal Patient leans: N/A  Mental status examination Patient is well groomed well dressed female who appears to be in her appropriate age.  She is pleasant and cooperative.  She maintained good eye contact.  She described her mood euthymic and her affect is improved from the past.  She denies any active or passive suicidal thoughts or homicidal thoughts. She denies any auditory or visual hallucination. There were no flight of idea or loose association. Her speech is soft clear and coherent with normal tone and volume. Her attention and concentration is fair.  There were no delusions, paranoia or any obsessive thoughts.  Her fund of knowledge is adequate.  Her memory is okay.  There were no  tremors or shakes present. She's alert and oriented x3. Her intermediate recall is good. Her insight judgment and pulse control is okay.   Established Problem, Stable/Improving (1), Review of Psycho-Social Stressors (1), Review of Last Therapy Session (1), Review of Medication Regimen & Side Effects (2) and Review of New Medication or Change in Dosage (2)  Assessment: Axis I: Depressive disorder NOS  Axis II: Deferred  Axis III: See medical history  Plan: Patient like trazodone and she is willing to increase the dose.  She had tried up to 75 mg and tolerated very well.  I recommended to try 100 mg at bedtime .  We also discussed medication side effects especially postural hypotension with trazodone .  Her pulse is slightly decreased and she is taking 2 antihypertensive medication.  Patient will call primary care physician to discuss her blood pressure medication .  However I recommended if she feel dizzy or having grogginess then she should call us.  She is also taking pain medication for her shoulder pain  which is due to rotator cuff injury.  Discuss alcohol abuse and dependency.  Encouraged to keep appointment with therapist on a regular basis.  She was given Klonopin from her primary care physician however she has been not taking it. Discuss safety plan that anytime having active suicidal thoughts or homicidal thoughts then patient need to call 911 or go to a local emergency room.   Tyrrell Stephens T., MD 11/09/2014

## 2014-11-19 ENCOUNTER — Other Ambulatory Visit: Payer: Self-pay | Admitting: Internal Medicine

## 2014-11-30 ENCOUNTER — Ambulatory Visit (INDEPENDENT_AMBULATORY_CARE_PROVIDER_SITE_OTHER): Payer: Medicare Other | Admitting: Clinical

## 2014-11-30 ENCOUNTER — Encounter (HOSPITAL_COMMUNITY): Payer: Self-pay

## 2014-11-30 ENCOUNTER — Encounter (HOSPITAL_COMMUNITY): Payer: Self-pay | Admitting: Clinical

## 2014-11-30 DIAGNOSIS — F341 Dysthymic disorder: Secondary | ICD-10-CM | POA: Diagnosis not present

## 2014-11-30 NOTE — Progress Notes (Signed)
   THERAPIST PROGRESS NOTE  Session Time: 9:03 -10:00  Participation Level: Active  Behavioral Response: CasualAlertDepressed  Type of Therapy: Individual Therapy  Treatment Goals addressed: improve psychiatric symptoms, Improve faulty thinking patterns, stress management  Interventions: CBT and Motivational Interviewing,   Summary: Nicole [name] is a 74 y.o. female who presents with Dysthymia.  Suicidal/Homicidal: No -without intent/plan  Therapist Response: Nicole [name] met with clinician for an individual session. Nicole [name] discussed his psychiatric symptoms and her current life events. Nicole [name] stated that she continues to experience depression and feel unhappy about life. She shard she had a weekend break, that she went to the beach for the weekend. She shared that she continues to be unhappy in her marriage and her family life.  She shares that her husband continues to be unaffectionate and indifferent to her.  Client shared about other dividing factors in their family. Clinician asked what she wanted. She shared that she would like to be validated by her husband.  Nicole [name] shared that she would like her family to be connected and to have intimacy. Clinician asked what Nicole [name] could do without changing anyone else that would create more connection and intimacy in her life. [name] would share about how others did wrong when asked about herself and clinician brought her back to topic several times. Nicole [name] said that upon thinking about it she wasn't sure if she would like more connection and intimacy because she has been so hurt in the past. Client and clinician discussed how she might be sending out mixed signals if this was true.  Client and clinician discussed choice, the pros and cons of waiting for others, and the power of doing what you would like more of. Nicole [name] and clinician discussed the possibility of taking the focus off her husband to have her needs met. For example if she  would like more intimacy (non sexual) were there not others that she could share and experience with. Nicole [name] had shared prior that the more she asks for connection and intimacy with her husband the more resistant he becomes.  Client and clinician discussed things she could do to reduce her stress. she shared that she has decided to do some volunteer work which she believes will be a break and take her focus off her home situation for a while.    Plan: Return again in 2 weeks.  Diagnosis: Axis I:   Dysthymia    , A, LCSW 11/30/2014  

## 2014-12-24 ENCOUNTER — Ambulatory Visit (HOSPITAL_COMMUNITY): Payer: Self-pay | Admitting: Clinical

## 2014-12-30 ENCOUNTER — Other Ambulatory Visit: Payer: Self-pay

## 2014-12-30 DIAGNOSIS — Z1231 Encounter for screening mammogram for malignant neoplasm of breast: Secondary | ICD-10-CM

## 2015-01-14 ENCOUNTER — Ambulatory Visit (HOSPITAL_COMMUNITY): Payer: Self-pay | Admitting: Clinical

## 2015-01-24 HISTORY — PX: COLONOSCOPY WITH ESOPHAGOGASTRODUODENOSCOPY (EGD): SHX5779

## 2015-02-03 ENCOUNTER — Ambulatory Visit
Admission: RE | Admit: 2015-02-03 | Discharge: 2015-02-03 | Disposition: A | Discharge status: Home / Self Care | Payer: Medicare Other | Source: Ambulatory Visit

## 2015-02-03 DIAGNOSIS — Z1231 Encounter for screening mammogram for malignant neoplasm of breast: Secondary | ICD-10-CM

## 2015-02-04 ENCOUNTER — Ambulatory Visit (HOSPITAL_COMMUNITY): Payer: Self-pay | Admitting: Clinical

## 2015-02-09 ENCOUNTER — Ambulatory Visit (HOSPITAL_COMMUNITY): Payer: Self-pay | Admitting: Psychiatry

## 2015-02-18 ENCOUNTER — Encounter: Payer: Self-pay | Admitting: Internal Medicine

## 2015-02-18 ENCOUNTER — Ambulatory Visit (INDEPENDENT_AMBULATORY_CARE_PROVIDER_SITE_OTHER): Payer: Medicare Other | Admitting: Internal Medicine

## 2015-02-18 VITALS — BP 160/72 | HR 48 | Ht 63.0 in | Wt 123.4 lb

## 2015-02-18 DIAGNOSIS — K760 Fatty (change of) liver, not elsewhere classified: Secondary | ICD-10-CM | POA: Diagnosis not present

## 2015-02-18 DIAGNOSIS — Z79899 Other long term (current) drug therapy: Secondary | ICD-10-CM | POA: Diagnosis not present

## 2015-02-18 DIAGNOSIS — Z889 Allergy status to unspecified drugs, medicaments and biological substances status: Secondary | ICD-10-CM

## 2015-02-18 DIAGNOSIS — Z789 Other specified health status: Secondary | ICD-10-CM

## 2015-02-18 DIAGNOSIS — I10 Essential (primary) hypertension: Secondary | ICD-10-CM

## 2015-02-18 DIAGNOSIS — E785 Hyperlipidemia, unspecified: Secondary | ICD-10-CM

## 2015-02-18 DIAGNOSIS — R0789 Other chest pain: Secondary | ICD-10-CM | POA: Diagnosis not present

## 2015-02-18 MED ORDER — CHLORTHALIDONE 25 MG PO TABS: 25.0000 mg | ORAL_TABLET | Freq: Every day | ORAL | Status: DC

## 2015-02-18 MED ORDER — METOPROLOL SUCCINATE ER 25 MG PO TB24: 25.0000 mg | ORAL_TABLET | Freq: Every day | ORAL | Status: DC

## 2015-02-18 NOTE — Patient Instructions (Signed)
[name] has recommended making the following medication changes: DECREASE Metoprolol to 25 mg daily START Chlorthalidone 25 mg - take 1 tablet by mouth daily  Your physician recommends that you return for lab work [facility].  Your physician recommends that you schedule a follow-up appointment in 1-2 weeks with our pharmacist, Tommy Medal.  [name] recommends that you schedule a follow-up appointment in 3 months.  If you need a refill on your cardiac medications before your next appointment, please call your pharmacy.

## 2015-02-19 LAB — BASIC METABOLIC PANEL
BUN: 14 mg/dL (ref 7–25)
CO2: 27 mmol/L (ref 20–31)
Calcium: 9 mg/dL (ref 8.6–10.4)
Chloride: 96 mmol/L — ABNORMAL LOW (ref 98–110)
Creat: 0.73 mg/dL (ref 0.60–0.93)
GLUCOSE: 115 mg/dL — AB (ref 65–99)
POTASSIUM: 3.8 mmol/L (ref 3.5–5.3)
SODIUM: 134 mmol/L — AB (ref 135–146)

## 2015-02-19 LAB — MAGNESIUM: Magnesium: 2 mg/dL (ref 1.5–2.5)

## 2015-02-21 NOTE — Progress Notes (Signed)
02/21/2015 TAKIA MONTEL 1940/04/29 [id number]   HPI:  Nicole [name] is a 75 y.o. female patient of [name], with a PMH below who presents today for a blood pressure check.  I last saw Ms. Marconi about 1 month ago when we added amlodipine 5mg  each day.  Today she has no complaints.  At her last visit she brought her home BP cuff, which was found to be accurate within 10 points.  Since the addition of amlodipine her home readings have dropped significantly, in that now they average in the Q000111Q systolic.  Per patient there was only one reading in the Q000111Q systolic and nothing higher.  She does not use tobacco products or drink alcohol. She drinks minimal caffeine.  She does not add salt to her cooking, although she does eat some prepared foods.  She is going to her local [employer] 3-4 times per week and getting in a good workout of stair stepper and treadmill.  Because of a torn rotator cuff she is not able to do much in the way of weights.    Mrs. Pamplona returns today for follow-up. She reports she's been significantly fatigued for the past several months and this is unlike her. It appears that her mood is somewhat depressed. She continues to exercise but does not seem to be helping her. She reports having her thyroid checked fairly regularly and maybe her levothyroxine was increased recently to 50 g. Blood pressure has been well controlled. She currently is being treated for sinus infection on amoxicillin. She has had some lightheadedness and dizziness. She never started Livalo due to questions about the cholesterol medicine and liver function. She does have a history of mild abnormalities in her LFTs. This of course was the reason that I recommended that medication at our last office visit. She was also provided with samples which she has no idea where they are at.  Mrs. Morasch returns today for follow-up. I previously tried her on low-dose Livalo since she had a history of elevated liver  enzymes, but a recheck showed that this also caused her liver enzymes to rise. She was then taken off of this. She's currently asymptomatic. Blood pressure is mildly elevated today.  Current Outpatient Prescriptions  Medication Sig Dispense Refill  . CALCIUM PO Take by mouth daily.    . clonazePAM (KLONOPIN) 1 MG tablet TAKE 1/2 TO 1 TABLET BY MOUTH TWICE DAILY AS NEEDED  0  . HYDROcodone-acetaminophen (NORCO/VICODIN) 5-325 MG tablet Take by mouth. for pain  0  . levothyroxine (SYNTHROID, LEVOTHROID) 50 MCG tablet Take 1 tablet by mouth daily.  3  . Milk Thistle 250 MG CAPS Take 1 capsule by mouth daily.    . Multiple Vitamin (MULTIVITAMIN) capsule Take 1 capsule by mouth daily.    . Pitavastatin Calcium 2 MG TABS Take 1 tablet (2 mg total) by mouth daily. 30 tablet 6  . traZODone (DESYREL) 100 MG tablet Take 1 tablet (100 mg total) by mouth at bedtime. 30 tablet 2  . valsartan (DIOVAN) 320 MG tablet TAKE 1 TABLET BY MOUTH EVERY DAY 30 tablet 6  . chlorthalidone (HYGROTON) 25 MG tablet Take 1 tablet (25 mg total) by mouth daily. 30 tablet 11  . metoprolol succinate (TOPROL-XL) 25 MG 24 hr tablet Take 1 tablet (25 mg total) by mouth daily. 30 tablet 11  . Vitamin D, Ergocalciferol, (DRISDOL) 50000 units CAPS capsule Take 50,000 Units by mouth once a week. Take 1 tab daily  1   No current facility-administered medications for this visit.    Allergies  Allergen Reactions  . Statins Other (See Comments)    Muscle aches  . Lamictal [Lamotrigine] Rash    Past Medical History  Diagnosis Date  . HTN (hypertension)   . Increased liver enzymes   . Hyperlipemia   . GERD (gastroesophageal reflux disease)   . Diabetes ([place])     Blood pressure 160/72, pulse 48, height 5\' 3"  (1.6 m), weight 123 lb 7 oz (55.991 kg).   EXAM: GEN: Awake, NAD HEENT: PERRLA, EOMI Lungs: Clear Cardiovascular: Regular rate and rhythm Abdomen: Soft, nontender Extremities: No edema Neurologic: Grossly  nonfocal Psych: Flat mood, affect, unusual and tangential responses to questions.  EKG: Sinus bradycardia at 48  IMPRESSION: 1. Hypertension-uncontrolled 2. Bradycardia 3. Atypical chest pain 4. Dyslipidemia with statin intolerance due to nonalcoholic fatty liver disease, elevated LFTs  PLAN:   1.  Mrs. Ratkovich seems to be doing well although her heart rate is low today at 48. Blood pressure is elevated. I recommend decreasing her metoprolol to 25 mg daily. I will add chlorthalidone 25 mg daily. She will need repeat labs next week and follow-up in a few weeks with Erasmo Downer our pharmacist for further blood pressure management.  Pixie Casino, MD, [facility] Attending Cardiologist [place]

## 2015-02-22 ENCOUNTER — Other Ambulatory Visit (HOSPITAL_COMMUNITY): Payer: Self-pay | Admitting: Psychiatry

## 2015-02-22 DIAGNOSIS — F341 Dysthymic disorder: Secondary | ICD-10-CM

## 2015-02-26 NOTE — Telephone Encounter (Signed)
E-scribed a one time refill of patient's prescribed Trazodone per order of Dr. Adele Schilder this date with note no further refills until patient evaluated as she canceled appointment on 02/09/15 and has not rescheduled at this time.

## 2015-03-04 ENCOUNTER — Ambulatory Visit (INDEPENDENT_AMBULATORY_CARE_PROVIDER_SITE_OTHER): Payer: Medicare Other | Admitting: Pharmacist Clinician (PhC)/ Clinical Pharmacy Specialist

## 2015-03-04 VITALS — BP 108/60 | HR 52 | Ht 63.0 in | Wt 124.3 lb

## 2015-03-04 DIAGNOSIS — I10 Essential (primary) hypertension: Secondary | ICD-10-CM

## 2015-03-04 NOTE — Patient Instructions (Signed)
Return for a a follow up appointment in 3-4 weeks    Your blood pressure today is 108/60   Check your blood pressure at home daily and keep record of the readings.  Take your BP meds as follows: stop metoprolol, continue with valsartan and chlorthalidone  Bring all of your meds, your BP cuff and your record of home blood pressures to your next appointment.  Exercise as you're able, try to walk approximately 30 minutes per day.  Keep salt intake to a minimum, especially watch canned and prepared boxed foods.  Eat more fresh fruits and vegetables and fewer canned items.  Avoid eating in fast food restaurants.    HOW TO TAKE YOUR BLOOD PRESSURE: . Rest 5 minutes before taking your blood pressure. .  Don't smoke or drink caffeinated beverages for at least 30 minutes before. . Take your blood pressure before (not after) you eat. . Sit comfortably with your back supported and both feet on the floor (don't cross your legs). . Elevate your arm to heart level on a table or a desk. . Use the proper sized cuff. It should fit smoothly and snugly around your bare upper arm. There should be enough room to slip a fingertip under the cuff. The bottom edge of the cuff should be 1 inch above the crease of the elbow. . Ideally, take 3 measurements at one sitting and record the average.

## 2015-03-05 ENCOUNTER — Encounter: Payer: Self-pay | Admitting: Pharmacist Clinician (PhC)/ Clinical Pharmacy Specialist

## 2015-03-05 NOTE — Progress Notes (Signed)
     03/05/2015 TEANA KOCA 11-10-1940 [id number]   HPI:  Nicole [name] is a 75 y.o. female patient of [name], with a PMH below who presents today for hypertension clinic evaluation.  I last saw Ms. Degeorge in 2015 for blood pressure management.  She states that it has been mostly well controlled, but was elevated when she saw Dr. Debara Pickett a few weeks ago.    Cardiac Hx: hyperlipidemia, DM  Family Hx: mother had stroke and brother had MI (age 66)  Social Hx: no tobacco or alcohol, drinks minimal caffeine  Diet: mostly eats at home and does not add salt to her meals  Exercise: not asked today[facility]  Home BP readings: readings for past week at home average 130/63, however the range is 95/41 to 167/88.  Her heart rates have been consistently below 70 and several in the 50's  Current antihypertensive medications: chlorthalidone 25 mg qd, metoprolol 25 mg qd, valsartan 320 mg qd   Wt Readings from Last 3 Encounters:  03/04/15 124 lb 4.8 oz (56.382 kg)  02/18/15 123 lb 7 oz (55.991 kg)  11/09/14 129 lb (58.514 kg)   BP Readings from Last 3 Encounters:  03/04/15 108/60  02/18/15 160/72  11/09/14 120/82   Pulse Readings from Last 3 Encounters:  03/04/15 52  02/18/15 48  11/09/14 56    Current Outpatient Prescriptions  Medication Sig Dispense Refill  . CALCIUM PO Take by mouth daily.    . chlorthalidone (HYGROTON) 25 MG tablet Take 1 tablet (25 mg total) by mouth daily. 30 tablet 11  . clonazePAM (KLONOPIN) 1 MG tablet TAKE 1/2 TO 1 TABLET BY MOUTH TWICE DAILY AS NEEDED  0  . HYDROcodone-acetaminophen (NORCO/VICODIN) 5-325 MG tablet Take by mouth. for pain  0  . levothyroxine (SYNTHROID, LEVOTHROID) 50 MCG tablet Take 1 tablet by mouth daily.  3  . metoprolol succinate (TOPROL-XL) 25 MG 24 hr tablet Take 1 tablet (25 mg total) by mouth daily. 30 tablet 11  . Milk Thistle 250 MG CAPS Take 1 capsule by mouth daily.    . Multiple  Vitamin (MULTIVITAMIN) capsule Take 1 capsule by mouth daily.    . Pitavastatin Calcium 2 MG TABS Take 1 tablet (2 mg total) by mouth daily. 30 tablet 6  . traZODone (DESYREL) 100 MG tablet TAKE 1 TABLET (100 MG TOTAL) BY MOUTH AT BEDTIME. 30 tablet 0  . valsartan (DIOVAN) 320 MG tablet TAKE 1 TABLET BY MOUTH EVERY DAY 30 tablet 6  . Vitamin D, Ergocalciferol, (DRISDOL) 50000 units CAPS capsule Take 50,000 Units by mouth once a week. Take 1 tab daily  1   No current facility-administered medications for this visit.    Allergies  Allergen Reactions  . Statins Other (See Comments)    Muscle aches  . Lamictal [Lamotrigine] Rash    Past Medical History  Diagnosis Date  . HTN (hypertension)   . Increased liver enzymes   . Hyperlipemia   . GERD (gastroesophageal reflux disease)   . Diabetes ([place])     Blood pressure 108/60, pulse 52, height 5\' 3"  (1.6 m), weight 124 lb 4.8 oz (56.382 kg).    Tommy Medal PharmD [facility]

## 2015-03-05 NOTE — Assessment & Plan Note (Addendum)
Nicole [name]'s blood pressure has been very erratic for the past week, with systolic readings ranging from 95 to 167.  Her heart rate has been steadily in the 50's and low 60's.  For now I have asked her to stop the metoprolol and we will see if we can get her heart rate up a little as well as her BP, which was 108/60 in the office today.  I have asked her to continue with daily home BP monitoring and I will see her back in a month.

## 2015-04-01 ENCOUNTER — Ambulatory Visit: Payer: Self-pay | Admitting: Pharmacist Clinician (PhC)/ Clinical Pharmacy Specialist

## 2015-04-03 DIAGNOSIS — K117 Disturbances of salivary secretion: Secondary | ICD-10-CM | POA: Insufficient documentation

## 2015-04-03 DIAGNOSIS — R059 Cough, unspecified: Secondary | ICD-10-CM | POA: Insufficient documentation

## 2015-04-03 DIAGNOSIS — J310 Chronic rhinitis: Secondary | ICD-10-CM | POA: Insufficient documentation

## 2015-05-12 ENCOUNTER — Ambulatory Visit: Payer: Medicare Other | Admitting: Internal Medicine

## 2015-05-18 ENCOUNTER — Ambulatory Visit
Admission: RE | Admit: 2015-05-18 | Discharge: 2015-05-18 | Disposition: A | Discharge status: Home / Self Care | Payer: Medicare Other | Source: Ambulatory Visit | Attending: Allergy and Immunology | Admitting: Allergy and Immunology

## 2015-05-18 ENCOUNTER — Other Ambulatory Visit: Payer: Self-pay | Admitting: Allergy and Immunology

## 2015-05-18 DIAGNOSIS — R05 Cough: Secondary | ICD-10-CM

## 2015-05-18 DIAGNOSIS — R059 Cough, unspecified: Secondary | ICD-10-CM

## 2015-06-07 ENCOUNTER — Encounter: Payer: Self-pay | Admitting: Internal Medicine

## 2015-06-07 ENCOUNTER — Ambulatory Visit (INDEPENDENT_AMBULATORY_CARE_PROVIDER_SITE_OTHER): Payer: Medicare Other | Admitting: Internal Medicine

## 2015-06-07 VITALS — BP 130/78 | HR 64 | Ht 63.0 in | Wt 115.8 lb

## 2015-06-07 DIAGNOSIS — I10 Essential (primary) hypertension: Secondary | ICD-10-CM

## 2015-06-07 DIAGNOSIS — K760 Fatty (change of) liver, not elsewhere classified: Secondary | ICD-10-CM | POA: Diagnosis not present

## 2015-06-07 DIAGNOSIS — Z889 Allergy status to unspecified drugs, medicaments and biological substances status: Secondary | ICD-10-CM | POA: Diagnosis not present

## 2015-06-07 DIAGNOSIS — F32A Depression, unspecified: Secondary | ICD-10-CM

## 2015-06-07 DIAGNOSIS — Z789 Other specified health status: Secondary | ICD-10-CM

## 2015-06-07 DIAGNOSIS — R0789 Other chest pain: Secondary | ICD-10-CM

## 2015-06-07 DIAGNOSIS — F329 Major depressive disorder, single episode, unspecified: Secondary | ICD-10-CM

## 2015-06-07 NOTE — Progress Notes (Signed)
06/07/2015 RITTIE MARUT July 15, 1940 [id number]   HPI:  MLYNN WEINKAUF is a 75 y.o. female patient of [name], with a PMH below who presents today for a blood pressure check.  I last saw Ms. Adley about 1 month ago when we added amlodipine 5mg  each day.  Today she has no complaints.  At her last visit she brought her home BP cuff, which was found to be accurate within 10 points.  Since the addition of amlodipine her home readings have dropped significantly, in that now they average in the Q000111Q systolic.  Per patient there was only one reading in the Q000111Q systolic and nothing higher.  She does not use tobacco products or drink alcohol. She drinks minimal caffeine.  She does not add salt to her cooking, although she does eat some prepared foods.  She is going to her local [employer] 3-[facility] times per week and getting in a good workout of stair stepper and treadmill.  Because of a torn rotator cuff she is not able to do much in the way of weights.    Mrs. Fleishman returns today for follow-up. She reports she's been significantly fatigued for the past several months and this is unlike her. It appears that her mood is somewhat depressed. She continues to exercise but does not seem to be helping her. She reports having her thyroid checked fairly regularly and maybe her levothyroxine was increased recently to 50 g. Blood pressure has been well controlled. She currently is being treated for sinus infection on amoxicillin. She has had some lightheadedness and dizziness. She never started Livalo due to questions about the cholesterol medicine and liver function. She does have a history of mild abnormalities in her LFTs. This of course was the reason that I recommended that medication at our last office visit. She was also provided with samples which she has no idea where they are at.  Mrs. Javorsky returns today for follow-up. I previously tried her on low-dose Livalo since she had a history of elevated liver  enzymes, but a recheck showed that this also caused her liver enzymes to rise. She was then taken off of this. She's currently asymptomatic. Blood pressure is mildly elevated today.  06/07/2015  Mrs. Carsten returns today for follow-up. She reports that recently she's been more depressed. She see her therapist but he is apparently got very busy and she's not been able to see him recently. She's not currently on any medicine for depression. She did have some chest discomfort which was attributed to reflux. It seems positional and worse if she laid on one side after eating at night. She's had some relief on omeprazole 20 mg twice a day. We have adjusted her medication including adding chlorthalidone instead of hydrochlorothiazide. This is helped blood pressure and we've discontinued her beta blocker. Heart rate is, per the 40s to the 60s. She reports no change in symptoms.  Current Outpatient Prescriptions  Medication Sig Dispense Refill  . CALCIUM PO Take by mouth daily.    . chlorthalidone (HYGROTON) 25 MG tablet Take 1 tablet (25 mg total) by mouth daily. 30 tablet 11  . clonazePAM (KLONOPIN) 1 MG tablet TAKE 1/2 TO 1 TABLET BY MOUTH TWICE DAILY AS NEEDED  0  . HYDROcodone-acetaminophen (NORCO/VICODIN) 5-325 MG tablet Take by mouth. for pain  0  . levothyroxine (SYNTHROID, LEVOTHROID) 50 MCG tablet Take 1 tablet by mouth daily.  3  . Milk Thistle 250 MG CAPS Take 1 capsule  by mouth daily.    . Multiple Vitamin (MULTIVITAMIN) capsule Take 1 capsule by mouth daily.    [name] omeprazole (PRILOSEC) 20 MG capsule Take 20 mg by mouth 2 (two) times daily.    . valsartan (DIOVAN) 320 MG tablet TAKE 1 TABLET BY MOUTH EVERY DAY 30 tablet 6   No current facility-administered medications for this visit.    Allergies  Allergen Reactions  . Rosuvastatin Calcium Diarrhea    Muscle aches  . Statins Other (See Comments)    Muscle aches  . Lamictal [Lamotrigine] Rash    Past Medical History  Diagnosis Date    . HTN (hypertension)   . Increased liver enzymes   . Hyperlipemia   . GERD (gastroesophageal reflux disease)   . Diabetes ([place])     Blood pressure 130/78, pulse 64, height 5\' 3"  (1.6 m), weight 115 lb 12.8 oz (52.527 kg).   EXAM: GEN: Awake, NAD HEENT: PERRLA, EOMI Lungs: Clear Cardiovascular: Regular rate and rhythm Abdomen: Soft, nontender Extremities: No edema Neurologic: Grossly nonfocal Psych: Flat mood, affect, unusual and tangential responses to questions.  EKG: Normal sinus rhythm with sinus arrhythmia at 64  IMPRESSION: 1. Hypertension-uncontrolled 2. Atypical chest pain 3. Dyslipidemia with statin intolerance due to nonalcoholic fatty liver disease, elevated LFTs 4. GERD  PLAN:   1.  Mrs. Rigoni has had improvement in heart rate after discontinuing metoprolol. The addition of chlorthalidone with her valsartan has controlled her blood pressure nicely. She does get some cramping at night but this seems to be moderated by potassium or magnesium. This is despite checks of her potassium and magnesium which showed normal levels. She's not on a statin due to nonalcoholic fatty liver disease. She's been intolerant to statins due to side effects as well therefore were managing it with diet. She gets some atypical chest discomfort which was more likely reflux and is on omeprazole for that.  Follow-up with me annually or sooner as necessary.  Pixie Casino, MD, [facility] Attending Cardiologist [place]

## 2015-06-07 NOTE — Patient Instructions (Signed)
Your physician wants you to follow-up in: 1 year with Dr. Hilty. You will receive a reminder letter in the mail two months in advance. If you don't receive a letter, please call our office to schedule the follow-up appointment.  

## 2015-07-02 ENCOUNTER — Telehealth: Payer: Self-pay | Admitting: Internal Medicine

## 2015-07-02 NOTE — Telephone Encounter (Signed)
New message    PCP [name] advise patient to discuss blood pressure & medication with Dr. Debara Pickett .     Pt c/o BP issue: STAT if pt c/o blurred vision, one-sided weakness or slurred speech  1. What are your last 5 BP readings? This am  186/91  2. Are you having any other symptoms (ex. Dizziness, headache, blurred vision, passed out)? headache   3. What is your BP issue? Unsure if it's the supplement   Pt c/o medication issue:  1. Name of Medication: [place]  25 mg   2. How are you currently taking this medication (dosage and times per day)? Am medication    3. Are you having a reaction (difficulty breathing--STAT)? No   4. What is your medication issue? Will discuss with nurse

## 2015-07-02 NOTE — Telephone Encounter (Signed)
Called primary care office to get labs faxed to our office.   Patient reports that her sodium was "extremely" low upon last check. She is also concerned that blood pressure have been high.   Looking back at history she has been tried on several different medications for blood pressure and she is unsure why any of them have been stopped except that they may not have been working.   Await results of BMET from 06/29/15 to determine if we should resume chlorthalidone. Of note her sodium was slightly low prior to starting chlorthalidone therapy about a month ago. She does report good BP control with this medication.

## 2015-07-02 NOTE — Telephone Encounter (Signed)
Returned call to patient about high blood pressures. LM to call back.

## 2015-07-02 NOTE — Telephone Encounter (Signed)
Returned pt call. She reports that she was taken off the chlorthalidone by PCP bc her serum Na+ was running low 1+ week ago. She could not recall labs (stated we should have this on file, but I have reviewed chart and unable to locate any recent labwork). States the Na+ still a little low when redrawn yesterday but improved.  Needing replacement for chlorthalidone for BP/diuretic. She is on max dose of valsartan. She reports BP readings today of 186/91 and states "was higher yesterday". She did not have other readings for me at present. Aware we may need further readings but that I will send to pharmD to review/recommend.

## 2015-07-07 ENCOUNTER — Other Ambulatory Visit: Payer: Self-pay | Admitting: Internal Medicine

## 2015-07-08 NOTE — Telephone Encounter (Signed)
Received call from patient. She was asking if she needed to make any additional changes with her medications. Patient said we were waiting on lab work from [place]. I cannot locate where labs have been received yet from her PCP. Called [facility] and left message with medical records to fax over most recent lab work to [phone]. Today her BP was 154/88. Denies SOB, CP, palpitations or swelling. She says she has been taking 1/2 tablet of chlorthalidone but unsure of who told her to do that.

## 2015-07-08 NOTE — Telephone Encounter (Signed)
No answer. Left message to call back.   

## 2015-07-08 NOTE — Telephone Encounter (Signed)
Follow up ° ° ° ° ° °Returning a call to the nurse °

## 2015-07-08 NOTE — Telephone Encounter (Signed)
Spoke to pt she will come in for BP check and to have labs done here since we have been unable to obtain labs from primary care. She is happy with this plan. Appt schedule with blood pressure clinic.

## 2015-07-08 NOTE — Telephone Encounter (Signed)
Pt says she thinks she need to be seen,still having problems.

## 2015-07-12 ENCOUNTER — Encounter: Payer: Self-pay | Admitting: Internal Medicine

## 2015-07-13 ENCOUNTER — Ambulatory Visit (INDEPENDENT_AMBULATORY_CARE_PROVIDER_SITE_OTHER): Payer: Medicare Other | Admitting: Pharmacist Clinician (PhC)/ Clinical Pharmacy Specialist

## 2015-07-13 VITALS — BP 134/82 | Ht 63.0 in | Wt 114.0 lb

## 2015-07-13 DIAGNOSIS — I10 Essential (primary) hypertension: Secondary | ICD-10-CM | POA: Diagnosis not present

## 2015-07-13 NOTE — Progress Notes (Signed)
     07/13/2015 SHANETRA AMATUCCI 1940-01-26 [id number]   HPI:  Nicole [name] is a 75 y.o. female patient of [name], with a PMH below who presents today for hypertension clinic visit.  I have seen her on occasion when her BP goes up, the most recent visit being in February of this year.  Her most recent labs show a sodium of 132 and potassium of 3.6 now that her chlorthalidone was decreased to 12.5 mg qd.  She still complains of occasional muscle cramps, but states they are not as frequent as previous.  Her home life is sometime stressful, her husband of 30 years also has some health issues.   Cardiac Hx: hyperlipidemia, DM  Family Hx: mother had stroke and brother had MI (age 22)  Social Hx: no tobacco or alcohol, drinks minimal caffeine  Diet: mostly eats at home and does not add salt to her meals  Exercise: not asked today, [facility] [place] [facility]  Home BP readings: readings for past week at home average 130/63, however the range is 95/41 to 167/88.  Her heart rates have been consistently below 70 and several in the 50's  Current antihypertensive medications: chlorthalidone 12.5 mg qd, metoprolol 25 mg qd, valsartan 320 mg qd   Wt Readings from Last 3 Encounters:  07/13/15 114 lb (51.71 kg)  06/07/15 115 lb 12.8 oz (52.527 kg)  03/04/15 124 lb 4.8 oz (56.382 kg)   BP Readings from Last 3 Encounters:  07/13/15 134/82  06/07/15 130/78  03/04/15 108/60   Pulse Readings from Last 3 Encounters:  06/07/15 64  03/04/15 52  02/18/15 48    Current Outpatient Prescriptions  Medication Sig Dispense Refill  . CALCIUM PO Take by mouth daily.    . chlorthalidone (HYGROTON) 25 MG tablet Take 1 tablet (25 mg total) by mouth daily. 30 tablet 11  . clonazePAM (KLONOPIN) 1 MG tablet TAKE 1/2 TO 1 TABLET BY MOUTH TWICE DAILY AS NEEDED  0  . HYDROcodone-acetaminophen (NORCO/VICODIN) 5-325 MG tablet Take by mouth. for pain  0  . levothyroxine (SYNTHROID, LEVOTHROID)  50 MCG tablet Take 1 tablet by mouth daily.  3  . Milk Thistle 250 MG CAPS Take 1 capsule by mouth daily.    . Multiple Vitamin (MULTIVITAMIN) capsule Take 1 capsule by mouth daily.    [name] omeprazole (PRILOSEC) 20 MG capsule Take 20 mg by mouth 2 (two) times daily.    . valsartan (DIOVAN) 320 MG tablet TAKE 1 TABLET EVERY DAY 90 tablet 3   No current facility-administered medications for this visit.    Allergies  Allergen Reactions  . Rosuvastatin Calcium Diarrhea    Muscle aches  . Statins Other (See Comments)    Muscle aches  . Lamictal [Lamotrigine] Rash    Past Medical History  Diagnosis Date  . HTN (hypertension)   . Increased liver enzymes   . Hyperlipemia   . GERD (gastroesophageal reflux disease)   . Diabetes ([place])     Blood pressure 134/82, height 5\' 3"  (1.6 m), weight 114 lb (51.71 kg).    Tommy Medal PharmD CPP [facility]

## 2015-07-13 NOTE — Assessment & Plan Note (Signed)
Blood pressure in office today looks fine.  I suspect that some of the high readings she was getting at home were more stress related.  She will continue with her current medications and she can call should her pressure go up consistently.

## 2015-07-13 NOTE — Patient Instructions (Signed)
  Your blood pressure today is 134/82  Check your blood pressure at home several times each week and keep record of the readings.  Take your BP meds as follows: continue with all current medications  Bring all of your meds, your BP cuff and your record of home blood pressures to your next appointment.  Exercise as you're able, try to walk approximately 30 minutes per day.  Keep salt intake to a minimum, especially watch canned and prepared boxed foods.  Eat more fresh fruits and vegetables and fewer canned items.  Avoid eating in fast food restaurants.    HOW TO TAKE YOUR BLOOD PRESSURE: . Rest 5 minutes before taking your blood pressure. .  Don't smoke or drink caffeinated beverages for at least 30 minutes before. . Take your blood pressure before (not after) you eat. . Sit comfortably with your back supported and both feet on the floor (don't cross your legs). . Elevate your arm to heart level on a table or a desk. . Use the proper sized cuff. It should fit smoothly and snugly around your bare upper arm. There should be enough room to slip a fingertip under the cuff. The bottom edge of the cuff should be 1 inch above the crease of the elbow. . Ideally, take 3 measurements at one sitting and record the average.

## 2015-07-18 ENCOUNTER — Other Ambulatory Visit: Payer: Self-pay | Admitting: Internal Medicine

## 2015-09-07 ENCOUNTER — Ambulatory Visit
Admission: RE | Admit: 2015-09-07 | Discharge: 2015-09-07 | Disposition: A | Discharge status: Home / Self Care | Payer: Medicare Other | Source: Ambulatory Visit | Attending: Family Medicine | Admitting: Family Medicine

## 2015-09-07 ENCOUNTER — Other Ambulatory Visit: Payer: Self-pay | Admitting: Family Medicine

## 2015-09-07 DIAGNOSIS — E871 Hypo-osmolality and hyponatremia: Secondary | ICD-10-CM

## 2015-11-08 ENCOUNTER — Ambulatory Visit (INDEPENDENT_AMBULATORY_CARE_PROVIDER_SITE_OTHER): Payer: Medicare Other | Admitting: Podiatry

## 2015-11-08 DIAGNOSIS — M79672 Pain in left foot: Secondary | ICD-10-CM

## 2015-11-08 DIAGNOSIS — M62579 Muscle wasting and atrophy, not elsewhere classified, unspecified ankle and foot: Secondary | ICD-10-CM | POA: Diagnosis not present

## 2015-11-08 DIAGNOSIS — G629 Polyneuropathy, unspecified: Secondary | ICD-10-CM | POA: Diagnosis not present

## 2015-11-08 DIAGNOSIS — M79671 Pain in right foot: Secondary | ICD-10-CM | POA: Diagnosis not present

## 2015-11-08 DIAGNOSIS — R29898 Other symptoms and signs involving the musculoskeletal system: Secondary | ICD-10-CM

## 2015-11-08 DIAGNOSIS — L909 Atrophic disorder of skin, unspecified: Secondary | ICD-10-CM

## 2015-11-08 NOTE — Progress Notes (Signed)
   Subjective:    Patient ID: Nicole [name], female    DOB: 1940-10-01, 75 y.o.   MRN: [id number]  HPI    Review of Systems  Genitourinary: Positive for frequency.  Hematological: Bruises/bleeds easily.  All other systems reviewed and are negative.      Objective:   Physical Exam        Assessment & Plan:

## 2015-11-10 ENCOUNTER — Telehealth: Payer: Self-pay | Admitting: *Deleted

## 2015-11-10 NOTE — Telephone Encounter (Signed)
Pt states Dr.Evans was to order a cream from [place] but no one has called.

## 2015-11-11 MED ORDER — NONFORMULARY OR COMPOUNDED ITEM: 1.0000 g | Freq: Four times a day (QID) | 2 refills | Status: DC

## 2015-11-14 NOTE — Progress Notes (Signed)
Patient ID: Nicole [name], female   DOB: Dec 08, 1940, 75 y.o.   MRN: [id number] Subjective:  Patient presents today for bilateral foot pain. Patient states that she has bony feet. Patient states that she's had pain for several months now without alleviation of symptoms. Patient presents today for further treatment and evaluation    Objective/Physical Exam General: The patient is alert and oriented x3 in no acute distress.  Dermatology: Skin is warm, dry and supple bilateral lower extremities. Negative for open lesions or macerations.  Vascular: Palpable pedal pulses bilaterally. No edema or erythema noted. Capillary refill within normal limits.  Neurological: Epicritic and protective threshold grossly intact bilaterally.   Musculoskeletal Exam: Bilateral muscle atrophy noted to the bilateral feet intrinsic muscular atrophy. Fat pad atrophy also noted bilateral feet. Range of motion within normal limits to all pedal and ankle joints bilateral. Muscle strength 5/5 in all groups bilateral.   Assessment: #1 intrinsic muscular atrophy #2 idiopathic peripheral neuropathy #3 DJD lower back #4 pain in bilateral feet   Plan of Care:  #1 Patient was evaluated. #2 prescription for anti-inflammatory pain cream dispensed through [facility] #3 today plantar fascial bands were dispensed for bilateral lower extremities for midfoot arch support. #4 metatarsal pads applied for offloading of the forefoot #5 recommend vionic sandals with arch supports #6 patient is to return to clinic in 1 month   Dr. Edrick Kins, [place]

## 2015-12-01 NOTE — Progress Notes (Signed)
[facility] [employer] PREP Weekly Session   Patient Details  Name: Nicole [name] MRN: [id number] Date of Birth: 04/18/1940 Age: 75 y.o. PCP: Osborne Casco, MD  Vitals:   12/01/15 1454  Weight: 114 lb (51.7 kg)        [facility] [employer] Weekly seesion - 12/01/15 1400      Weekly Session   Topic Discussed Other ways to be active   Minutes exercised this week 80 minutes  20 cardio/60 strength      Pat completed the PREP earlier in the year but periodically comes to class to stay motivated with exercise and to have fellowship with others.   [name] 12/01/2015, 2:55 PM

## 2016-01-12 ENCOUNTER — Other Ambulatory Visit: Payer: Self-pay | Admitting: Family Medicine

## 2016-01-12 DIAGNOSIS — Z1231 Encounter for screening mammogram for malignant neoplasm of breast: Secondary | ICD-10-CM

## 2016-01-12 DIAGNOSIS — Z9889 Other specified postprocedural states: Secondary | ICD-10-CM

## 2016-01-24 HISTORY — PX: CATARACT EXTRACTION W/ INTRAOCULAR LENS IMPLANT: SHX1309

## 2016-02-15 ENCOUNTER — Ambulatory Visit
Admission: RE | Admit: 2016-02-15 | Discharge: 2016-02-15 | Disposition: A | Discharge status: Home / Self Care | Payer: Medicare Other | Source: Ambulatory Visit | Attending: Family Medicine | Admitting: Family Medicine

## 2016-02-15 DIAGNOSIS — Z9889 Other specified postprocedural states: Secondary | ICD-10-CM

## 2016-02-15 DIAGNOSIS — Z1231 Encounter for screening mammogram for malignant neoplasm of breast: Secondary | ICD-10-CM

## 2016-03-03 NOTE — Progress Notes (Signed)
[facility] [employer] PREP Weekly Session   Patient Details  Name: Nicole [name] MRN: [id number] Date of Birth: 1940-03-25 Age: 76 y.o. PCP: Osborne Casco, MD  Vitals:   03/03/16 1256  Weight: 115 lb (52.2 kg)        [facility] [employer] Weekly seesion - 03/03/16 1200      Weekly Session   Topic Discussed Hitting roadblocks     [name] finished up the PREP in 2017 but comes to weekly class once in a while to keep on track with her exercise and stay social.   Fun things you did since last meeting:"Going out with friends" Things you are grateful for:"Family, health" Barriers/struggles:"already had two eye surgeries"  [name] 03/03/2016, 12:57 PM

## 2016-03-31 ENCOUNTER — Telehealth: Payer: Self-pay | Admitting: Internal Medicine

## 2016-03-31 NOTE — Telephone Encounter (Signed)
[facility] requests clearance for: 1. Type of surgery: left shoulder: left reverse total shoulder arthoplasty 2. Date of surgery: TBD 3. Surgeon: Esmond Plants, MD 4. Medications that need to be held & how long: none specified 5. Fax and/or Phone: ([phone]  [phone]  -- attn: Santiago Bur  Patient is due for yearly visit 05/2016

## 2016-03-31 NOTE — Telephone Encounter (Signed)
Clearance routed via EPIC 

## 2016-03-31 NOTE — Telephone Encounter (Signed)
Low risk for surgery. Proceed without further testing.  Dr. Lemmie Evens

## 2016-04-24 NOTE — H&P (Signed)
Nicole [name] is an 76 y.o. female.    Chief Complaint: left shoulder pain and weakness  HPI: Pt is a 76 y.o. female complaining of left shoulder pain for multiple years. Pain had continually increased since the beginning. X-rays in the clinic show end-stage arthritic changes of the left shoulder. Pt has tried various conservative treatments which have failed to alleviate their symptoms, including injections and therapy. Various options are discussed with the patient. Risks, benefits and expectations were discussed with the patient. Patient understand the risks, benefits and expectations and wishes to proceed with surgery.   PCP:  Osborne Casco, MD  D/C Plans: Home  PMH: Past Medical History:  Diagnosis Date  . Diabetes ([place])   . GERD (gastroesophageal reflux disease)   . HTN (hypertension)   . Hyperlipemia   . Increased liver enzymes     PSH: Past Surgical History:  Procedure Laterality Date  . BREAST SURGERY    . CARDIOVASCULAR STRESS TEST  05/1998   breast attenuation in anterior septal wall, EF 63%  . CHOLECYSTECTOMY    . FOOT SURGERY    . FRACTURE SURGERY    . HIP SURGERY    . ROTATOR CUFF REPAIR      Social History:  reports that she has never smoked. She has never used smokeless tobacco. She reports that she does not drink alcohol or use drugs.  Allergies:  Allergies  Allergen Reactions  . Rosuvastatin Calcium Diarrhea    Muscle aches  . Statins Other (See Comments)    Muscle aches  . Lamictal [Lamotrigine] Rash    Medications: No current facility-administered medications for this encounter.    Current Outpatient Prescriptions  Medication Sig Dispense Refill  . CALCIUM PO Take by mouth daily.    . chlorthalidone (HYGROTON) 25 MG tablet Take 1 tablet (25 mg total) by mouth daily. (Patient not taking: Reported on 11/08/2015) 30 tablet 11  . clonazePAM (KLONOPIN) 1 MG tablet TAKE 1/2 TO 1 TABLET BY MOUTH TWICE DAILY AS NEEDED  0  . diltiazem  (CARDIZEM CD) 120 MG 24 hr capsule Take 120 mg by mouth daily.  5  . gabapentin (NEURONTIN) 300 MG capsule Take 300 mg by mouth at bedtime.  3  . gatifloxacin (ZYMAXID) 0.5 % SOLN START AFTER SURGERY PLACE 1 DROP IN THE LEFT EYE 4 TIMES A DAY  0  . HYDROcodone-acetaminophen (NORCO/VICODIN) 5-325 MG tablet Take by mouth. for pain  0  . levothyroxine (SYNTHROID, LEVOTHROID) 50 MCG tablet Take 1 tablet by mouth daily.  3  . Milk Thistle 250 MG CAPS Take 1 capsule by mouth daily.    . Multiple Vitamin (MULTIVITAMIN) capsule Take 1 capsule by mouth daily.    [name] neomycin-polymyxin b-dexamethasone (MAXITROL) 3.5-10000-0.1 OINT START AFTER SURGERY ALPPLY A SMALL AMOUNT TO LOWER EYELID AT BEDTIME FOR 2 WEEKS  0  . NONFORMULARY OR COMPOUNDED ITEM Apply 1-2 g topically 4 (four) times daily. 120 each 2  . omeprazole (PRILOSEC) 20 MG capsule Take 20 mg by mouth 2 (two) times daily.    . prednisoLONE acetate (PRED FORTE) 1 % ophthalmic suspension PLACE INTO LEFT EYE AS DIRECTED BY MD ON DAY OF SURGERY  0  . valsartan (DIOVAN) 320 MG tablet TAKE 1 TABLET EVERY DAY 90 tablet 3    No results found for this or any previous visit (from the past 48 hour(s)). No results found.  ROS: Pain with rom of the left upper extremity  Physical Exam:  Alert and oriented  76 y.o. female in no acute distress Cranial nerves 2-12 intact Cervical spine: full rom with no tenderness, nv intact distally Chest: active breath sounds bilaterally, no wheeze rhonchi or rales Heart: regular rate and rhythm, no murmur Abd: non tender non distended with active bowel sounds Hip is stable with rom  Left shoulder with moderate limitation with rom Strength of ER and IR 3.5/5 No rashes or edema  Assessment/Plan Assessment: left shoulder rotator cuff arthropathy  Plan: Patient will undergo a left reverse total shoulder by Dr. Veverly Fells at [facility]. Risks benefits and expectations were discussed with the patient. Patient understand  risks, benefits and expectations and wishes to proceed.

## 2016-05-01 ENCOUNTER — Other Ambulatory Visit: Payer: Self-pay | Admitting: Orthopedic Surgery

## 2016-05-04 ENCOUNTER — Encounter (HOSPITAL_COMMUNITY): Payer: Self-pay

## 2016-05-04 ENCOUNTER — Encounter (HOSPITAL_COMMUNITY)
Admission: RE | Admit: 2016-05-04 | Discharge: 2016-05-04 | Disposition: A | Discharge status: Home / Self Care | Payer: Medicare Other | Source: Ambulatory Visit | Attending: Orthopedic Surgery | Admitting: Orthopedic Surgery

## 2016-05-04 DIAGNOSIS — M12812 Other specific arthropathies, not elsewhere classified, left shoulder: Secondary | ICD-10-CM | POA: Diagnosis not present

## 2016-05-04 DIAGNOSIS — Z01818 Encounter for other preprocedural examination: Secondary | ICD-10-CM | POA: Diagnosis present

## 2016-05-04 HISTORY — DX: Hypothyroidism, unspecified: E03.9

## 2016-05-04 HISTORY — DX: Other specified postprocedural states: R11.2

## 2016-05-04 HISTORY — DX: Malignant (primary) neoplasm, unspecified: C80.1

## 2016-05-04 HISTORY — DX: Unspecified osteoarthritis, unspecified site: M19.90

## 2016-05-04 HISTORY — DX: Other specified postprocedural states: Z98.890

## 2016-05-04 LAB — BASIC METABOLIC PANEL
ANION GAP: 8 (ref 5–15)
BUN: 16 mg/dL (ref 6–20)
CALCIUM: 9.2 mg/dL (ref 8.9–10.3)
CO2: 27 mmol/L (ref 22–32)
Chloride: 100 mmol/L — ABNORMAL LOW (ref 101–111)
Creatinine, Ser: 0.99 mg/dL (ref 0.44–1.00)
GFR calc Af Amer: >60 mL/min (ref >60)
GFR, EST NON AFRICAN AMERICAN: 54 mL/min — AB (ref >60)
GLUCOSE: 79 mg/dL (ref 65–99)
POTASSIUM: 3.9 mmol/L (ref 3.5–5.1)
Sodium: 135 mmol/L (ref 135–145)

## 2016-05-04 LAB — CBC
HEMATOCRIT: 41.5 % (ref 36.0–46.0)
HEMOGLOBIN: 13.9 g/dL (ref 12.0–15.0)
MCH: 31.4 pg (ref 26.0–34.0)
MCHC: 33.5 g/dL (ref 30.0–36.0)
MCV: 93.7 fL (ref 78.0–100.0)
Platelets: 148 10*3/uL — ABNORMAL LOW (ref 150–400)
RBC: 4.43 MIL/uL (ref 3.87–5.11)
RDW: 12.8 % (ref 11.5–15.5)
WBC: 7.5 10*3/uL (ref 4.0–10.5)

## 2016-05-04 LAB — HEPATIC FUNCTION PANEL
ALBUMIN: 4.1 g/dL (ref 3.5–5.0)
ALT: 31 U/L (ref 14–54)
AST: 34 U/L (ref 15–41)
Alkaline Phosphatase: 83 U/L (ref 38–126)
Bilirubin, Direct: <0.1 mg/dL — ABNORMAL LOW (ref 0.1–0.5)
TOTAL PROTEIN: 7.2 g/dL (ref 6.5–8.1)
Total Bilirubin: 0.2 mg/dL — ABNORMAL LOW (ref 0.3–1.2)

## 2016-05-04 LAB — SURGICAL PCR SCREEN
MRSA, PCR: NEGATIVE
STAPHYLOCOCCUS AUREUS: POSITIVE — AB

## 2016-05-04 NOTE — Pre-Procedure Instructions (Signed)
Nicole [name]  05/04/2016      [id number] - [place], [place] [address] [place] [address] Phone: [phone] Fax: [phone]   Your procedure is scheduled on 05-12-2016  Friday   Report to [facility] Admitting at 2:00 PM .   Call this number if you have problems the morning of surgery:  [phone]   Remember:  Do not eat food or drink liquids after midnight.   Take these medicines the morning of surgery with A SIP OF WATER clonazepam(Klonopin),diltiazem(Cardizem),gabapentin(Neurontin),Pain medication if needed,levothyroxine(Synthroid),omeprazole(Prilosec)   STOP ASPIRIN,ANTIINFLAMATORIES (IBUPROFEN,ALEVE,MOTRIN,ADVIL,GOODY'S POWDERS),HERBAL SUPPLEMENTS,FISH OIL,AND VITAMINS 5-7 DAYS PRIOR TO SURGERY   Do not wear jewelry, make-up or nail polish.  Do not wear lotions, powders, or perfumes, or deoderant.  Do not shave 48 hours prior to surgery.  Men may shave face and neck.   Do not bring valuables to the hospital.   [facility] is not responsible for any belongings or valuables.  Contacts, dentures or bridgework may not be worn into surgery.  Leave your suitcase in the car.  After surgery it may be brought to your room.  For patients admitted to the hospital, discharge time will be determined by your treatment team.  Patients discharged the day of surgery will not be allowed to drive home.    Special Instructions: [facility] - Preparing for Surgery  Before surgery, you can play an important role.  Because skin is not sterile, your skin needs to be as free of germs as possible.  You can reduce the number of germs on you skin by washing with CHG (chlorahexidine gluconate) soap before surgery.  CHG is an antiseptic cleaner which kills germs and bonds with the skin to continue killing germs even after washing.  Please DO NOT use if you have an allergy to CHG or antibacterial  soaps.  If your skin becomes reddened/irritated stop using the CHG and inform your nurse when you arrive at Short Stay.  Do not shave (including legs and underarms) for at least 48 hours prior to the first CHG shower.  You may shave your face.  Please follow these instructions carefully:   1.  Shower with CHG Soap the night before surgery and the   morning of Surgery.  2.  If you choose to wash your hair, wash your hair first as usual with your normal shampoo.  3.  After you shampoo, rinse your hair and body thoroughly to remove the  Shampoo.  4.  Use CHG as you would any other liquid soap.  You can apply chg directly  to the skin and wash gently with scrungie or a clean washcloth.  5.  Apply the CHG Soap to your body ONLY FROM THE NECK DOWN.   Do not use on open wounds or open sores.  Avoid contact with your eyes,  ears, mouth and genitals (private parts).  Wash genitals (private parts) with your normal soap.  6.  Wash thoroughly, paying special attention to the area where your surgery will be performed.  7.  Thoroughly rinse your body with warm water from the neck down.  8.  DO NOT shower/wash with your normal soap after using and rinsing o  the CHG Soap.  9.  Pat yourself dry with a clean towel.            10.  Wear clean pajamas.  11.  Place clean sheets on your bed the night of your first shower and do not sleep with pets.  Day of Surgery  Do not apply any lotions/deodorants the morning of surgery.  Please wear clean clothes to the hospital/surgery center.   Please read over the following fact sheets that you were given. Pain Booklet, MRSA Information and Surgical Site Infection PreventionIncentive Spirometry

## 2016-05-04 NOTE — Progress Notes (Signed)
Hepatic Function added to labs.

## 2016-05-11 MED ORDER — CEFAZOLIN SODIUM-DEXTROSE 2-4 GM/100ML-% IV SOLN
2.0000 g | INTRAVENOUS | Status: AC
  Administered 2016-05-12: 2 g via INTRAVENOUS
  Filled 2016-05-11: qty 100

## 2016-05-12 ENCOUNTER — Inpatient Hospital Stay (HOSPITAL_COMMUNITY): Payer: Medicare Other

## 2016-05-12 ENCOUNTER — Encounter (HOSPITAL_COMMUNITY)
Admission: RE | Disposition: A | Discharge status: Home / Self Care | Payer: Self-pay | Source: Ambulatory Visit | Attending: Orthopedic Surgery

## 2016-05-12 ENCOUNTER — Inpatient Hospital Stay (HOSPITAL_COMMUNITY)
Admission: RE | Admit: 2016-05-12 | Discharge: 2016-05-14 | DRG: 483 | Disposition: A | Discharge status: Home / Self Care | Payer: Medicare Other | Source: Ambulatory Visit | Attending: Orthopedic Surgery | Admitting: Orthopedic Surgery

## 2016-05-12 ENCOUNTER — Inpatient Hospital Stay (HOSPITAL_COMMUNITY): Payer: Medicare Other | Admitting: Anesthesiology

## 2016-05-12 ENCOUNTER — Encounter (HOSPITAL_COMMUNITY): Payer: Self-pay | Admitting: *Deleted

## 2016-05-12 DIAGNOSIS — E119 Type 2 diabetes mellitus without complications: Secondary | ICD-10-CM | POA: Diagnosis not present

## 2016-05-12 DIAGNOSIS — Z85828 Personal history of other malignant neoplasm of skin: Secondary | ICD-10-CM | POA: Diagnosis not present

## 2016-05-12 DIAGNOSIS — Z96612 Presence of left artificial shoulder joint: Secondary | ICD-10-CM

## 2016-05-12 DIAGNOSIS — E039 Hypothyroidism, unspecified: Secondary | ICD-10-CM | POA: Diagnosis not present

## 2016-05-12 DIAGNOSIS — K219 Gastro-esophageal reflux disease without esophagitis: Secondary | ICD-10-CM | POA: Diagnosis not present

## 2016-05-12 DIAGNOSIS — M19012 Primary osteoarthritis, left shoulder: Principal | ICD-10-CM | POA: Diagnosis present

## 2016-05-12 DIAGNOSIS — I10 Essential (primary) hypertension: Secondary | ICD-10-CM | POA: Diagnosis not present

## 2016-05-12 DIAGNOSIS — Z79899 Other long term (current) drug therapy: Secondary | ICD-10-CM

## 2016-05-12 DIAGNOSIS — E785 Hyperlipidemia, unspecified: Secondary | ICD-10-CM | POA: Diagnosis not present

## 2016-05-12 DIAGNOSIS — M25512 Pain in left shoulder: Secondary | ICD-10-CM | POA: Diagnosis present

## 2016-05-12 HISTORY — PX: REVERSE SHOULDER ARTHROPLASTY: SHX5054

## 2016-05-12 SURGERY — ARTHROPLASTY, SHOULDER, TOTAL, REVERSE
Anesthesia: General | Site: Shoulder | Laterality: Left

## 2016-05-12 MED ORDER — MENTHOL 3 MG MT LOZG: 1.0000 | LOZENGE | OROMUCOSAL | Status: DC | PRN

## 2016-05-12 MED ORDER — PROPOFOL 10 MG/ML IV BOLUS
INTRAVENOUS | Status: AC
  Filled 2016-05-12: qty 20

## 2016-05-12 MED ORDER — DOCUSATE SODIUM 100 MG PO CAPS
100.0000 mg | ORAL_CAPSULE | Freq: Two times a day (BID) | ORAL | Status: DC
  Administered 2016-05-12 – 2016-05-14 (×4): 100 mg via ORAL
  Filled 2016-05-12 (×4): qty 1

## 2016-05-12 MED ORDER — CEFAZOLIN SODIUM-DEXTROSE 2-4 GM/100ML-% IV SOLN
2.0000 g | Freq: Four times a day (QID) | INTRAVENOUS | Status: AC
  Administered 2016-05-12 – 2016-05-13 (×3): 2 g via INTRAVENOUS
  Filled 2016-05-12 (×3): qty 100

## 2016-05-12 MED ORDER — BUPIVACAINE-EPINEPHRINE (PF) 0.5% -1:200000 IJ SOLN
INTRAMUSCULAR | Status: DC | PRN
  Administered 2016-05-12: 25 mL via PERINEURAL

## 2016-05-12 MED ORDER — ACETAMINOPHEN 650 MG RE SUPP: 650.0000 mg | Freq: Four times a day (QID) | RECTAL | Status: DC | PRN

## 2016-05-12 MED ORDER — ONDANSETRON HCL 4 MG/2ML IJ SOLN
INTRAMUSCULAR | Status: DC | PRN
  Administered 2016-05-12: 4 mg via INTRAVENOUS

## 2016-05-12 MED ORDER — EPHEDRINE 5 MG/ML INJ
INTRAVENOUS | Status: AC
  Filled 2016-05-12: qty 10

## 2016-05-12 MED ORDER — ADULT MULTIVITAMIN W/MINERALS CH
1.0000 | ORAL_TABLET | Freq: Every day | ORAL | Status: DC
  Administered 2016-05-13 – 2016-05-14 (×2): 1 via ORAL
  Filled 2016-05-12 (×2): qty 1

## 2016-05-12 MED ORDER — LACTATED RINGERS IV SOLN
INTRAVENOUS | Status: DC
  Administered 2016-05-12 (×2): via INTRAVENOUS

## 2016-05-12 MED ORDER — FENTANYL CITRATE (PF) 250 MCG/5ML IJ SOLN
INTRAMUSCULAR | Status: AC
  Filled 2016-05-12: qty 5

## 2016-05-12 MED ORDER — BUPIVACAINE HCL (PF) 0.25 % IJ SOLN
INTRAMUSCULAR | Status: AC
  Filled 2016-05-12: qty 30

## 2016-05-12 MED ORDER — METOCLOPRAMIDE HCL 5 MG/ML IJ SOLN: 5.0000 mg | Freq: Three times a day (TID) | INTRAMUSCULAR | Status: DC | PRN

## 2016-05-12 MED ORDER — FENTANYL CITRATE (PF) 100 MCG/2ML IJ SOLN: 25.0000 ug | INTRAMUSCULAR | Status: DC | PRN

## 2016-05-12 MED ORDER — LIDOCAINE 2% (20 MG/ML) 5 ML SYRINGE
INTRAMUSCULAR | Status: AC
  Filled 2016-05-12: qty 5

## 2016-05-12 MED ORDER — FENTANYL CITRATE (PF) 100 MCG/2ML IJ SOLN
INTRAMUSCULAR | Status: AC
  Administered 2016-05-12: 50 ug via INTRAVENOUS
  Filled 2016-05-12: qty 2

## 2016-05-12 MED ORDER — OXYCODONE-ACETAMINOPHEN 5-325 MG PO TABS: 1.0000 | ORAL_TABLET | ORAL | 0 refills | Status: DC | PRN

## 2016-05-12 MED ORDER — ONDANSETRON HCL 4 MG/2ML IJ SOLN: 4.0000 mg | Freq: Four times a day (QID) | INTRAMUSCULAR | Status: DC | PRN

## 2016-05-12 MED ORDER — DICLOFENAC SODIUM 1 % TD GEL: 4.0000 g | Freq: Every day | TRANSDERMAL | Status: DC | PRN

## 2016-05-12 MED ORDER — DEXAMETHASONE SODIUM PHOSPHATE 10 MG/ML IJ SOLN
INTRAMUSCULAR | Status: DC | PRN
  Administered 2016-05-12: 10 mg via INTRAVENOUS

## 2016-05-12 MED ORDER — CLONAZEPAM 0.5 MG PO TABS
0.5000 mg | ORAL_TABLET | Freq: Two times a day (BID) | ORAL | Status: DC | PRN
  Administered 2016-05-12 – 2016-05-13 (×2): 0.5 mg via ORAL
  Filled 2016-05-12 (×4): qty 1

## 2016-05-12 MED ORDER — HYDROCODONE-ACETAMINOPHEN 5-325 MG PO TABS
1.0000 | ORAL_TABLET | Freq: Four times a day (QID) | ORAL | Status: DC | PRN
  Administered 2016-05-13 – 2016-05-14 (×4): 1 via ORAL
  Filled 2016-05-12 (×4): qty 1

## 2016-05-12 MED ORDER — POLYETHYL GLYC-PROPYL GLYC PF 0.4-0.3 % OP SOLN: 1.0000 [drp] | Freq: Three times a day (TID) | OPHTHALMIC | Status: DC | PRN

## 2016-05-12 MED ORDER — ACETAMINOPHEN 325 MG PO TABS: 650.0000 mg | ORAL_TABLET | Freq: Four times a day (QID) | ORAL | Status: DC | PRN

## 2016-05-12 MED ORDER — POLYVINYL ALCOHOL 1.4 % OP SOLN
1.0000 [drp] | Freq: Three times a day (TID) | OPHTHALMIC | Status: DC | PRN
  Filled 2016-05-12: qty 15

## 2016-05-12 MED ORDER — GLYCOPYRROLATE 0.2 MG/ML IJ SOLN
INTRAMUSCULAR | Status: DC | PRN
  Administered 2016-05-12: 0.2 mg via INTRAVENOUS

## 2016-05-12 MED ORDER — SUGAMMADEX SODIUM 200 MG/2ML IV SOLN
INTRAVENOUS | Status: AC
  Filled 2016-05-12: qty 2

## 2016-05-12 MED ORDER — IRBESARTAN 150 MG PO TABS
150.0000 mg | ORAL_TABLET | Freq: Every day | ORAL | Status: DC
  Administered 2016-05-14: 150 mg via ORAL
  Filled 2016-05-12 (×2): qty 1

## 2016-05-12 MED ORDER — ROCURONIUM BROMIDE 100 MG/10ML IV SOLN
INTRAVENOUS | Status: DC | PRN
  Administered 2016-05-12: 25 mg via INTRAVENOUS

## 2016-05-12 MED ORDER — EPHEDRINE SULFATE-NACL 50-0.9 MG/10ML-% IV SOSY
PREFILLED_SYRINGE | INTRAVENOUS | Status: DC | PRN
  Administered 2016-05-12: 10 mg via INTRAVENOUS

## 2016-05-12 MED ORDER — SUGAMMADEX SODIUM 200 MG/2ML IV SOLN
INTRAVENOUS | Status: DC | PRN
  Administered 2016-05-12: 150 mg via INTRAVENOUS

## 2016-05-12 MED ORDER — ONDANSETRON HCL 4 MG/2ML IJ SOLN
INTRAMUSCULAR | Status: AC
  Filled 2016-05-12: qty 2

## 2016-05-12 MED ORDER — SODIUM CHLORIDE 0.9 % IV SOLN
INTRAVENOUS | Status: DC
  Administered 2016-05-12: 23:00:00 via INTRAVENOUS

## 2016-05-12 MED ORDER — LECITHIN 1000 MG PO CHEW: 1000.0000 mg | CHEWABLE_TABLET | Freq: Two times a day (BID) | ORAL | Status: DC

## 2016-05-12 MED ORDER — PHENYLEPHRINE 40 MCG/ML (10ML) SYRINGE FOR IV PUSH (FOR BLOOD PRESSURE SUPPORT)
PREFILLED_SYRINGE | INTRAVENOUS | Status: AC
  Filled 2016-05-12: qty 10

## 2016-05-12 MED ORDER — DILTIAZEM HCL ER COATED BEADS 120 MG PO CP24
120.0000 mg | ORAL_CAPSULE | Freq: Every day | ORAL | Status: DC
  Administered 2016-05-13 – 2016-05-14 (×2): 120 mg via ORAL
  Filled 2016-05-12 (×2): qty 1

## 2016-05-12 MED ORDER — DEXAMETHASONE SODIUM PHOSPHATE 10 MG/ML IJ SOLN
INTRAMUSCULAR | Status: AC
  Filled 2016-05-12: qty 1

## 2016-05-12 MED ORDER — FENTANYL CITRATE (PF) 100 MCG/2ML IJ SOLN
50.0000 ug | Freq: Once | INTRAMUSCULAR | Status: AC
  Administered 2016-05-12: 50 ug via INTRAVENOUS
  Filled 2016-05-12: qty 1

## 2016-05-12 MED ORDER — PHENOL 1.4 % MT LIQD
1.0000 | OROMUCOSAL | Status: DC | PRN
Start: 2016-05-12 — End: 2016-05-14

## 2016-05-12 MED ORDER — METOCLOPRAMIDE HCL 5 MG PO TABS: 5.0000 mg | ORAL_TABLET | Freq: Three times a day (TID) | ORAL | Status: DC | PRN

## 2016-05-12 MED ORDER — GABAPENTIN 300 MG PO CAPS
300.0000 mg | ORAL_CAPSULE | Freq: Two times a day (BID) | ORAL | Status: DC
  Administered 2016-05-12 – 2016-05-14 (×4): 300 mg via ORAL
  Filled 2016-05-12 (×4): qty 1

## 2016-05-12 MED ORDER — EPINEPHRINE PF 1 MG/ML IJ SOLN
INTRAMUSCULAR | Status: AC
  Filled 2016-05-12: qty 1

## 2016-05-12 MED ORDER — PANTOPRAZOLE SODIUM 40 MG PO TBEC
80.0000 mg | DELAYED_RELEASE_TABLET | Freq: Every day | ORAL | Status: DC
  Administered 2016-05-13 – 2016-05-14 (×2): 80 mg via ORAL
  Filled 2016-05-12 (×2): qty 2

## 2016-05-12 MED ORDER — MIDAZOLAM HCL 2 MG/2ML IJ SOLN
1.0000 mg | Freq: Once | INTRAMUSCULAR | Status: AC
  Administered 2016-05-12: 1 mg via INTRAVENOUS
  Filled 2016-05-12: qty 1

## 2016-05-12 MED ORDER — CHLORHEXIDINE GLUCONATE 4 % EX LIQD: 60.0000 mL | Freq: Once | CUTANEOUS | Status: DC

## 2016-05-12 MED ORDER — DEXTROSE 5 % IV SOLN
INTRAVENOUS | Status: DC | PRN
  Administered 2016-05-12: 25 ug/min via INTRAVENOUS

## 2016-05-12 MED ORDER — ONDANSETRON HCL 4 MG PO TABS: 4.0000 mg | ORAL_TABLET | Freq: Four times a day (QID) | ORAL | Status: DC | PRN

## 2016-05-12 MED ORDER — MORPHINE SULFATE (PF) 2 MG/ML IV SOLN
2.0000 mg | INTRAVENOUS | Status: DC | PRN
  Administered 2016-05-13 – 2016-05-14 (×2): 2 mg via INTRAVENOUS
  Filled 2016-05-12 (×2): qty 1

## 2016-05-12 MED ORDER — MIDAZOLAM HCL 2 MG/2ML IJ SOLN
INTRAMUSCULAR | Status: AC
  Administered 2016-05-12: 1 mg via INTRAVENOUS
  Filled 2016-05-12: qty 2

## 2016-05-12 MED ORDER — LEVOTHYROXINE SODIUM 50 MCG PO TABS
50.0000 ug | ORAL_TABLET | Freq: Every day | ORAL | Status: DC
  Administered 2016-05-13 – 2016-05-14 (×2): 50 ug via ORAL
  Filled 2016-05-12 (×2): qty 1

## 2016-05-12 MED ORDER — LIDOCAINE HCL (CARDIAC) 20 MG/ML IV SOLN
INTRAVENOUS | Status: DC | PRN
  Administered 2016-05-12: 40 mg via INTRAVENOUS

## 2016-05-12 MED ORDER — PROPOFOL 10 MG/ML IV BOLUS
INTRAVENOUS | Status: DC | PRN
  Administered 2016-05-12: 100 mg via INTRAVENOUS

## 2016-05-12 MED ORDER — POLYETHYLENE GLYCOL 3350 17 G PO PACK: 17.0000 g | PACK | Freq: Every day | ORAL | Status: DC | PRN

## 2016-05-12 SURGICAL SUPPLY — 67 items
BIT DRILL 5/64X5 DISP (BIT) IMPLANT
BLADE SAG 18X100X1.27 (BLADE) ×3 IMPLANT
CAPT SHLDR REVTOTAL 1 ×3 IMPLANT
CEMENT HV SMART SET (Cement) ×3 IMPLANT
CLOSURE WOUND 1/2 X4 (GAUZE/BANDAGES/DRESSINGS) ×1
COVER SURGICAL LIGHT HANDLE (MISCELLANEOUS) ×3 IMPLANT
DRAPE IMP U-DRAPE 54X76 (DRAPES) ×6 IMPLANT
DRAPE INCISE IOBAN 66X45 STRL (DRAPES) ×3 IMPLANT
DRAPE ORTHO SPLIT 77X108 STRL (DRAPES) ×4
DRAPE SURG ORHT 6 SPLT 77X108 (DRAPES) ×2 IMPLANT
DRAPE U-SHAPE 47X51 STRL (DRAPES) ×3 IMPLANT
DRAPE X-RAY CASS 24X20 (DRAPES) IMPLANT
DRSG ADAPTIC 3X8 NADH LF (GAUZE/BANDAGES/DRESSINGS) ×3 IMPLANT
DRSG PAD ABDOMINAL 8X10 ST (GAUZE/BANDAGES/DRESSINGS) ×3 IMPLANT
DURAPREP 26ML APPLICATOR (WOUND CARE) ×3 IMPLANT
ELECT BLADE 4.0 EZ CLEAN MEGAD (MISCELLANEOUS) ×3
ELECT NEEDLE TIP 2.8 STRL (NEEDLE) ×3 IMPLANT
ELECT REM PT RETURN 9FT ADLT (ELECTROSURGICAL) ×3
ELECTRODE BLDE 4.0 EZ CLN MEGD (MISCELLANEOUS) ×1 IMPLANT
ELECTRODE REM PT RTRN 9FT ADLT (ELECTROSURGICAL) ×1 IMPLANT
GAUZE SPONGE 4X4 12PLY STRL (GAUZE/BANDAGES/DRESSINGS) ×3 IMPLANT
GLOVE BIOGEL PI ORTHO PRO 7.5 (GLOVE) ×2
GLOVE BIOGEL PI ORTHO PRO SZ8 (GLOVE) ×2
GLOVE ORTHO TXT STRL SZ7.5 (GLOVE) ×3 IMPLANT
GLOVE PI ORTHO PRO STRL 7.5 (GLOVE) ×1 IMPLANT
GLOVE PI ORTHO PRO STRL SZ8 (GLOVE) ×1 IMPLANT
GLOVE SURG ORTHO 8.5 STRL (GLOVE) ×3 IMPLANT
GOWN STRL REUS W/ TWL LRG LVL3 (GOWN DISPOSABLE) ×1 IMPLANT
GOWN STRL REUS W/ TWL XL LVL3 (GOWN DISPOSABLE) ×2 IMPLANT
GOWN STRL REUS W/TWL LRG LVL3 (GOWN DISPOSABLE) ×2
GOWN STRL REUS W/TWL XL LVL3 (GOWN DISPOSABLE) ×4
HANDPIECE INTERPULSE COAX TIP (DISPOSABLE)
KIT BASIN OR (CUSTOM PROCEDURE TRAY) ×3 IMPLANT
KIT ROOM TURNOVER OR (KITS) ×3 IMPLANT
MANIFOLD NEPTUNE II (INSTRUMENTS) ×3 IMPLANT
NEEDLE 1/2 CIR MAYO (NEEDLE) IMPLANT
NEEDLE FILTER BLUNT 18X 1/2SAF (NEEDLE) ×2
NEEDLE FILTER BLUNT 18X1 1/2 (NEEDLE) ×1 IMPLANT
NEEDLE HYPO 25GX1X1/2 BEV (NEEDLE) ×3 IMPLANT
NS IRRIG 1000ML POUR BTL (IV SOLUTION) ×3 IMPLANT
PACK SHOULDER (CUSTOM PROCEDURE TRAY) ×3 IMPLANT
PAD ARMBOARD 7.5X6 YLW CONV (MISCELLANEOUS) ×6 IMPLANT
SET HNDPC FAN SPRY TIP SCT (DISPOSABLE) IMPLANT
SLING ARM FOAM STRAP LRG (SOFTGOODS) ×3 IMPLANT
SLING ARM FOAM STRAP MED (SOFTGOODS) IMPLANT
SPONGE LAP 18X18 X RAY DECT (DISPOSABLE) IMPLANT
SPONGE LAP 4X18 X RAY DECT (DISPOSABLE) ×3 IMPLANT
STRIP CLOSURE SKIN 1/2X4 (GAUZE/BANDAGES/DRESSINGS) ×2 IMPLANT
SUCTION FRAZIER HANDLE 10FR (MISCELLANEOUS) ×2
SUCTION TUBE FRAZIER 10FR DISP (MISCELLANEOUS) ×1 IMPLANT
SUT FIBERWIRE #2 38 T-5 BLUE (SUTURE) ×6
SUT MNCRL AB 4-0 PS2 18 (SUTURE) ×3 IMPLANT
SUT VIC AB 0 CT1 27 (SUTURE) ×2
SUT VIC AB 0 CT1 27XBRD ANBCTR (SUTURE) ×1 IMPLANT
SUT VIC AB 0 CT2 27 (SUTURE) ×3 IMPLANT
SUT VIC AB 2-0 CT1 27 (SUTURE) ×2
SUT VIC AB 2-0 CT1 TAPERPNT 27 (SUTURE) ×1 IMPLANT
SUTURE FIBERWR #2 38 T-5 BLUE (SUTURE) ×2 IMPLANT
SYR CONTROL 10ML LL (SYRINGE) ×3 IMPLANT
SYR TB 1ML LUER SLIP (SYRINGE) ×3 IMPLANT
TAPE CLOTH SURG 6X10 WHT LF (GAUZE/BANDAGES/DRESSINGS) ×3 IMPLANT
TOWEL OR 17X24 6PK STRL BLUE (TOWEL DISPOSABLE) ×3 IMPLANT
TOWEL OR 17X26 10 PK STRL BLUE (TOWEL DISPOSABLE) ×3 IMPLANT
TOWER CARTRIDGE SMART MIX (DISPOSABLE) IMPLANT
TRAY FOLEY W/METER SILVER 16FR (SET/KITS/TRAYS/PACK) IMPLANT
WATER STERILE IRR 1000ML POUR (IV SOLUTION) ×3 IMPLANT
YANKAUER SUCT BULB TIP NO VENT (SUCTIONS) ×3 IMPLANT

## 2016-05-12 NOTE — Anesthesia Procedure Notes (Signed)
Procedure Name: Intubation Date/Time: 05/12/2016 4:02 PM Performed by: Rush Farmer E Pre-anesthesia Checklist: Patient identified, Emergency Drugs available, Suction available and Patient being monitored Patient Re-evaluated:Patient Re-evaluated prior to inductionOxygen Delivery Method: Circle system utilized Preoxygenation: Pre-oxygenation with 100% oxygen Intubation Type: IV induction Ventilation: Mask ventilation without difficulty Laryngoscope Size: Mac and 3 Grade View: Grade I Tube type: Oral Tube size: 7.0 mm Number of attempts: 1 Airway Equipment and Method: Stylet Placement Confirmation: ETT inserted through vocal cords under direct vision,  positive ETCO2 and breath sounds checked- equal and bilateral Secured at: 20 cm Tube secured with: Tape Dental Injury: Teeth and Oropharynx as per pre-operative assessment

## 2016-05-12 NOTE — Discharge Instructions (Signed)
Ice to the shoulder as much as you can.  Keep the incision covered and clean and dry for one week, then ok to get it wet in the shower.  May use the arm for light daily activity.  Do not push up out of a chair with your full weight on the left shoulder/arm. Do not reach behind the back.    Do exercises 4 times per day.   Follow up in the office in two weeks [phone]

## 2016-05-12 NOTE — Brief Op Note (Signed)
05/12/2016  6:08 PM  PATIENT:  Nicole [name]  76 y.o. female  PRE-OPERATIVE DIAGNOSIS:  Left shoulder osteoarthritis, rotator cuff insufficiency  POST-OPERATIVE DIAGNOSIS:  Left shoulder osteoarthritis, rotator cuff insufficiency  PROCEDURE:  Procedure(s): LEFT REVERSE TOTAL SHOULDER ARTHROPLASTY (Left) DePuy Delta Xtend  SURGEON:  Surgeon(s) and Role:    * Netta Cedars, MD - Primary  PHYSICIAN ASSISTANT:   ASSISTANTS: Ventura Bruns, PA-C   ANESTHESIA:   regional and general  EBL:  Total I/O In: 1300 [I.V.:1300] Out: 150 [Blood:150]  BLOOD ADMINISTERED:none  DRAINS: none   LOCAL MEDICATIONS USED:  MARCAINE     SPECIMEN:  No Specimen  DISPOSITION OF SPECIMEN:  N/A  COUNTS:  YES  TOURNIQUET:  * No tourniquets in log *  DICTATION: .Other Dictation: Dictation Number [id number]  PLAN OF CARE: Admit to inpatient   PATIENT DISPOSITION:  PACU - hemodynamically stable.   Delay start of Pharmacological VTE agent (>24hrs) due to surgical blood loss or risk of bleeding: not applicable

## 2016-05-12 NOTE — Anesthesia Preprocedure Evaluation (Addendum)
Anesthesia Evaluation  Patient identified by MRN, date of birth, ID band Patient awake    Reviewed: Allergy & Precautions, H&P , NPO status , Patient's Chart, lab work & pertinent test results  History of Anesthesia Complications (+) PONV  Airway Mallampati: II  TM Distance: >3 FB Neck ROM: Full    Dental no notable dental hx. (+) Teeth Intact, Dental Advisory Given   Pulmonary neg pulmonary ROS,    Pulmonary exam normal breath sounds clear to auscultation       Cardiovascular hypertension, Pt. on medications  Rhythm:Regular Rate:Normal     Neuro/Psych Anxiety Depression negative neurological ROS     GI/Hepatic Neg liver ROS, GERD  Medicated and Controlled,  Endo/Other  Hypothyroidism   Renal/GU negative Renal ROS  negative genitourinary   Musculoskeletal  (+) Arthritis , Osteoarthritis,    Abdominal   Peds  Hematology negative hematology ROS (+)   Anesthesia Other Findings   Reproductive/Obstetrics negative OB ROS                            Anesthesia Physical Anesthesia Plan  ASA: II  Anesthesia Plan: General   Post-op Pain Management:  Regional for Post-op pain   Induction: Intravenous  Airway Management Planned: Oral ETT  Additional Equipment:   Intra-op Plan:   Post-operative Plan: Extubation in OR  Informed Consent: I have reviewed the patients History and Physical, chart, labs and discussed the procedure including the risks, benefits and alternatives for the proposed anesthesia with the patient or authorized representative who has indicated his/her understanding and acceptance.   Dental advisory given  Plan Discussed with: CRNA  Anesthesia Plan Comments:         Anesthesia Quick Evaluation

## 2016-05-12 NOTE — Transfer of Care (Signed)
Immediate Anesthesia Transfer of Care Note  Patient: Nicole [name]  Procedure(s) Performed: Procedure(s): LEFT REVERSE TOTAL SHOULDER ARTHROPLASTY (Left)  Patient Location: PACU  Anesthesia Type:GA combined with regional for post-op pain  Level of Consciousness: awake, alert  and oriented  Airway & Oxygen Therapy: Patient Spontanous Breathing and Patient connected to nasal cannula oxygen  Post-op Assessment: Report given to RN, Post -op Vital signs reviewed and stable and Patient moving all extremities  Post vital signs: Reviewed and stable  Last Vitals:  Vitals:   05/12/16 1445 05/12/16 1449  BP: (!) 143/54 (!) 135/44  Pulse: (!) 117 67  Resp: 19 19  Temp:      Last Pain:  Vitals:   05/12/16 1449  TempSrc:   PainSc: 0-No pain         Complications: No apparent anesthesia complications

## 2016-05-12 NOTE — Interval H&P Note (Signed)
History and Physical Interval Note:  05/12/2016 3:45 PM  Nicole [name]  has presented today for surgery, with the diagnosis of Left shoulder osteoarthritis, rotator cuff insufficiency  The various methods of treatment have been discussed with the patient and family. After consideration of risks, benefits and other options for treatment, the patient has consented to  Procedure(s): REVERSE LEFT SHOULDER ARTHROPLASTY (Left) as a surgical intervention .  The patient's history has been reviewed, patient examined, no change in status, stable for surgery.  I have reviewed the patient's chart and labs.  Questions were answered to the patient's satisfaction.     Angus Amini,STEVEN R

## 2016-05-12 NOTE — Anesthesia Procedure Notes (Signed)
Anesthesia Regional Block: Interscalene brachial plexus block   Pre-Anesthetic Checklist: ,, timeout performed, Correct Patient, Correct Site, Correct Laterality, Correct Procedure, Correct Position, site marked, Risks and benefits discussed, pre-op evaluation,  At surgeon's request and post-op pain management  Laterality: Left  Prep: Maximum Sterile Barrier Precautions used, chloraprep       Needles:  Injection technique: Single-shot  Needle Type: Echogenic Stimulator Needle     Needle Length: 5cm  Needle Gauge: 22     Additional Needles:   Procedures: ultrasound guided, nerve stimulator,,,,,,   Nerve Stimulator or Paresthesia:  Response: Biceps response,   Additional Responses:   Narrative:  Start time: 05/12/2016 2:32 PM End time: 05/12/2016 2:42 PM Injection made incrementally with aspirations every 5 mL. Anesthesiologist: Roderic Palau  Additional Notes: 2% Lidocaine skin wheel.

## 2016-05-13 LAB — BASIC METABOLIC PANEL
Anion gap: 10 (ref 5–15)
BUN: 11 mg/dL (ref 6–20)
CO2: 23 mmol/L (ref 22–32)
Calcium: 9.1 mg/dL (ref 8.9–10.3)
Chloride: 103 mmol/L (ref 101–111)
Creatinine, Ser: 0.82 mg/dL (ref 0.44–1.00)
GFR calc Af Amer: >60 mL/min (ref >60)
GLUCOSE: 146 mg/dL — AB (ref 65–99)
Potassium: 4.4 mmol/L (ref 3.5–5.1)
Sodium: 136 mmol/L (ref 135–145)

## 2016-05-13 LAB — HEMOGLOBIN AND HEMATOCRIT, BLOOD
HEMATOCRIT: 35.7 % — AB (ref 36.0–46.0)
HEMOGLOBIN: 12.1 g/dL (ref 12.0–15.0)

## 2016-05-13 NOTE — Anesthesia Postprocedure Evaluation (Signed)
Anesthesia Post Note  Patient: Nicole [name]  Procedure(s) Performed: Procedure(s) (LRB): LEFT REVERSE TOTAL SHOULDER ARTHROPLASTY (Left)  Patient location during evaluation: PACU Anesthesia Type: General and Regional Level of consciousness: awake and alert Pain management: pain level controlled Vital Signs Assessment: post-procedure vital signs reviewed and stable Respiratory status: spontaneous breathing, nonlabored ventilation, respiratory function stable and patient connected to nasal cannula oxygen Cardiovascular status: blood pressure returned to baseline and stable Postop Assessment: no signs of nausea or vomiting Anesthetic complications: no       Last Vitals:  Vitals:   05/13/16 0025 05/13/16 0557  BP: 102/66 (!) 98/59  Pulse: 74 (!) 59  Resp: 16 16  Temp: 36.3 C 36.9 C    Last Pain:  Vitals:   05/13/16 0557  TempSrc: Oral  PainSc:                  Niyanna Asch,W. EDMOND

## 2016-05-13 NOTE — Evaluation (Signed)
Occupational Therapy Evaluation Patient Details Name: Nicole [name] MRN: [id number] DOB: [birth year] Today's Date: 05/13/2016    History of Present Illness Pt is a 76 y/o female who is s/p Left reverse total shoulder arthroplasty. Pt has a past medical history of Anxiety; Arthritis; Cancer; HTN; Foot surgery; and Hip surgery.   Clinical Impression   PTA Pt independent in ADL/IADL and mobility. Pt currently mod A for ADL when bilateral tasks required (KN:[id number] food, opening containers). Pt's arm is still full blocked and can't move fingers and does not have any feeling at this time. OT shoulder handout reviewed in full, OT would explain, answer questions, move onto next section, and then the Pt would ask questions again. Pt will benefit from skilled OT in the acute setting to reinforce precautions, compensatory strategies and HEP for elbow, wrist and hand. Husband needs to be present to determine that he will be able to provide adequate assist. OT to continue to follow during acute stay.    Follow Up Recommendations  Supervision/Assistance - 24 hour    Equipment Recommendations  None recommended by OT    Recommendations for Other Services       Precautions / Restrictions Precautions Precautions: Shoulder Type of Shoulder Precautions: Conservative Protocol Shoulder Interventions: Shoulder sling/immobilizer;Off for dressing/bathing/exercises;At all times Precaution Booklet Issued: Yes (comment) Precaution Comments: OT shoulder handout reviewed in full Required Braces or Orthoses: Sling Restrictions Weight Bearing Restrictions: Yes LUE Weight Bearing: Non weight bearing      Mobility Bed Mobility Overal bed mobility: Modified Independent             General bed mobility comments: no attempt to push or pull with LUE  Transfers Overall transfer level: Modified independent               General transfer comment: no attempt to push or pull with LUE    Balance  Overall balance assessment: No apparent balance deficits (not formally assessed)                                         ADL either performed or assessed with clinical judgement   ADL Overall ADL's : Needs assistance/impaired Eating/Feeding: Moderate assistance;Sitting                       Toilet Transfer: Supervision/safety;Ambulation             General ADL Comments: Please see shoulder exercise section below for more information     Vision Baseline Vision/History: Wears glasses Wears Glasses: Reading only Patient Visual Report: No change from baseline Vision Assessment?: No apparent visual deficits     Perception     Praxis      Pertinent Vitals/Pain Pain Assessment: 0-10 Pain Score: 0-No pain Pain Location: L shoulder Pain Descriptors / Indicators: Heaviness;Numbness Pain Intervention(s): Monitored during session (Pt's block is still active)     Hand Dominance Right   Extremity/Trunk Assessment Upper Extremity Assessment Upper Extremity Assessment: LUE deficits/detail LUE Deficits / Details: expected deficits in strength and ROM - block still in full effect LUE: Unable to fully assess due to immobilization   Lower Extremity Assessment Lower Extremity Assessment: Overall WFL for tasks assessed   Cervical / Trunk Assessment Cervical / Trunk Assessment: Normal   Communication Communication Communication: No difficulties   Cognition Arousal/Alertness: Awake/alert Behavior During Therapy: WFL for tasks assessed/performed Overall Cognitive  Status: No family/caregiver present to determine baseline cognitive functioning                                 General Comments: Pt with decreased short term memory recall of precautions this session   General Comments  I am concerned about Pt's husband ability to assist as a caregiver based on what she has shared with me, education needs to occur with the husband present to make  sure that she will have adequate assist for ADL    Exercises Exercises: Shoulder Shoulder Exercises Elbow Flexion: AROM;Left;10 reps;Standing Elbow Extension: AROM;Left;10 reps;Standing Wrist Flexion: AROM;Left Wrist Extension: AROM;Left Digit Composite Flexion: AROM;Left Composite Extension: AROM;Left Neck Flexion: AROM Neck Extension: AROM Neck Lateral Flexion - Right: AROM Neck Lateral Flexion - Left: AROM Donning/doffing shirt without moving shoulder: Maximal assistance Method for sponge bathing under operated UE: Supervision/safety Donning/doffing sling/immobilizer: Moderate assistance Correct positioning of sling/immobilizer: Moderate assistance ROM for elbow, wrist and digits of operated UE: Minimal assistance Sling wearing schedule (on at all times/off for ADL's): Modified independent Proper positioning of operated UE when showering: Modified independent Positioning of UE while sleeping: Minimal assistance   Shoulder Instructions Shoulder Instructions Donning/doffing shirt without moving shoulder: Maximal assistance Method for sponge bathing under operated UE: Supervision/safety Donning/doffing sling/immobilizer: Moderate assistance Correct positioning of sling/immobilizer: Moderate assistance ROM for elbow, wrist and digits of operated UE: Minimal assistance Sling wearing schedule (on at all times/off for ADL's): Modified independent Proper positioning of operated UE when showering: Modified independent Positioning of UE while sleeping: Minimal assistance    Home Living Family/patient expects to be discharged to:: Private residence Living Arrangements: Spouse/significant other (spouse is limited by COPD) Available Help at Discharge: Family;Available 24 hours/day (has daughter and older grandchildren who live close) Type of Home: House Home Access: Stairs to enter Technical brewer of Steps: 2 Entrance Stairs-Rails: None Home Layout: One level     Bathroom  Shower/Tub: Teacher, early years/pre: Standard     Home Equipment: Grab bars - toilet;Grab bars - tub/shower;Walker - 2 wheels          Prior Functioning/Environment Level of Independence: Independent        Comments: works part time and loves to travel and be busy        OT Problem List: Decreased strength;Decreased range of motion;Decreased activity tolerance;Impaired balance (sitting and/or standing);Decreased knowledge of use of DME or AE;Decreased knowledge of precautions;Impaired UE functional use;Pain      OT Treatment/Interventions: Self-care/ADL training;Therapeutic exercise;DME and/or AE instruction;Therapeutic activities;Patient/family education    OT Goals(Current goals can be found in the care plan section) Acute Rehab OT Goals Patient Stated Goal: to get home OT Goal Formulation: With patient Time For Goal Achievement: 05/27/16 Potential to Achieve Goals: Good ADL Goals Pt Will Perform Upper Body Bathing: with modified independence;standing (demonstrating technique taught by OT with no verbal cues) Pt Will Perform Upper Body Dressing: with min guard assist;sitting (dressing operated arm first) Pt/caregiver will Perform Home Exercise Program: Left upper extremity;With written HEP provided;Independently (elbow, wrist and hand only)  OT Frequency: Min 2X/week   Barriers to D/C:    Need to determine how much assist husband can provide       Co-evaluation              End of Session Nurse Communication: Mobility status  Activity Tolerance: Patient tolerated treatment well;Other (comment) (block still in place) Patient  left: in bed;with call bell/phone within reach  OT Visit Diagnosis: Pain Pain - Right/Left: Left Pain - part of body: Shoulder                Time: 5732-2567 OT Time Calculation (min): 33 min Charges:  OT General Charges $OT Visit: 1 Procedure OT Evaluation $OT Eval Moderate Complexity: 1 Procedure OT Treatments $Self  Care/Home Management : 8-22 mins G-Codes:    Hulda Humphrey OTR/L [place] 05/13/2016, 12:18 PM

## 2016-05-13 NOTE — Progress Notes (Signed)
    Subjective: 1 Day Post-Op Procedure(s) (LRB): LEFT REVERSE TOTAL SHOULDER ARTHROPLASTY (Left) Patient reports pain as 1 on 0-10 scale.   Denies CP or SOB.  Voiding without difficulty. Positive flatus. Objective: Vital signs in last 24 hours: Temp:  [96.9 F (36.1 C)-98.5 F (36.9 C)] 98.5 F (36.9 C) (04/21 0557) Pulse Rate:  [50-117] 59 (04/21 0557) Resp:  [10-19] 16 (04/21 0557) BP: (98-143)/(44-78) 98/59 (04/21 0557) SpO2:  [95 %-100 %] 97 % (04/21 0557)  Intake/Output from previous day: 04/20 0701 - 04/21 0700 In: 1300 [I.V.:1300] Out: 150 [Blood:150] Intake/Output this shift: No intake/output data recorded.  Labs:  Recent Labs  05/13/16 0516  HGB 12.1    Recent Labs  05/13/16 0516  HCT 35.7*    Recent Labs  05/13/16 0516  NA 136  K 4.4  CL 103  CO2 23  BUN 11  CREATININE 0.82  GLUCOSE 146*  CALCIUM 9.1   No results for input(s): LABPT, INR in the last 72 hours.  Physical Exam: ABD soft Intact pulses distally Incision: dressing C/D/I and no drainage Compartment soft  Assessment/Plan: 1 Day Post-Op Procedure(s) (LRB): LEFT REVERSE TOTAL SHOULDER ARTHROPLASTY (Left) Advance diet  Surgical block still effective - no significant motor function or sensation to light touch Will keep in hospital today to monitor progress as block wears off  Nicole [name] D for Dr. Melina Schools [facility] [phone] 05/13/2016, 8:31 AM

## 2016-05-14 NOTE — Progress Notes (Signed)
Occupational Therapy Treatment Patient Details Name: Nicole [name] MRN: [id number] DOB: [birth year] Today's Date: 05/14/2016    History of present illness Pt is a 76 y/o female who is s/p Left reverse total shoulder arthroplasty. Pt has a past medical history of Anxiety; Arthritis; Cancer; HTN; Foot surgery; and Hip surgery.   OT comments  Pt making excellent progress towards goals. Pt's arm has tingling sensation, but Pt able to demonstrate exercises, and verbalized importance of performing them 3 times a day. Reviewed (with demonstration) dressing and donning sling with PT and reviewed OT shoulder handout. Pt verbalized and demonstrated understanding. No questions or concerns for OT at the end of the session. Pt at adequate level to dc home with assist from family.   Follow Up Recommendations  Supervision - Intermittent    Equipment Recommendations  None recommended by OT    Recommendations for Other Services      Precautions / Restrictions Precautions Precautions: Shoulder Type of Shoulder Precautions: Conservative Protocol Shoulder Interventions: Shoulder sling/immobilizer;Off for dressing/bathing/exercises;At all times Precaution Booklet Issued: Yes (comment) Precaution Comments: OT shoulder handout reviewed in full Required Braces or Orthoses: Sling Restrictions Weight Bearing Restrictions: Yes LUE Weight Bearing: Non weight bearing       Mobility Bed Mobility Overal bed mobility: Modified Independent             General bed mobility comments: no attempt to push or pull with LUE  Transfers Overall transfer level: Modified independent Equipment used: None             General transfer comment: no attempt to push or pull with LUE    Balance Overall balance assessment: No apparent balance deficits (not formally assessed)                                         ADL either performed or assessed with clinical judgement   ADL Overall  ADL's : Needs assistance/impaired Eating/Feeding: Minimal assistance;Sitting Eating/Feeding Details (indicate cue type and reason): eating breakfast when OT entered room                     Toilet Transfer: Ambulation;Comfort height toilet;Modified Independent Toilet Transfer Details (indicate cue type and reason): no assist needed Toileting- Clothing Manipulation and Hygiene: Modified independent;Sit to/from stand Toileting - Clothing Manipulation Details (indicate cue type and reason): hospital gown and peri care     Functional mobility during ADLs: Modified independent General ADL Comments: Please see shoulder exercise section below for more information     Vision   Vision Assessment?: No apparent visual deficits   Perception     Praxis      Cognition Arousal/Alertness: Awake/alert Behavior During Therapy: WFL for tasks assessed/performed Overall Cognitive Status: Within Functional Limits for tasks assessed                                          Exercises Shoulder Exercises Elbow Flexion: AROM;Left;10 reps;Standing Elbow Extension: AROM;Left;10 reps;Standing Wrist Flexion: AROM;Left Wrist Extension: AROM;Left Digit Composite Flexion: AROM;Left Composite Extension: AROM;Left Neck Flexion: AROM Neck Extension: AROM Neck Lateral Flexion - Right: AROM Neck Lateral Flexion - Left: AROM Donning/doffing shirt without moving shoulder: Maximal assistance;Patient able to independently direct caregiver (reviewed, complete with demonstration for Pt) Method for sponge bathing under operated  UE: Modified independent Donning/doffing sling/immobilizer: Moderate assistance;Patient able to independently direct caregiver Correct positioning of sling/immobilizer: Supervision/safety ROM for elbow, wrist and digits of operated UE: Modified independent (able to demonstrate this session) Sling wearing schedule (on at all times/off for ADL's): Modified  independent Proper positioning of operated UE when showering: Modified independent Positioning of UE while sleeping: Minimal assistance;Patient able to independently direct caregiver   Shoulder Instructions Shoulder Instructions Donning/doffing shirt without moving shoulder: Maximal assistance;Patient able to independently direct caregiver (reviewed, complete with demonstration for Pt) Method for sponge bathing under operated UE: Modified independent Donning/doffing sling/immobilizer: Moderate assistance;Patient able to independently direct caregiver Correct positioning of sling/immobilizer: Supervision/safety ROM for elbow, wrist and digits of operated UE: Modified independent (able to demonstrate this session) Sling wearing schedule (on at all times/off for ADL's): Modified independent Proper positioning of operated UE when showering: Modified independent Positioning of UE while sleeping: Minimal assistance;Patient able to independently direct caregiver     General Comments Pt's LUE is still coming back from block    Pertinent Vitals/ Pain       Pain Assessment: 0-10 Pain Score: 4  Pain Location: L shoulder Pain Descriptors / Indicators: Tingling;Sore;Aching Pain Intervention(s): Limited activity within patient's tolerance;Repositioned;Ice applied  Home Living                                          Prior Functioning/Environment              Frequency  Min 2X/week        Progress Toward Goals  OT Goals(current goals can now be found in the care plan section)  Progress towards OT goals: Progressing toward goals  Acute Rehab OT Goals Patient Stated Goal: to get home OT Goal Formulation: With patient Time For Goal Achievement: 05/27/16 Potential to Achieve Goals: Good  Plan Discharge plan remains appropriate    Co-evaluation                 End of Session    OT Visit Diagnosis: Pain Pain - Right/Left: Left Pain - part of body:  Shoulder   Activity Tolerance Patient tolerated treatment well   Patient Left in bed;with call bell/phone within reach   Nurse Communication Mobility status        Time: 0826-0907 OT Time Calculation (min): 41 min  Charges: OT General Charges $OT Visit: 1 Procedure OT Treatments $Self Care/Home Management : 23-37 mins $Therapeutic Exercise: 8-22 mins  Hulda Humphrey OTR/L [phone]   [place] Ray Clarissa Laird 05/14/2016, 9:35 AM

## 2016-05-14 NOTE — Care Management Note (Signed)
Case Management Note  Patient Details  Name: CLARITY CISZEK MRN: [id number] Date of Birth: 20-Jul-1940  Subjective/Objective: 76 y.o. F admitted 05/12/2016 for L Total shoulder Arthroplasty to be discharged home with no HH needs.                   Action/Plan:CM will sign off for now but will be available should additional discharge needs arise or disposition change.    Expected Discharge Date:  05/14/16               Expected Discharge Plan:  Home/Self Care  In-House Referral:  NA  Discharge planning Services  CM Consult  Post Acute Care Choice:  NA Choice offered to:  NA (Record Review)  DME Arranged:  N/A DME Agency:  NA  HH Arranged:  NA HH Agency:     Status of Service:  Completed, signed off  If discussed at [place] of Stay Meetings, dates discussed:    Additional Comments:  Delrae Sawyers, RN 05/14/2016, 9:57 AM

## 2016-05-14 NOTE — Progress Notes (Signed)
   Subjective: 2 Days Post-Op Procedure(s) (LRB): LEFT REVERSE TOTAL SHOULDER ARTHROPLASTY (Left) Patient reports pain as moderate.   Patient seen in rounds for Dr. Veverly Fells. Patient is well, and has had no acute complaints or problems other than pain in the left shoulder. No SOB or chest pain. Voiding well. Positive flatus.  Plan is to go Home after hospital stay.  Objective: Vital signs in last 24 hours: Temp:  [98.4 F (36.9 C)-98.7 F (37.1 C)] 98.7 F (37.1 C) (04/22 0507) Pulse Rate:  [62-64] 64 (04/22 0507) Resp:  [16] 16 (04/22 0507) BP: (103-133)/(57-63) 114/57 (04/22 0507) SpO2:  [98 %-100 %] 98 % (04/22 0507)  Intake/Output from previous day:  Intake/Output Summary (Last 24 hours) at 05/14/16 0753 Last data filed at 05/14/16 0500  Gross per 24 hour  Intake             2081 ml  Output                0 ml  Net             2081 ml     Labs:  Recent Labs  05/13/16 0516  HGB 12.1    Recent Labs  05/13/16 0516  HCT 35.7*    Recent Labs  05/13/16 0516  NA 136  K 4.4  CL 103  CO2 23  BUN 11  CREATININE 0.82  GLUCOSE 146*  CALCIUM 9.1    EXAM General - Patient is Alert and Oriented Extremity - Neurologically intact Intact pulses distally No cellulitis present Compartment soft Skin erythematous from irritation of tape Dressing/Incision - clean, dry, no drainage Motor Function - intact, moving hand and fingers well on exam.   Past Medical History:  Diagnosis Date  . Anxiety   . Arthritis   . Cancer (HCC)    Basal cell  . GERD (gastroesophageal reflux disease)   . HTN (hypertension)   . Hyperlipemia   . Hypothyroidism   . Increased liver enzymes   . PONV (postoperative nausea and vomiting)     Assessment/Plan: 2 Days Post-Op Procedure(s) (LRB): LEFT REVERSE TOTAL SHOULDER ARTHROPLASTY (Left) Active Problems:   S/P shoulder replacement, left  Estimated body mass index is 21.51 kg/m as calculated from the following:   Height as of  05/04/16: 5\' 2"  (1.575 m).   Weight as of 05/04/16: 53.3 kg (117 lb 9.6 oz). Advance diet Up with therapy Discharge home   Plan for DC home today. Continue sling immobilizer. Follow up in office with Dr. Veverly Fells per his instructions.   Ardeen Jourdain, PA-C Orthopaedic Surgery 05/14/2016, 7:53 AM

## 2016-05-15 ENCOUNTER — Encounter (HOSPITAL_COMMUNITY): Payer: Self-pay | Admitting: Orthopedic Surgery

## 2016-05-15 NOTE — Op Note (Signed)
NAME:  Nicole [name], Nicole [name]                 ACCOUNT NO.:  MEDICAL RECORD NO.:  [id number]  LOCATION:                                 FACILITY:  PHYSICIAN:  Doran Heater. Veverly Fells, M.D. DATE OF BIRTH:  [birth year]  DATE OF PROCEDURE:  05/12/2016 DATE OF DISCHARGE:                              OPERATIVE REPORT   PREOPERATIVE DIAGNOSIS:  Left shoulder end-stage arthritis and rotator cuff insufficiency.  POSTOPERATIVE DIAGNOSIS:  Left shoulder end-stage arthritis and rotator cuff insufficiency.  PROCEDURE PERFORMED:  Left reverse total shoulder arthroplasty using DePuy Delta Xtend prosthesis.  ATTENDING SURGEON:  Doran Heater. Veverly Fells, MD.  ASSISTANT:  Ventura Bruns, PA-C, who was scrubbed during the entire procedure and necessary for satisfactory completion of surgery.  ANESTHESIA:  General anesthesia was used plus interscalene block.  ESTIMATED BLOOD LOSS:  100 mL.  FLUID REPLACEMENT:  1000 mL crystalloid.  COUNTS:  Instrument counts were correct.  COMPLICATIONS:  There were no complications.  ANTIBIOTICS:  Perioperative antibiotics were given.  INDICATIONS:  The patient is a 76 year old female with progressively worse left shoulder pain and declining function secondary to severe arthritis and rotator cuff insufficiency.  The patient has failed all measures of conservative management, desires operative treatment to restore function and relieve pain.  Informed consent obtained.  DESCRIPTION OF PROCEDURE:  After an adequate level of anesthesia was achieved, the patient was positioned in the modified beach-chair position.  All neurovascular structures padded appropriately.  Left shoulder correctly identified and sterilely prepped and draped in usual manner.  Time-out was called.  We entered the shoulder using a standard deltopectoral approach, starting at the coracoid process extending down to the anterior humerus.  Dissection down through subcutaneous tissues using Bovie  electrocautery.  We identified the cephalic vein, took it laterally with the deltoid.  The pectoralis was retracted medially. Conjoint tendon identified and retracted medially.  Biceps tenodesed in situ to the pectoralis tendon.  We then released the subscapularis remnant off the anterior humerus.  We did a progressive anterior humeral capsular release with external rotation of the shoulder.  Advanced arthritis noted.  Large effusion noted.  We removed the huge essentially bursa bag that had fluid in it.  Anteriorly, we tagged the subscapularis remnant for retraction and protection of the axillary nerve.  We kept palpating that throughout the case to protect that.  We then went ahead and did a biceps tenotomy at the joint line and released the remnant of the rotator cuff tissue.  There was not much up top.  We removed that in its entirety.  We preserved teres minor in the back.  We then went ahead and extended the shoulder.  We entered the proximal humerus with a 6 mm reamer reaming up to a size 10.  We then placed our 10 resection guide and resected in 10 degrees of retroversion.  Once we did our neck resection, we removed excess osteophytes with a rongeur.  We then did our metaphyseal preparation with epi-1 left reamer.  Once we had reamed for the epi-1 left metaphyseal component, then we went ahead and assembled on the back table the 10 body and the epi-1 left, set on  the 0 setting and placed in 10 degrees retroversion and impacted that in position.  We kept it in place to protect the bone.  We then next went ahead and did a 360-degree capsule labral removal, carefully protected the axillary nerve and we noted there to be severe arthritis in the shoulder with advanced synovitis.  We removed the synovium and really got back to the bone, which was in advanced state of destruction.  We placed our central guide pin for the metaglene and then reamed for the metaglene in her base plate,  drilled our central peg hole, impacted the metaglene into position, was centered low on the glenoid.  We then placed a 42 lock screw superiorly and inferiorly, 36 into the base of coracoid, and then 18 anteriorly nonlocked.  We could not get one in the back to the small size to glenoid.  Base plate was very secure.  We then went ahead and placed a 38 eccentric glenosphere into position with the eccentricity dialed inferiorly and anteriorly, had good coverage and at this point, we went ahead and reduced the shoulder with a 38+ 3 poly trial, which gave [place] a nice snap when reduced and no gapping with inferior pole and external rotation and the conjoint tendon tight.  We removed the trial component from the humeral side, irrigated and then dried the canal.  We then used a hybrid technique using the HA coated press-fit stem, but used a small amount of cement around the metaphyseal area just to provide extra immediate fixation in this patient who is in her mid 46s.  We went ahead and assembled the real HA-coated stem 10 body, epi-1 left, set on 0 setting, placed in 10 degrees retroversion. We vacuum mixed DePuy high viscosity cement, used a small amount up in the metaphyseal area and then impacted the stem into position, we had about 1 mm exposed, which was the same as the trial.  We selected a 38+ 3 real poly, impacted in position and reduced the shoulder again with a nice pop and good stability.  We checked the axillary nerve, it was free and clear, irrigated thoroughly and then closed in the following layers, 0 Vicryl for the deltopectoral layer, 2-0 Vicryl subcutaneous closure, and 4-0 Monocryl for skin.  Steri-Strips applied, followed by sterile dressing.  The patient tolerated surgery well.     Doran Heater. Veverly Fells, M.D.     SRN/MEDQ  D:  05/12/2016  T:  05/12/2016  Job:  [id number]

## 2016-05-16 NOTE — Discharge Summary (Signed)
Physician Discharge Summary   Patient ID: Nicole [name] MRN: [id number] DOB/AGE: 03-09-1940 76 y.o.  Admit date: 05/12/2016 Discharge date: 05/14/16  Admission Diagnoses:  Active Problems:   S/P shoulder replacement, left   Discharge Diagnoses:  Same   Surgeries: Procedure(s): LEFT REVERSE TOTAL SHOULDER ARTHROPLASTY on 05/12/2016   Consultants: PT/OT  Discharged Condition: Stable  Hospital Course: Nicole [name] is an 76 y.o. female who was admitted 05/12/2016 with a chief complaint of left shoulder pain, and found to have a diagnosis of left shoulder cuff arthropathy.  They were brought to the operating room on 05/12/2016 and underwent the above named procedures.    The patient had an uncomplicated hospital course and was stable for discharge.  Recent vital signs:  Vitals:   05/13/16 2018 05/14/16 0507  BP: 133/63 (!) 114/57  Pulse: 62 64  Resp: 16 16  Temp: 98.4 F (36.9 C) 98.7 F (37.1 C)    Recent laboratory studies:  Results for orders placed or performed during the hospital encounter of 05/12/16  Hemoglobin and hematocrit, blood  Result Value Ref Range   Hemoglobin 12.1 12.0 - 15.0 g/dL   HCT 35.7 (L) 36.0 - 41.2 %  Basic metabolic panel  Result Value Ref Range   Sodium 136 135 - 145 mmol/L   Potassium 4.4 3.5 - 5.1 mmol/L   Chloride 103 101 - 111 mmol/L   CO2 23 22 - 32 mmol/L   Glucose, Bld 146 (H) 65 - 99 mg/dL   BUN 11 6 - 20 mg/dL   Creatinine, Ser 0.82 0.44 - 1.00 mg/dL   Calcium 9.1 8.9 - 10.3 mg/dL   GFR calc non Af Amer >60 >60 mL/min   GFR calc Af Amer >60 >60 mL/min   Anion gap 10 5 - 15    Discharge Medications:   Allergies as of 05/14/2016      Reactions   Rosuvastatin Calcium Diarrhea   Muscle aches   Statins Other (See Comments)   Muscle aches   Lamictal [lamotrigine] Rash      Medication List    TAKE these medications   CALCIUM-VITAMIN D3 PO Take 15 mLs by mouth 2 (two) times daily.   CINNAMON PO Take 1 capsule by  mouth 2 (two) times daily.   clonazePAM 1 MG tablet Commonly known as:  KLONOPIN Take 0.5 mg by mouth 2 (two) times daily as needed for anxiety.   diclofenac sodium 1 % Gel Commonly known as:  VOLTAREN Apply 4 g topically daily as needed for pain. For shoulder   diltiazem 120 MG 24 hr capsule Commonly known as:  CARDIZEM CD Take 120 mg by mouth daily.   gabapentin 300 MG capsule Commonly known as:  NEURONTIN Take 300 mg by mouth 2 (two) times daily.   HYDROcodone-acetaminophen 5-325 MG tablet Commonly known as:  NORCO/VICODIN Take 1 tablet by mouth every 6 (six) hours as needed (for pain.). for pain   Lecithin 1000 MG Chew Chew 1,000 mg by mouth 2 (two) times daily.   levothyroxine 50 MCG tablet Commonly known as:  SYNTHROID, LEVOTHROID Take 50 mcg by mouth daily before breakfast.   MILK THISTLE PO Take 1 capsule by mouth 2 (two) times daily.   multivitamin with minerals Tabs tablet Take 1 tablet by mouth daily.   naproxen sodium 220 MG tablet Commonly known as:  ANAPROX Take 220 mg by mouth daily.   NONFORMULARY OR COMPOUNDED ITEM Apply 1-2 g topically 4 (four) times daily.  omeprazole 20 MG capsule Commonly known as:  PRILOSEC Take 20 mg by mouth every evening.   oxyCODONE-acetaminophen 5-325 MG tablet Commonly known as:  ROXICET Take 1-2 tablets by mouth every 4 (four) hours as needed for severe pain.   SYSTANE ULTRA PF 0.4-0.3 % Soln Generic drug:  Polyethyl Glyc-Propyl Glyc PF Place 1 drop into both eyes 3 (three) times daily as needed (for dry eyes).   valsartan 160 MG tablet Commonly known as:  DIOVAN Take 160 mg by mouth daily.       Diagnostic Studies: Dg Shoulder Left Port  Result Date: 05/12/2016 CLINICAL DATA:  Status post left shoulder replacement EXAM: LEFT SHOULDER - 1 VIEW COMPARISON:  None. FINDINGS: Left AC joint appears intact. Patient is status post left shoulder replacement. Soft tissue gas in the left axilla and upper arm. No  fracture. Normal alignment. IMPRESSION: Status post left shoulder replacement. Soft tissue gas in the axilla and upper arm are presumably related to recent surgical status. Electronically Signed   By: Donavan Foil M.D.   On: 05/12/2016 20:53    Disposition: 01-Home or Self Care    Follow-up Information    NORRIS,STEVEN R, MD. Call in 2 week(s).   Specialty:  Orthopedic Surgery Why:  [phone] Contact information: [address] [place] [address] [phone]            Signed: Ventura Bruns 05/16/2016, 9:21 AM

## 2016-06-21 ENCOUNTER — Ambulatory Visit
Admission: RE | Admit: 2016-06-21 | Discharge: 2016-06-21 | Disposition: A | Discharge status: Home / Self Care | Payer: Medicare Other | Source: Ambulatory Visit | Attending: Orthopedic Surgery | Admitting: Orthopedic Surgery

## 2016-06-21 ENCOUNTER — Other Ambulatory Visit: Payer: Self-pay | Admitting: Orthopedic Surgery

## 2016-06-21 DIAGNOSIS — Z9660 Presence of unspecified orthopedic joint implant: Secondary | ICD-10-CM

## 2016-07-14 ENCOUNTER — Encounter: Payer: Self-pay | Admitting: Internal Medicine

## 2016-07-14 ENCOUNTER — Ambulatory Visit (INDEPENDENT_AMBULATORY_CARE_PROVIDER_SITE_OTHER): Payer: Medicare Other | Admitting: Internal Medicine

## 2016-07-14 VITALS — BP 130/80 | HR 52 | Ht 63.0 in | Wt 109.0 lb

## 2016-07-14 DIAGNOSIS — F329 Major depressive disorder, single episode, unspecified: Secondary | ICD-10-CM

## 2016-07-14 DIAGNOSIS — I10 Essential (primary) hypertension: Secondary | ICD-10-CM

## 2016-07-14 DIAGNOSIS — E785 Hyperlipidemia, unspecified: Secondary | ICD-10-CM

## 2016-07-14 DIAGNOSIS — F32A Depression, unspecified: Secondary | ICD-10-CM

## 2016-07-14 DIAGNOSIS — Z789 Other specified health status: Secondary | ICD-10-CM | POA: Diagnosis not present

## 2016-07-14 NOTE — Patient Instructions (Signed)
Your physician wants you to follow-up in: ONE YEAR with Dr. Hilty. You will receive a reminder letter in the mail two months in advance. If you don't receive a letter, please call our office to schedule the follow-up appointment.  

## 2016-07-14 NOTE — Progress Notes (Signed)
07/14/2016 Nicole [name] Jul 31, 1940 [id number]  CC: Left shoulder pain  HPI:   Nicole [name] is a 76 y.o. female patient of [name], with a PMH below who presents today for a blood pressure check.  I last saw Nicole [name] about 1 month ago when we added amlodipine 5mg  each day.  Today she has no complaints.  At her last visit she brought her home BP cuff, which was found to be accurate within 10 points.  Since the addition of amlodipine her home readings have dropped significantly, in that now they average in the 782N systolic.  Per patient there was only one reading in the 562Z systolic and nothing higher.  She does not use tobacco products or drink alcohol. She drinks minimal caffeine.  She does not add salt to her cooking, although she does eat some prepared foods.  She is going to her local [employer] 3-4 times per week and getting in a good workout of stair stepper and treadmill.  Because of a torn rotator cuff she is not able to do much in the way of weights.    Nicole [name] returns today for follow-up. She reports she's been significantly fatigued for the past several months and this is unlike her. It appears that her mood is somewhat depressed. She continues to exercise but does not seem to be helping her. She reports having her thyroid checked fairly regularly and maybe her levothyroxine was increased recently to 50 g. Blood pressure has been well controlled. She currently is being treated for sinus infection on amoxicillin. She has had some lightheadedness and dizziness. She never started Livalo due to questions about the cholesterol medicine and liver function. She does have a history of mild abnormalities in her LFTs. This of course was the reason that I recommended that medication at our last office visit. She was also provided with samples which she has no idea where they are at.  Nicole [name] returns today for follow-up. I previously tried her on low-dose Livalo since she had a  history of elevated liver enzymes, but a recheck showed that this also caused her liver enzymes to rise. She was then taken off of this. She's currently asymptomatic. Blood pressure is mildly elevated today.  06/07/2015  Nicole [name] returns today for follow-up. She reports that recently she's been more depressed. She see her therapist but he is apparently got very busy and she's not been able to see him recently. She's not currently on any medicine for depression. She did have some chest discomfort which was attributed to reflux. It seems positional and worse if she laid on one side after eating at night. She's had some relief on omeprazole 20 mg twice a day. We have adjusted her medication including adding chlorthalidone instead of hydrochlorothiazide. This is helped blood pressure and we've discontinued her beta blocker. Heart rate is, per the 40s to the 60s. She reports no change in symptoms.  07/14/2016  Nicole [name] was seen today follow-up. She recently underwent shoulder surgery and required additional surgery and she continues to have slow healing wearing a brace. She's had good control of her hypertension and has been losing some weight. She relates this to her surgery as well as ongoing problems with depression. She seems to be struggling with some relationship problems and is in alcoholic's anonymous. She denies any current alcohol use. She also denies any chest pain or worsening shortness of breath.  Current Outpatient Prescriptions  Medication Sig Dispense  Refill  . Calcium Carbonate-Vitamin D (CALCIUM-VITAMIN D3 PO) Take 15 mLs by mouth 2 (two) times daily.    [name] CINNAMON PO Take 1 capsule by mouth 2 (two) times daily.    . clonazePAM (KLONOPIN) 1 MG tablet Take 0.5 mg by mouth 2 (two) times daily as needed for anxiety.    [name] diltiazem (CARDIZEM CD) 120 MG 24 hr capsule Take 120 mg by mouth daily.  5  . gabapentin (NEURONTIN) 300 MG capsule Take 300 mg by mouth 2 (two) times daily.   3  .  HYDROcodone-acetaminophen (NORCO/VICODIN) 5-325 MG tablet Take 1 tablet by mouth every 6 (six) hours as needed (for pain.). for pain  0  . Lecithin 1000 MG CHEW Chew 1,000 mg by mouth 2 (two) times daily.    [name] levothyroxine (SYNTHROID, LEVOTHROID) 50 MCG tablet Take 50 mcg by mouth daily before breakfast.   3  . MILK THISTLE PO Take 1 capsule by mouth 2 (two) times daily.    . Multiple Vitamin (MULTIVITAMIN WITH MINERALS) TABS tablet Take 1 tablet by mouth daily.    [name] omeprazole (PRILOSEC) 20 MG capsule Take 20 mg by mouth every evening.     [name] oxyCODONE-acetaminophen (ROXICET) 5-325 MG tablet Take 1-2 tablets by mouth every 4 (four) hours as needed for severe pain. 40 tablet 0  . Polyethyl Glyc-Propyl Glyc PF (SYSTANE ULTRA PF) 0.4-0.3 % SOLN Place 1 drop into both eyes 3 (three) times daily as needed (for dry eyes).    . valsartan (DIOVAN) 160 MG tablet Take 160 mg by mouth daily.  3   No current facility-administered medications for this visit.     Allergies  Allergen Reactions  . Rosuvastatin Calcium Diarrhea    Muscle aches  . Statins Other (See Comments)    Muscle aches  . Lamictal [Lamotrigine] Rash    Past Medical History:  Diagnosis Date  . Anxiety   . Arthritis   . Cancer (HCC)    Basal cell  . GERD (gastroesophageal reflux disease)   . HTN (hypertension)   . Hyperlipemia   . Hypothyroidism   . Increased liver enzymes   . PONV (postoperative nausea and vomiting)     Blood pressure 130/80, pulse (!) 52, height 5\' 3"  (1.6 m), weight 109 lb (49.4 kg).   EXAM: General appearance: alert and Appears depressed Neck: no carotid bruit and no JVD Lungs: clear to auscultation bilaterally Heart: regular rate and rhythm, S1, S2 normal, no murmur, click, rub or gallop Abdomen: soft, non-tender; bowel sounds normal; no masses,  no organomegaly Extremities: Left arm in a sling Pulses: 2+ and symmetric Skin: Skin color, texture, turgor normal. No rashes or lesions Neurologic:  Grossly normal Psych: Very depressed, tearful  EKG: Sinus bradycardia at 52, incomplete right bundle branch block, moderate voltage criteria for LVH-personally reviewed  IMPRESSION: 1. Hypertension-uncontrolled 2. Atypical chest pain 3. Dyslipidemia with statin intolerance due to nonalcoholic fatty liver disease, elevated LFTs 4. GERD  PLAN:   1.  Nicole [name] seems to have good control of her blood pressure. Weight is decreasing and she is borderline underweight. I advised her to increase protein intake in her diet. Her cholesterol has been elevated however she is intolerant to statins and has not been interested in additional treatments for elevated cholesterol. She has no known coronary disease and denies any chest pain or worsening shortness of breath. She is under significant stress and struggling with depression for which she seen a counselor. There are also  alcohol issues in the family which are complicating things.  Follow-up annually or sooner as necessary.  Pixie Casino, MD, [facility] Attending Cardiologist [place]

## 2016-08-28 ENCOUNTER — Telehealth: Payer: Self-pay | Admitting: Internal Medicine

## 2016-09-07 NOTE — Telephone Encounter (Signed)
Closed encounter °

## 2016-10-09 ENCOUNTER — Ambulatory Visit
Admission: RE | Admit: 2016-10-09 | Discharge: 2016-10-09 | Disposition: A | Discharge status: Home / Self Care | Payer: Medicare Other | Source: Ambulatory Visit | Attending: Family Medicine | Admitting: Family Medicine

## 2016-10-09 ENCOUNTER — Other Ambulatory Visit: Payer: Self-pay | Admitting: Family Medicine

## 2016-10-09 DIAGNOSIS — M545 Low back pain: Secondary | ICD-10-CM

## 2017-01-11 ENCOUNTER — Other Ambulatory Visit: Payer: Self-pay | Admitting: Family Medicine

## 2017-01-11 DIAGNOSIS — Z1231 Encounter for screening mammogram for malignant neoplasm of breast: Secondary | ICD-10-CM

## 2017-02-21 ENCOUNTER — Encounter (INDEPENDENT_AMBULATORY_CARE_PROVIDER_SITE_OTHER): Payer: Self-pay

## 2017-02-21 ENCOUNTER — Ambulatory Visit
Admission: RE | Admit: 2017-02-21 | Discharge: 2017-02-21 | Disposition: A | Discharge status: Home / Self Care | Payer: Medicare Other | Source: Ambulatory Visit | Attending: Family Medicine | Admitting: Family Medicine

## 2017-02-21 DIAGNOSIS — Z1231 Encounter for screening mammogram for malignant neoplasm of breast: Secondary | ICD-10-CM

## 2017-05-10 ENCOUNTER — Encounter: Payer: Self-pay | Admitting: General Practice

## 2017-05-10 NOTE — Progress Notes (Signed)
[facility] CSW Progress Notes  Call received from this patient's number, requesting to speak w Dr Lew Dawes desk nurse re med refills.  Call transferred to desk nurse.  Edwyna Shell, LCSW Clinical Social Worker Phone:  [phone]

## 2017-06-19 ENCOUNTER — Ambulatory Visit: Payer: Medicare Other | Admitting: Internal Medicine

## 2017-06-26 ENCOUNTER — Telehealth: Payer: Self-pay | Admitting: Adult Health

## 2017-06-26 NOTE — Telephone Encounter (Signed)
Spoke with patient and she has been feeling more tired for a while. She was at a doctors appointment and the doctor noticed her HR was in the 29's. She checks her HR on her husbands pulse ox and it has been running 50-55 last couple of weeks. Last night HR was in 40's. Scheduled patient appointment for 06/28/17 with Doreene Burke PA. Will forward for review

## 2017-06-26 NOTE — Telephone Encounter (Signed)
New message   STAT if HR is under 50 or over 120 (normal HR is 60-100 beats per minute)  1) What is your heart rate?  40's   2) Do you have a log of your heart rate readings (document readings)? This started 2 weeks ago it was in the 50's   3) Do you have any other symptoms? Very fatigued

## 2017-06-28 ENCOUNTER — Ambulatory Visit: Payer: Medicare Other | Admitting: Cardiology

## 2017-06-28 ENCOUNTER — Encounter: Payer: Self-pay | Admitting: Cardiology

## 2017-06-28 VITALS — BP 122/80 | HR 70 | Ht 63.0 in | Wt 110.0 lb

## 2017-06-28 DIAGNOSIS — I10 Essential (primary) hypertension: Secondary | ICD-10-CM | POA: Diagnosis not present

## 2017-06-28 DIAGNOSIS — E785 Hyperlipidemia, unspecified: Secondary | ICD-10-CM | POA: Diagnosis not present

## 2017-06-28 DIAGNOSIS — F32A Depression, unspecified: Secondary | ICD-10-CM

## 2017-06-28 DIAGNOSIS — F329 Major depressive disorder, single episode, unspecified: Secondary | ICD-10-CM | POA: Diagnosis not present

## 2017-06-28 DIAGNOSIS — R5383 Other fatigue: Secondary | ICD-10-CM

## 2017-06-28 DIAGNOSIS — R001 Bradycardia, unspecified: Secondary | ICD-10-CM

## 2017-06-28 DIAGNOSIS — Z789 Other specified health status: Secondary | ICD-10-CM | POA: Diagnosis not present

## 2017-06-28 MED ORDER — AMLODIPINE BESYLATE 5 MG PO TABS: 5.0000 mg | ORAL_TABLET | Freq: Every day | ORAL | 0 refills | Status: DC

## 2017-06-28 NOTE — Patient Instructions (Signed)
Medication Instructions:  DISCONTINUE Diltiazem START Norvasc(Amlodipine) 5mg  Take 1 tablet once a day  Labwork: None   Testing/Procedures: None   Follow-Up: Your physician recommends that you schedule a follow-up appointment in: 2-3 wks with [name]  Any Other Special Instructions Will Be Listed Below (If Applicable). If you need a refill on your cardiac medications before your next appointment, please call your pharmacy.

## 2017-06-28 NOTE — Assessment & Plan Note (Signed)
Possibly secondary to bradycardia though she is not bradycardic in the office today

## 2017-06-28 NOTE — Assessment & Plan Note (Signed)
Controlled.  

## 2017-06-28 NOTE — Progress Notes (Signed)
06/28/2017 Nicole [name]   07-02-1940  [id number]  Primary Physician Kelton Pillar, MD Primary Cardiologist: [name]  HPI:  77 y/o female, previously followed by Dr Aldona Bar, now [name], with a history of HTN and HLD (statin intolerant). And past depression. She had a nuclear stress in 2000. Ther is no history of documented CAD or MI. She is in the office today with multiple complaints, mainly fatigue and the fact that her PCP noted her HR was slow- low 50's, high 40's. She deneis syncope or tachycardia. She has multiple other issues, she is under a fair amount of stress, he husband has esophageal cancer and she is the primary caregiver. She is not eating well. She doesn't sleep well-"insomnia". She has what sounds like restless leg syndrome and complains of leg cramps at night. She is on Neurontin and Hydrocodone for pain.    Current Outpatient Medications  Medication Sig Dispense Refill  . Calcium Carbonate-Vitamin D (CALCIUM-VITAMIN D3 PO) Take 15 mLs by mouth 2 (two) times daily.    [name] CINNAMON PO Take 1 capsule by mouth 2 (two) times daily.    [name] gabapentin (NEURONTIN) 300 MG capsule Take 300 mg by mouth 2 (two) times daily.   3  . levothyroxine (SYNTHROID, LEVOTHROID) 50 MCG tablet Take 50 mcg by mouth daily before breakfast.   3  . meloxicam (MOBIC) 15 MG tablet Take 15 mg by mouth daily.  3  . Omega-3 Fatty Acids (FISH OIL PO) Take by mouth.    [name] omeprazole (PRILOSEC) 20 MG capsule Take 20 mg by mouth every evening.     [name] amLODipine (NORVASC) 5 MG tablet Take 1 tablet (5 mg total) by mouth daily. 30 tablet 0  . clonazePAM (KLONOPIN) 1 MG tablet Take 0.5 mg by mouth 2 (two) times daily as needed for anxiety.    [name] HYDROcodone-acetaminophen (NORCO/VICODIN) 5-325 MG tablet Take 1 tablet by mouth every 6 (six) hours as needed (for pain.). for pain  0  . Lecithin 1000 MG CHEW Chew 1,000 mg by mouth 2 (two) times daily.    [name] MILK THISTLE PO Take 1 capsule by mouth 2 (two) times  daily.    . Multiple Vitamin (MULTIVITAMIN WITH MINERALS) TABS tablet Take 1 tablet by mouth daily.    [name] oxyCODONE-acetaminophen (ROXICET) 5-325 MG tablet Take 1-2 tablets by mouth every 4 (four) hours as needed for severe pain. (Patient not taking: Reported on 06/28/2017) 40 tablet 0  . Polyethyl Glyc-Propyl Glyc PF (SYSTANE ULTRA PF) 0.4-0.3 % SOLN Place 1 drop into both eyes 3 (three) times daily as needed (for dry eyes).    . valsartan (DIOVAN) 160 MG tablet Take 160 mg by mouth daily.  3   No current facility-administered medications for this visit.     Allergies  Allergen Reactions  . Rosuvastatin Calcium Diarrhea    Muscle aches  . Statins Other (See Comments)    Muscle aches  . Lamictal [Lamotrigine] Rash    Past Medical History:  Diagnosis Date  . Anxiety   . Arthritis   . Cancer (HCC)    Basal cell  . GERD (gastroesophageal reflux disease)   . HTN (hypertension)   . Hyperlipemia   . Hypothyroidism   . Increased liver enzymes   . PONV (postoperative nausea and vomiting)     Social History   Socioeconomic History  . Marital status: Married    Spouse name: Not on file  . Number of children: 2  .  Years of education: 18  . Highest education level: Not on file  Occupational History  . Occupation: Scientist, clinical (histocompatibility and immunogenetics): [employer]  Social Needs  . Financial resource strain: Not on file  . Food insecurity:    Worry: Not on file    Inability: Not on file  . Transportation needs:    Medical: Not on file    Non-medical: Not on file  Tobacco Use  . Smoking status: Never Smoker  . Smokeless tobacco: Never Used  Substance and Sexual Activity  . Alcohol use: Yes    Alcohol/week: 1.2 oz    Types: 2 Glasses of wine per week    Comment: occasionally  . Drug use: No  . Sexual activity: Never  Lifestyle  . Physical activity:    Days per week: Not on file    Minutes per session: Not on file  . Stress: Not on file  Relationships  . Social connections:    Talks on phone:  Not on file    Gets together: Not on file    Attends religious service: Not on file    Active member of club or organization: Not on file    Attends meetings of clubs or organizations: Not on file    Relationship status: Not on file  . Intimate partner violence:    Fear of current or ex partner: Not on file    Emotionally abused: Not on file    Physically abused: Not on file    Forced sexual activity: Not on file  Other Topics Concern  . Not on file  Social History Narrative  . Not on file     Family History  Problem Relation Age of Onset  . Depression Mother        stroke  . Drug abuse Daughter   . Depression Daughter   . Heart attack Brother 55     Review of Systems: General: negative for chills, fever, night sweats or weight changes.  Cardiovascular: negative for chest pain, dyspnea on exertion, edema, orthopnea, palpitations, paroxysmal nocturnal dyspnea or shortness of breath Dermatological: negative for rash Respiratory: negative for cough or wheezing Urologic: negative for hematuria Abdominal: negative for nausea, vomiting, diarrhea, bright red blood per rectum, melena, or hematemesis Neurologic: negative for visual changes, syncope, or dizziness All other systems reviewed and are otherwise negative except as noted above.    Blood pressure 122/80, pulse 70, height 5\' 3"  (1.6 m), weight 110 lb (49.9 kg).  General appearance: alert, cooperative, no distress and thin Neck: no carotid bruit and no JVD Lungs: clear to auscultation bilaterally Heart: regular rate and rhythm Extremities: extremities normal, atraumatic, no cyanosis or edema and she complained of sweliing and discomfort behind her Rt knee- I could find nothing on exam.  Skin: Skin color, texture, turgor normal. No rashes or lesions Neurologic: Grossly normal  EKG NSR, incomplete RBBB  ASSESSMENT AND PLAN:   Bradycardia Here today with complaints of fatigue and bradycardia "HR in 40-50 range" by her  PCP.  Change diltiazem to Amlodipine  Fatigue Possibly secondary to bradycardia though she is not bradycardic in the office today  HTN (hypertension) Controlled  Dyslipidemia History of statin intolerance-LDL was 139 in 2016   PLAN  Change Diltiazem to Amlodipine. If she is not improved I would see her back, document her labs and get a monitor.   [name] PA-C 06/28/2017 1:22 PM

## 2017-06-28 NOTE — Assessment & Plan Note (Addendum)
Here today with complaints of fatigue and bradycardia "HR in 40-50 range" by her PCP.  Change diltiazem to Amlodipine

## 2017-06-28 NOTE — Assessment & Plan Note (Signed)
History of statin intolerance-LDL was 139 in 2016

## 2017-07-16 ENCOUNTER — Ambulatory Visit: Payer: Medicare Other | Admitting: Adult Health

## 2017-07-23 ENCOUNTER — Other Ambulatory Visit: Payer: Self-pay | Admitting: Cardiology

## 2017-08-29 DIAGNOSIS — M542 Cervicalgia: Secondary | ICD-10-CM | POA: Insufficient documentation

## 2017-08-29 DIAGNOSIS — M545 Low back pain, unspecified: Secondary | ICD-10-CM | POA: Insufficient documentation

## 2018-01-01 ENCOUNTER — Encounter

## 2018-01-01 ENCOUNTER — Encounter: Payer: Self-pay | Admitting: Neurology

## 2018-01-01 ENCOUNTER — Ambulatory Visit: Payer: Medicare Other | Admitting: Neurology

## 2018-01-01 VITALS — BP 140/82 | HR 58 | Ht 63.0 in | Wt 108.0 lb

## 2018-01-01 DIAGNOSIS — M79672 Pain in left foot: Secondary | ICD-10-CM

## 2018-01-01 DIAGNOSIS — M79671 Pain in right foot: Secondary | ICD-10-CM | POA: Diagnosis not present

## 2018-01-01 MED ORDER — GABAPENTIN 300 MG PO CAPS: ORAL_CAPSULE | ORAL | 3 refills | Status: DC

## 2018-01-01 NOTE — Progress Notes (Signed)
Reason for visit: Bilateral foot discomfort  Referring physician: Dr. Terrilee Files Nicole [name] is a 77 y.o. female  History of present illness:  Nicole [name] is a 77 year old white female with a history of underlying diabetes, she has had good control of her diabetes with weight loss and diet.  The patient most recently had a hemoglobin A1c of 5.7.  The patient has had a 2 or 3-year history of a warm or hot sensation in the feet that is uncomfortable for her.  The patient has no discomfort in the morning but as the day goes on the discomfort worsens with weightbearing, the symptoms are most severe when she tries to go to bed at night and this may keep her awake.  She has been on relatively low-dose gabapentin taking 300 mg twice daily without much benefit.  The patient denies any balance issues, she reports no true numbness of the feet.  The sensation of heat and burning is on the bottom and top of the foot, she does have some neck and low back pain without radiation down the arms or legs.  She denies issues controlling the bowels or the bladder.  Due to the ongoing symptoms, she is sent to this office for further evaluation.  Past Medical History:  Diagnosis Date  . Anxiety   . Arthritis   . Cancer (HCC)    Basal cell  . GERD (gastroesophageal reflux disease)   . HTN (hypertension)   . Hyperlipemia   . Hypothyroidism   . Increased liver enzymes   . PONV (postoperative nausea and vomiting)     Past Surgical History:  Procedure Laterality Date  . BREAST BIOPSY Left   . BREAST EXCISIONAL BIOPSY Left   . BREAST SURGERY     reduction  . CARDIOVASCULAR STRESS TEST  05/1998   breast attenuation in anterior septal wall, EF 63%  . CHOLECYSTECTOMY    . FOOT SURGERY    . HIP SURGERY    . REDUCTION MAMMAPLASTY Bilateral   . REVERSE SHOULDER ARTHROPLASTY Left 05/12/2016   Procedure: LEFT REVERSE TOTAL SHOULDER ARTHROPLASTY;  Surgeon: Netta Cedars, MD;  Location: [facility];  Service:  Orthopedics;  Laterality: Left;    Family History  Problem Relation Age of Onset  . Depression Mother        stroke  . Drug abuse Daughter   . Depression Daughter   . Heart attack Brother 31    Social history:  reports that she has never smoked. She has never used smokeless tobacco. She reports that she drinks about 2.0 standard drinks of alcohol per week. She reports that she does not use drugs.  Medications:  Prior to Admission medications   Medication Sig Start Date End Date Taking? Authorizing Provider  amLODipine (NORVASC) 5 MG tablet Take 1 tablet (5 mg total) by mouth daily. 07/23/17  Yes Hilty, Nadean Corwin, MD  Calcium Carbonate-Vitamin D (CALCIUM-VITAMIN D3 PO) Take 15 mLs by mouth 2 (two) times daily.   Yes [provider]  CINNAMON PO Take 1 capsule by mouth 2 (two) times daily.   Yes [provider]  clonazePAM (KLONOPIN) 1 MG tablet Take 0.5 mg by mouth 2 (two) times daily as needed for anxiety.   Yes [provider]  gabapentin (NEURONTIN) 300 MG capsule Take 300 mg by mouth 2 (two) times daily.  10/31/15  Yes [provider]  levothyroxine (SYNTHROID, LEVOTHROID) 50 MCG tablet Take 50 mcg by mouth daily before breakfast.  11/13/13  Yes [provider]  meloxicam (MOBIC) 15 MG tablet Take 15 mg by mouth as needed.  06/12/17  Yes [provider]  MILK THISTLE PO Take 1 capsule by mouth 2 (two) times daily.   Yes [provider]  Multiple Vitamin (MULTIVITAMIN WITH MINERALS) TABS tablet Take 1 tablet by mouth daily.   Yes [provider]  olmesartan (BENICAR) 40 MG tablet olmesartan 40 mg tablet  TAKE 1 TABLET BY MOUTH EVERY DAY   Yes [provider]  Omega-3 Fatty Acids (FISH OIL PO) Take by mouth.   Yes [provider]  omeprazole (PRILOSEC) 20 MG capsule Take 20 mg by mouth as needed.    Yes [provider]  White Petrolatum-Mineral Oil (ARTIFICIAL TEARS) ointment as needed.   Yes  [provider]  Polyethyl Glyc-Propyl Glyc PF (SYSTANE ULTRA PF) 0.4-0.3 % SOLN Place 1 drop into both eyes 3 (three) times daily as needed (for dry eyes).    [provider]      Allergies  Allergen Reactions  . Rosuvastatin Calcium Diarrhea    Muscle aches  . Statins Other (See Comments)    Muscle aches  . Lamictal [Lamotrigine] Rash    ROS:  Out of a complete 14 system review of symptoms, the patient complains only of the following symptoms, and all other reviewed systems are negative.  Swelling in the ankles Constipation Anxiety Insomnia  Blood pressure 140/82, pulse (!) 58, height 5\' 3"  (1.6 m), weight 108 lb (49 kg).  Physical Exam  General: The patient is alert and cooperative at the time of the examination.  Eyes: Pupils are equal, round, and reactive to light. Discs are flat bilaterally.  Neck: The neck is supple, no carotid bruits are noted.  Respiratory: The respiratory examination is clear.  Cardiovascular: The cardiovascular examination reveals a regular rate and rhythm, no obvious murmurs or rubs are noted.  Neuromuscular: The patient has good range of movement of the low back.  Skin: Extremities are without significant edema.  Neurologic Exam  Mental status: The patient is alert and oriented x 3 at the time of the examination. The patient has apparent normal recent and remote memory, with an apparently normal attention span and concentration ability.  Cranial nerves: Facial symmetry is present. There is good sensation of the face to pinprick and soft touch bilaterally. The strength of the facial muscles and the muscles to head turning and shoulder shrug are normal bilaterally. Speech is well enunciated, no aphasia or dysarthria is noted. Extraocular movements are full. Visual fields are full. The tongue is midline, and the patient has symmetric elevation of the soft palate. No obvious hearing deficits are noted.  Motor: The motor  testing reveals 5 over 5 strength of all 4 extremities. Good symmetric motor tone is noted throughout.  Sensory: Sensory testing is intact to pinprick, soft touch, vibration sensation, and position sense on all 4 extremities. No evidence of extinction is noted.  Coordination: Cerebellar testing reveals good finger-nose-finger and heel-to-shin bilaterally.  Gait and station: Gait is normal. Tandem gait is normal. Romberg is negative. No drift is seen.  The patient is able to walk on heels and the toes bilaterally.  Reflexes: Deep tendon reflexes are symmetric and normal bilaterally, with exception of a reduction in the left ankle jerk reflex, the right ankle jerk is well-maintained. Toes are downgoing bilaterally.   Assessment/Plan:  1.  Bilateral foot discomfort  The clinical examination does not show definite evidence  of a peripheral neuropathy, the patient has no sensory alteration in the feet, and she has good reflexes throughout with exception of the left ankle jerk reflex.  The patient does have low back pain, spinal stenosis does need to be considered.  The patient will be set up for nerve conduction studies on both legs, EMG on the left leg.  If a peripheral neuropathy is present, further blood work will be done.  If not, MRI of the lumbar spine will be done.  The gabapentin dosing will be increased to 300 mg in the morning and 600 mg in the evening.  The patient will follow-up in 4 months.  Jill Alexanders MD 01/01/2018 12:19 PM  [facility] [address] [place], [place] [address]  Phone [phone] Fax [phone]

## 2018-01-01 NOTE — Patient Instructions (Signed)
We will go up on the gabapentin to 300 mg in the morning and 600 mg in the evening. 

## 2018-02-12 ENCOUNTER — Telehealth: Payer: Self-pay | Admitting: Neurology

## 2018-02-12 ENCOUNTER — Ambulatory Visit: Payer: Medicare Other | Admitting: Neurology

## 2018-02-12 ENCOUNTER — Ambulatory Visit (INDEPENDENT_AMBULATORY_CARE_PROVIDER_SITE_OTHER): Payer: Medicare Other | Admitting: Neurology

## 2018-02-12 ENCOUNTER — Encounter: Payer: Self-pay | Admitting: Neurology

## 2018-02-12 DIAGNOSIS — R202 Paresthesia of skin: Secondary | ICD-10-CM

## 2018-02-12 DIAGNOSIS — G8929 Other chronic pain: Secondary | ICD-10-CM

## 2018-02-12 DIAGNOSIS — M79671 Pain in right foot: Secondary | ICD-10-CM

## 2018-02-12 DIAGNOSIS — M79672 Pain in left foot: Secondary | ICD-10-CM

## 2018-02-12 DIAGNOSIS — M545 Low back pain: Secondary | ICD-10-CM

## 2018-02-12 NOTE — Procedures (Signed)
     HISTORY:  Nicole [name] is a 78 year old patient with a history of chronic low back pain.  The patient reports burning sensations in the feet that keep her awake at night.  She does have a history of diabetes, but she is controlling her blood sugars quite well.  She is being evaluated for a possible neuropathy or a lumbosacral radiculopathy.  NERVE CONDUCTION STUDIES:  Nerve conduction studies were performed on both lower extremities. The distal motor latencies and motor amplitudes for the peroneal and posterior tibial nerves were within normal limits. The nerve conduction velocities for these nerves were also normal. The sensory latencies for the peroneal and sural nerves were within normal limits. The F wave latencies for the posterior tibial nerves were within normal limits.   EMG STUDIES:  EMG study was performed on the left lower extremity:  The tibialis anterior muscle reveals 2 to 4K motor units with full recruitment. No fibrillations or positive waves were seen. The peroneus tertius muscle reveals 2 to 5K motor units with decreased recruitment. No fibrillations or positive waves were seen. The medial gastrocnemius muscle reveals 1 to 3K motor units with full recruitment. No fibrillations or positive waves were seen. The vastus lateralis muscle reveals 2 to 4K motor units with full recruitment. No fibrillations or positive waves were seen. The iliopsoas muscle reveals 2 to 4K motor units with full recruitment. No fibrillations or positive waves were seen. The biceps femoris muscle (long head) reveals 2 to 4K motor units with full recruitment. No fibrillations or positive waves were seen. The lumbosacral paraspinal muscles were tested at 3 levels, and revealed no abnormalities of insertional activity at all 3 levels tested. There was good relaxation.   IMPRESSION:  Nerve conduction studies done on both lower extremities were unremarkable, no evidence of a peripheral  neuropathy was seen.  A small fiber neuropathy cannot be fully excluded by this study.  The EMG evaluation of the left lower extremity was relatively unremarkable, no clear evidence of an overlying lumbosacral radiculopathy was seen.  Jill Alexanders MD 02/12/2018 1:39 PM  [facility] [address] [place], [place] [address]  Phone [phone] Fax [phone]

## 2018-02-12 NOTE — Telephone Encounter (Signed)
[facility] Medicare order sent to GI pt is aware

## 2018-02-12 NOTE — Progress Notes (Addendum)
The patient comes in for EMG and nerve conduction study evaluation.  Nerve conduction studies do not show evidence of a peripheral neuropathy, a small fiber neuropathy cannot be excluded.  EMG of the left lower extremity does not show clear evidence of an overlying lumbosacral radiculopathy.  The patient continues to have discomfort and burning sensation in the feet, she does have persistent low back pain.  We will check MRI of the lumbar spine to evaluate her for possible spinal stenosis.  The gabapentin does not seem to be extremely helpful in preventing some of the discomfort and pain.  The patient indicates that her eye doctor is noted a fleck of metal in her eye, for this reason, the MRI of the low back will be canceled, will get a CT scan of the lumbar spine instead.      [place]    Nerve / Sites Muscle Latency Ref. Amplitude Ref. Rel Amp Segments Distance Velocity Ref. Area    ms ms mV mV %  cm m/s m/s mVms  R Peroneal - EDB     Ankle EDB 5.1 ?6.5 5.1 ?2.0 100 Ankle - EDB 9   21.3     Fib head EDB 11.0  4.6  89 Fib head - Ankle 29 49 ?44 17.9     Pop fossa EDB 13.3  4.4  96.8 Pop fossa - Fib head 10 44 ?44 17.0         Pop fossa - Ankle      L Peroneal - EDB     Ankle EDB 4.3 ?6.5 5.6 ?2.0 100 Ankle - EDB 9   20.3     Fib head EDB 10.6  4.5  80.4 Fib head - Ankle 28 44 ?44 17.8     Pop fossa EDB 12.9  4.6  101 Pop fossa - Fib head 10 44 ?44 18.5         Pop fossa - Ankle      R Tibial - AH     Ankle AH 4.1 ?5.8 20.5 ?4.0 100 Ankle - AH 9   39.1     Pop fossa AH 12.1  13.8  67.3 Pop fossa - Ankle 35 44 ?41 34.9  L Tibial - AH     Ankle AH 4.7 ?5.8 14.7 ?4.0 100 Ankle - AH 9   35.7     Pop fossa AH 12.6  10.9  74 Pop fossa - Ankle 33 42 ?41 34.8             SNC    Nerve / Sites Rec. Site Peak Lat Ref.  Amp Ref. Segments Distance    ms ms V V  cm  R Sural - Ankle (Calf)     Calf Ankle 3.6 ?4.4 12 ?6 Calf - Ankle 14  L Sural - Ankle (Calf)     Calf Ankle 4.1 ?4.4 9 ?6 Calf -  Ankle 14  R Superficial peroneal - Ankle     Lat leg Ankle 3.9 ?4.4 6 ?6 Lat leg - Ankle 14  L Superficial peroneal - Ankle     Lat leg Ankle 3.9 ?4.4 7 ?6 Lat leg - Ankle 14              F  Wave    Nerve F Lat Ref.   ms ms  R Tibial - AH 52.2 ?56.0  L Tibial - AH 52.7 ?56.0

## 2018-02-12 NOTE — Progress Notes (Signed)
Please refer to EMG and nerve conduction procedure note.  

## 2018-02-20 ENCOUNTER — Telehealth: Payer: Self-pay | Admitting: Neurology

## 2018-02-20 ENCOUNTER — Ambulatory Visit
Admission: RE | Admit: 2018-02-20 | Discharge: 2018-02-20 | Disposition: A | Discharge status: Home / Self Care | Payer: Medicare Other | Source: Ambulatory Visit | Attending: Neurology | Admitting: Neurology

## 2018-02-20 DIAGNOSIS — M79672 Pain in left foot: Principal | ICD-10-CM

## 2018-02-20 DIAGNOSIS — R202 Paresthesia of skin: Secondary | ICD-10-CM

## 2018-02-20 DIAGNOSIS — M545 Low back pain: Secondary | ICD-10-CM

## 2018-02-20 DIAGNOSIS — M79671 Pain in right foot: Secondary | ICD-10-CM

## 2018-02-20 DIAGNOSIS — G8929 Other chronic pain: Secondary | ICD-10-CM

## 2018-02-20 NOTE — Telephone Encounter (Signed)
  I called the patient.  CT of the low back is relatively unremarkable, no evidence of spinal stenosis or nerve root impingement, the patient does have multilevel degenerative disc changes.  There is nothing here that are be causing foot discomfort.  The etiology of the pain is not clear, the patient is not getting much benefit from the gabapentin.  CT lumbar 02/20/18:  IMPRESSION: Lumbar scoliosis with multilevel degenerative change throughout the lumbar spine as above.  Mild spinal stenosis L4-5.

## 2018-02-21 MED ORDER — DULOXETINE HCL 30 MG PO CPEP: ORAL_CAPSULE | ORAL | 3 refills | Status: DC

## 2018-02-21 NOTE — Addendum Note (Signed)
Addended by: Kathrynn Ducking on: 02/21/2018 05:25 PM   Modules accepted: Orders

## 2018-02-21 NOTE — Telephone Encounter (Signed)
Pt has called back in response to Dr Tobey Grim message.  Pt is asking if he will be calling in medication for her. Pt would like to use [id number]  Please call

## 2018-02-21 NOTE — Telephone Encounter (Signed)
I called the patient.  The patient is not getting any benefit with the hot or burning sensation in the feet with the gabapentin, we will switch over to Cymbalta taking 30 mg daily for 2 weeks and go to 30 mg twice daily.

## 2018-03-25 ENCOUNTER — Telehealth: Payer: Self-pay | Admitting: Neurology

## 2018-03-25 NOTE — Telephone Encounter (Signed)
Pt is calling to inform Dr Jannifer Franklin that the DULoxetine (CYMBALTA) 30 MG capsule has helped her a lot but she is experiencing some side effects and would like a call to discuss

## 2018-03-25 NOTE — Telephone Encounter (Signed)
Patient called in to give an update on the Cymbalta dosage. Pt sates the 30 mg table 1 daily has been helpful with nerve pain in her feet, but she feels like the medication is causing some unwanted side effects. Pt states she has been anxious/nervous, fatigued and still having trouble sleeping at night. She states she has not in creased to 1 tablet bid yet. She is requesting Dr. Jannifer Franklin review the message and advise on if he thinks its a good idea to continue the medication and proceed with the increase or if an alternative medication would be recommended.   Pt was advised Dr. Jannifer Franklin is currently out of the office and will return on 03/26/18. Pt was agreeable to waiting for Dr. Jannifer Franklin to return so he could advise on message.

## 2018-03-26 NOTE — Telephone Encounter (Signed)
I called the patient.  The Cymbalta was helpful for her neuropathy until last evening, the patient is also concerned about some feelings of being nervous and jittery on the medication, I would try to go to 30 mg twice daily, if the symptoms worsen, we will have to stop the medication and go to something different such as Keppra for the neuropathy pain.

## 2018-03-26 NOTE — Telephone Encounter (Signed)
I called the patient, left message, I will call back later. 

## 2018-03-26 NOTE — Telephone Encounter (Signed)
Pt called back, last night her feet kept you awake because they were hot. She said this is the 1st time it has happened since starting the medication.

## 2018-04-02 ENCOUNTER — Other Ambulatory Visit: Payer: Self-pay | Admitting: Family Medicine

## 2018-04-02 DIAGNOSIS — Z1231 Encounter for screening mammogram for malignant neoplasm of breast: Secondary | ICD-10-CM

## 2018-04-08 ENCOUNTER — Telehealth: Payer: Self-pay | Admitting: Neurology

## 2018-04-08 MED ORDER — LEVETIRACETAM 250 MG PO TABS: 250.0000 mg | ORAL_TABLET | Freq: Two times a day (BID) | ORAL | 2 refills | Status: DC

## 2018-04-08 NOTE — Telephone Encounter (Signed)
Pt was prescribed DULoxetine (CYMBALTA) 30 MG capsule and now she is having side effects of nervousness and anxiety . Pt does have a fatty liver and did not know if it would affect her since it states that it affects the liver. She would like to know if there is something else that she can take that will not give her these side effects. Please advise.

## 2018-04-08 NOTE — Addendum Note (Signed)
Addended by: Kathrynn Ducking on: 04/08/2018 06:49 PM   Modules accepted: Orders

## 2018-04-08 NOTE — Telephone Encounter (Signed)
I called the patient.  The patient indicates that she has been on nortriptyline or amitriptyline previously, she cannot tolerate any antidepressant medication.  The patient will be given a trial on low-dose Keppra, she will stop the Cymbalta.

## 2018-04-08 NOTE — Telephone Encounter (Signed)
I called the patient.  I left a message, I will call back later.  We could potentially use another medication such as nortriptyline for the neuropathy.

## 2018-04-30 ENCOUNTER — Ambulatory Visit: Payer: Self-pay

## 2018-04-30 ENCOUNTER — Telehealth: Payer: Self-pay

## 2018-04-30 NOTE — Telephone Encounter (Signed)
LVM to discuss changing to a video or tele visit OFFER another time for appt KW 4/7   If she would like to set up the virtual visit, please choose a time that works for her and get an email address, thanks.

## 2018-05-01 ENCOUNTER — Ambulatory Visit: Payer: Medicare Other | Admitting: Neurology

## 2018-05-02 ENCOUNTER — Ambulatory Visit: Payer: Self-pay

## 2018-05-03 ENCOUNTER — Ambulatory Visit: Payer: Medicare Other | Admitting: Neurology

## 2018-05-07 ENCOUNTER — Telehealth: Payer: Self-pay | Admitting: Neurology

## 2018-05-07 MED ORDER — CARBAMAZEPINE 100 MG PO CHEW: CHEWABLE_TABLET | ORAL | 2 refills | Status: DC

## 2018-05-07 NOTE — Telephone Encounter (Signed)
I called the patient.  The patient cannot tolerate Keppra, she will stop the drug.  We will start low-dose carbamazepine, she will call for any dose adjustments.  The patient claims that she does have a history of fatty liver, but she has not had significant issues with cirrhosis or liver enzyme elevation.

## 2018-05-07 NOTE — Telephone Encounter (Signed)
Pt called in and stated the Keppra has made her depression worse and its not helping at all

## 2018-05-07 NOTE — Telephone Encounter (Signed)
I contacted the pt. She states the keppra has not been beneficial for her paresthesia and has increased her depression. Pt declined telephone visit and video visit and requested message to be sent to MD. I advised Dr. Jannifer Franklin will be out of the office until 05/08/18 and pt verbalized understanding.

## 2018-05-07 NOTE — Telephone Encounter (Signed)
I called the patient.  The patient could not tolerate the Keppra secondary to depression, she will stop the medication, we may try carbamazepine for the pain, she could not tolerate Cymbalta either.

## 2018-05-21 ENCOUNTER — Telehealth: Payer: Self-pay | Admitting: Neurology

## 2018-05-21 MED ORDER — CARBAMAZEPINE 200 MG PO TABS: 200.0000 mg | ORAL_TABLET | Freq: Two times a day (BID) | ORAL | 2 refills | Status: DC

## 2018-05-21 NOTE — Addendum Note (Signed)
Addended by: Kathrynn Ducking on: 05/21/2018 03:04 PM   Modules accepted: Orders

## 2018-05-21 NOTE — Telephone Encounter (Signed)
I contacted the pt. She states she has been on tegretol for 2 weeks and states it has not provided any relief.   Pt states she has taken cymbalta in the past and reports this helped the best but is wondering if another medication could be recommended? I advised I would send to Dr. Jannifer Franklin to review and advise, pt was agreeable.

## 2018-05-21 NOTE — Telephone Encounter (Signed)
Lvm for patient to return my call.

## 2018-05-21 NOTE — Telephone Encounter (Signed)
I called the patient.  The patient is still having some issues with her neuropathy pain.  She is on 100mg  carbamazepine twice daily, she believes that she is having some slight dizziness on the medication.  She is overall tolerating the 100 mg twice daily dose well.  She talks about wanting to go back on Cymbalta but we stopped the Cymbalta because she could not tolerate it, the medication made her nervous and jittery, she has been on amitriptyline and nortriptyline and could not tolerate these medications either.  We will try going up on the carbamazepine to 300 mg daily for a week and then go to 200 mg twice daily.  I will send in a prescription.  She will follow-up with me in about 3 weeks.

## 2018-05-21 NOTE — Telephone Encounter (Signed)
Pt states that the carbamazepine (TEGRETOL) 100 MG chewable tablet is not working as well as the previous medication that she was on . Pt states she prefers the other medication or if there is something else that she can try. Please advise.

## 2018-05-21 NOTE — Telephone Encounter (Signed)
Pt has called RN Megan back, she is asking for another call please

## 2018-06-11 DIAGNOSIS — M26623 Arthralgia of bilateral temporomandibular joint: Secondary | ICD-10-CM | POA: Insufficient documentation

## 2018-06-11 DIAGNOSIS — M4712 Other spondylosis with myelopathy, cervical region: Secondary | ICD-10-CM | POA: Insufficient documentation

## 2018-06-12 ENCOUNTER — Ambulatory Visit: Payer: Self-pay

## 2018-07-01 ENCOUNTER — Telehealth: Payer: Self-pay | Admitting: Neurology

## 2018-07-01 MED ORDER — DULOXETINE HCL 30 MG PO CPEP: ORAL_CAPSULE | ORAL | 3 refills | Status: DC

## 2018-07-01 NOTE — Telephone Encounter (Signed)
Pt called in and stated she would like to discuss carbamazepine (TEGRETOL) 200 MG tablet with Dr Jannifer Franklin

## 2018-07-01 NOTE — Telephone Encounter (Signed)
I called the patient.  She has decided that the carbamazepine is making her feel depressed.  We will go off the medication by taking half tablet twice daily for 1 week and then stop the drug.  Even though she felt nervous and jittery on Cymbalta, she claims that she would prefer to be on the Cymbalta as it made her less depressed and help the neuropathy pain.  We will get back on Cymbalta working up to 30 mg twice daily.

## 2018-07-01 NOTE — Telephone Encounter (Signed)
I reached out to the pt and she states she would like for Dr. Jannifer Franklin to call her so she could review with him her Carbamazepine along with the Cymbalta she was previously rx'd.

## 2018-07-03 ENCOUNTER — Ambulatory Visit: Payer: Self-pay

## 2018-07-12 ENCOUNTER — Ambulatory Visit: Payer: Medicare Other

## 2018-07-24 ENCOUNTER — Other Ambulatory Visit: Payer: Self-pay | Admitting: Internal Medicine

## 2018-08-06 ENCOUNTER — Telehealth: Payer: Self-pay | Admitting: Neurology

## 2018-08-06 NOTE — Telephone Encounter (Signed)
Lvm asking for a call back

## 2018-08-06 NOTE — Telephone Encounter (Signed)
Patient called and requested to get a message to Dr. Jannifer Franklin, stating that her medication is causing her to have side effects that are greater than the benefits she is receiving. She would like to speak with him or his nurse regarding this. Please call and advise.

## 2018-08-07 MED ORDER — LAMOTRIGINE 25 MG PO TABS: ORAL_TABLET | ORAL | 1 refills | Status: DC

## 2018-08-07 NOTE — Telephone Encounter (Signed)
I reached out to the pt. She states since going back on the Cymbalta 30 mg she has had increased shakiness, fatigue, balance has decreased and she notices her feet have been cool to the touch.  Pt wanted MD to be notified of her symptoms and advise if further medication changes could be recommended? Best call back # is [phone]

## 2018-08-07 NOTE — Telephone Encounter (Signed)
Pt returning call please call back °

## 2018-08-07 NOTE — Addendum Note (Signed)
Addended by: Kathrynn Ducking on: 08/07/2018 04:54 PM   Modules accepted: Orders

## 2018-08-07 NOTE — Telephone Encounter (Signed)
I called the patient.  The patient cannot tolerate the Cymbalta, she has not done well on gabapentin, Keppra, or carbamazepine.  In the past she has been on Lamictal for a mood stabilizer which was very helpful, she developed a rash, she is not sure how quickly they went up on the medication, she is not sure whether they went up slowly or not.  We will retry the Lamictal taking 25 mg twice daily for 2 weeks and then go to 50 mg twice daily, if the rash recurs, we will have to stop the drug.  The patient will stop the Cymbalta going to 30 mg daily for 5 days and then stop.

## 2018-08-15 NOTE — Telephone Encounter (Signed)
Pt called in and stated she has broken out in a rash again , and she has mouth ulcers.

## 2018-08-15 NOTE — Telephone Encounter (Signed)
Left vm for patient to call back about mouth ulcers and rash that has occurred again

## 2018-08-19 ENCOUNTER — Telehealth: Payer: Self-pay | Admitting: *Deleted

## 2018-08-19 MED ORDER — LACOSAMIDE 50 MG PO TABS: ORAL_TABLET | ORAL | 3 refills | Status: DC

## 2018-08-19 NOTE — Telephone Encounter (Signed)
I called the patient.  The patient developed a rash on the Lamictal.  We will stop the medication, try Vimpat but she is not sure she can afford it.

## 2018-08-19 NOTE — Telephone Encounter (Signed)
States she called last week and continues to have symptoms.  Has a rash, not hives like before, on her leg.  PCP did labs that all came back normal.  Was advised to call you to see if it was the medication you have her on.

## 2018-08-19 NOTE — Telephone Encounter (Signed)
Patient came by the office today due to phones being out. Reported a rash on her leg to front desk staff and wanted to know if Lamictal could be the cause? Pt was advised that our phones were currently down and she does not have an active my chart account.

## 2018-08-21 ENCOUNTER — Telehealth: Payer: Self-pay | Admitting: Neurology

## 2018-08-21 NOTE — Telephone Encounter (Signed)
Pt called stating that she can not afford the lacosamide (VIMPAT) 50 MG TABS tablet so she will try to take the Cymbalta again. Please advise.

## 2018-08-21 NOTE — Telephone Encounter (Signed)
Nicole Ducking, MD     08/19/18 2:28 PM Note   I called the patient.  The patient developed a rash on the Lamictal.  We will stop the medication, try Vimpat but she is not sure she can afford it.

## 2018-08-21 NOTE — Telephone Encounter (Signed)
I called the patient.  The patient could not tolerate the Vimpat, she will retry the Cymbalta, if this is not effective, we may try Lyrica even though the gabapentin previously did not help a lot.  Sometimes Lyrica can offer much better benefit than gabapentin.

## 2018-08-21 NOTE — Telephone Encounter (Signed)
I tried to call the patient, unable to leave a message, mailbox is full, I will call back later.

## 2018-08-21 NOTE — Telephone Encounter (Signed)
I called the pharmacy and spoke with one of the techs. She advised the pt's vimpat was covered by insurance but her insurance will only pay 75 % which leaves 150 $ for the pt to pay out of pocket.   Pt states this is too much and would like to try another alternative or go back on the Cymbalta again?

## 2018-08-23 ENCOUNTER — Other Ambulatory Visit: Payer: Self-pay

## 2018-08-23 ENCOUNTER — Ambulatory Visit
Admission: RE | Admit: 2018-08-23 | Discharge: 2018-08-23 | Disposition: A | Discharge status: Home / Self Care | Payer: Medicare Other | Source: Ambulatory Visit | Attending: Family Medicine | Admitting: Family Medicine

## 2018-08-23 DIAGNOSIS — Z1231 Encounter for screening mammogram for malignant neoplasm of breast: Secondary | ICD-10-CM

## 2018-08-30 ENCOUNTER — Other Ambulatory Visit: Payer: Self-pay | Admitting: Internal Medicine

## 2018-09-03 ENCOUNTER — Other Ambulatory Visit: Payer: Self-pay | Admitting: Family Medicine

## 2018-09-03 DIAGNOSIS — E559 Vitamin D deficiency, unspecified: Secondary | ICD-10-CM

## 2018-09-03 DIAGNOSIS — M810 Age-related osteoporosis without current pathological fracture: Secondary | ICD-10-CM

## 2018-09-11 ENCOUNTER — Telehealth: Payer: Self-pay | Admitting: Neurology

## 2018-09-11 NOTE — Telephone Encounter (Signed)
Pt would like provider or RN to call back do discuss dosage of her Cymbalta. Please advise.

## 2018-09-11 NOTE — Telephone Encounter (Signed)
Patient states that she had restarted the cymbalta as discussed with Dr Jannifer Franklin. She is currently taking 30 mg cymbalta and has increased to 30 mg BID for 2 weeks. She states that she has had relief with restarting this medication but wanted to discuss increasing again. Advised that I would discuss with the work in MD about increasing the medication further. Informed Dr Jannifer Franklin will be back in the office on Monday. Pt verbalized understanding.

## 2018-09-12 NOTE — Telephone Encounter (Signed)
Called the patient, there was no answer. LVM for the patient. Informed her of addressing her concern with Dr Brett Fairy. Advised the patient in VM to take 30 mg in am and then 60 mg in evening dose. Advised to take like this for a week and if tolerating well or feels she can increase to 60 mg BID call the office and address the increase with Dr Jannifer Franklin at that time. Informed the patient to call back with any questions that she may have.

## 2018-09-12 NOTE — Telephone Encounter (Signed)
Start with up-titration. Take 60 mg at PM dose for one week and next week go to 60 mg bid po. Please discuss further next week with Dr. Jannifer Franklin.

## 2018-10-04 ENCOUNTER — Other Ambulatory Visit: Payer: Medicare Other

## 2018-10-07 ENCOUNTER — Ambulatory Visit
Admission: RE | Admit: 2018-10-07 | Discharge: 2018-10-07 | Disposition: A | Discharge status: Home / Self Care | Payer: Medicare Other | Source: Ambulatory Visit | Attending: Gastroenterology | Admitting: Gastroenterology

## 2018-10-07 ENCOUNTER — Other Ambulatory Visit: Payer: Self-pay | Admitting: Gastroenterology

## 2018-10-07 DIAGNOSIS — K5900 Constipation, unspecified: Secondary | ICD-10-CM

## 2018-10-16 ENCOUNTER — Other Ambulatory Visit: Payer: Self-pay | Admitting: Internal Medicine

## 2018-10-24 ENCOUNTER — Telehealth: Payer: Self-pay | Admitting: Neurology

## 2018-10-24 MED ORDER — DULOXETINE HCL 60 MG PO CPEP: 60.0000 mg | ORAL_CAPSULE | Freq: Two times a day (BID) | ORAL | 1 refills | Status: DC

## 2018-10-24 NOTE — Telephone Encounter (Addendum)
Pt returned call. She reports she needs her Cymbalta 60 mg refilled. Pt states she currently is taking 1 tab in the am and 2 in the pm ( this med is not listed on the [facility]).  Pt is wondering if she should continue this dosage on increase due to warm sensations still being equally present in feet (bilaterally). I advised the pt I would fwd message to MD to review and advise on.

## 2018-10-24 NOTE — Telephone Encounter (Signed)
I called the patient.  The patient is on 90 mg daily of Cymbalta, we can go up to the 120 mg dosing taking 60 mg twice daily, I will send in a prescription.

## 2018-10-24 NOTE — Telephone Encounter (Signed)
Pt is needing a refill on her cymbalta but she is wanting to know if she will be staying on the same dosage or will it be going up. Please advise.

## 2018-10-24 NOTE — Telephone Encounter (Signed)
I called the patient.  Left message, I will call back later.

## 2018-10-24 NOTE — Telephone Encounter (Signed)
Requested a call back from the pt to further discuss.  

## 2018-10-24 NOTE — Addendum Note (Signed)
Addended by: Kathrynn Ducking on: 10/24/2018 04:22 PM   Modules accepted: Orders

## 2018-11-19 ENCOUNTER — Other Ambulatory Visit: Payer: Self-pay | Admitting: Internal Medicine

## 2018-11-30 IMAGING — CT CT SHOULDER*L* W/O CM
3 of 6 series · 7 of 20 positions shown, 8 images · non-contrast
Comparison: None.

CLINICAL DATA: Left shoulder replacement 05/12/2016. Severe pain
started 1 day ago.

EXAM:
CT OF THE UPPER LEFT EXTREMITY WITHOUT CONTRAST
TECHNIQUE: Multidetector CT imaging of the upper left extremity was performed
according to the standard protocol.

[Series 2: soft tissue · axial · 0.42mm/px · z∈[+424,+504]mm · 2 of 122 slices shown]
[im 41/122  soft-tissue]
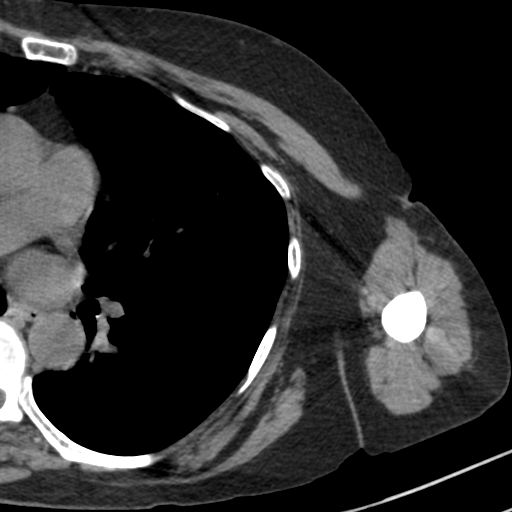
[im 81/122  soft-tissue]
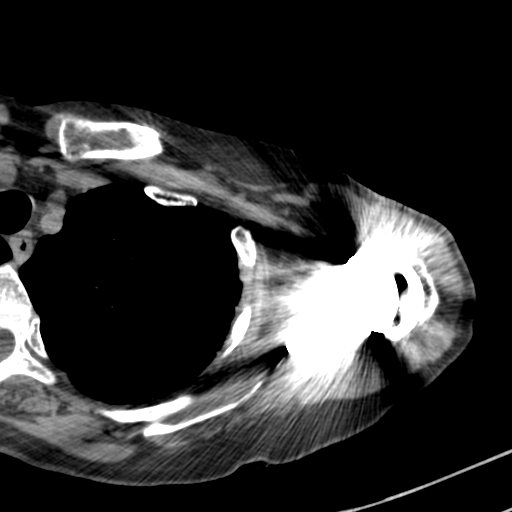

[Series 602: bone cor · axial · 0.47mm/px · z∈[+357,+428]mm · 2 of 124 slices shown, 3 images]
[im 42/124  soft-tissue]
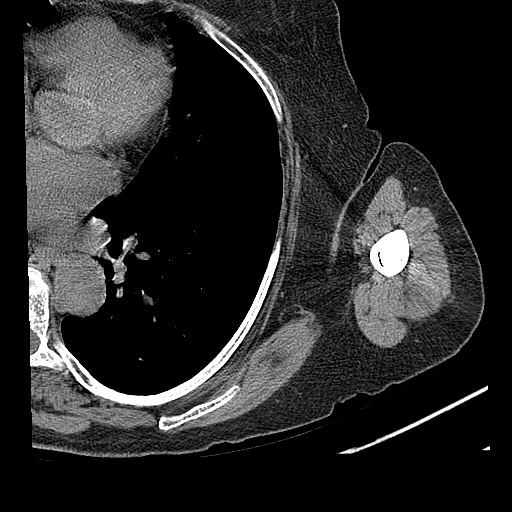
[im 42/124  bone]
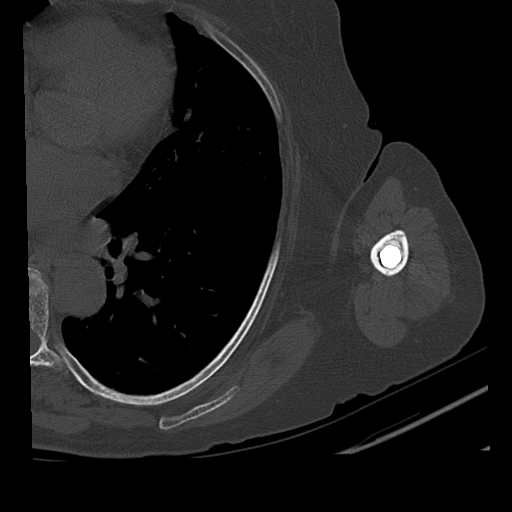
[im 83/124  bone]
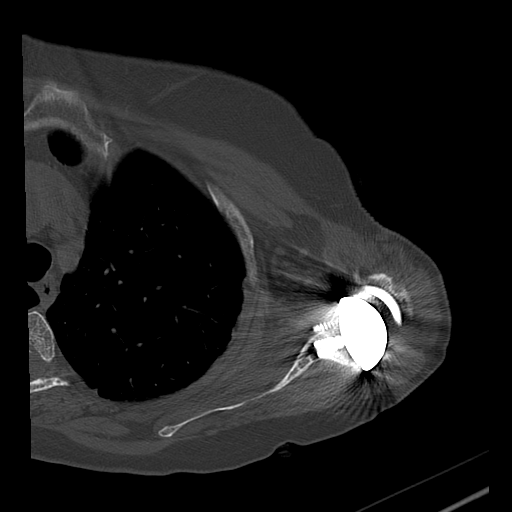

[Series 606: sag bone · coronal · 0.47mm/px · 3 of 115 slices shown]
[im 32/115  bone]
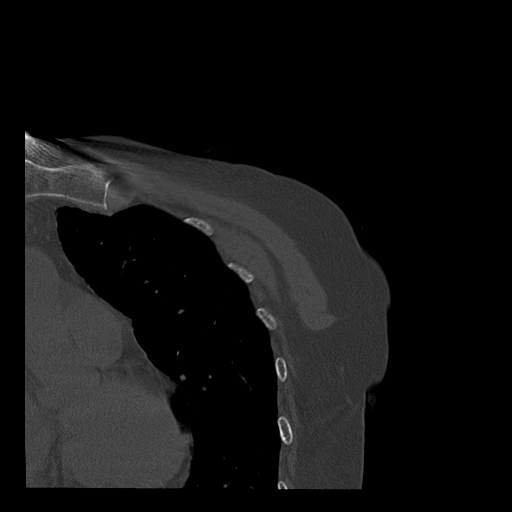
[im 66/115  bone]
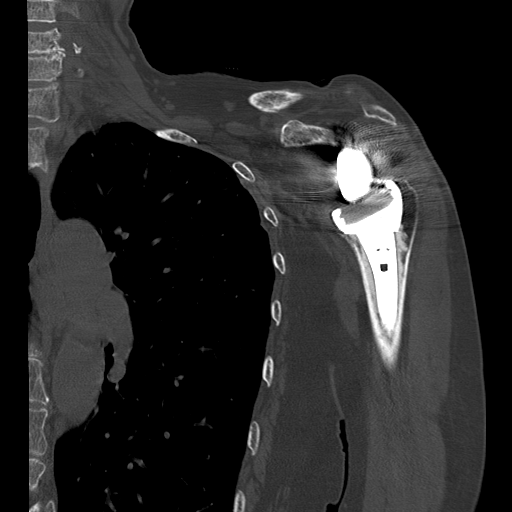
[im 101/115  bone]
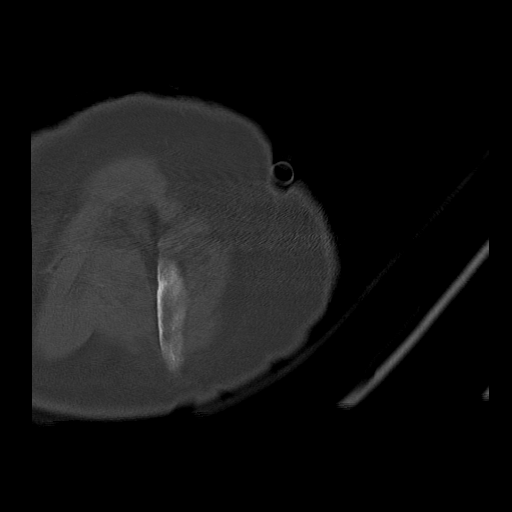

[7 of 20 positions shown; findings below may reference images not displayed]

FINDINGS: Bones/Joint/Cartilage

Left total shoulder arthroplasty without hardware failure or
complication. No osteolysis.

Nondisplaced fracture of the chromium. No surrounding callus
formation. No glenohumeral dislocation. Mild arthropathy of the
acromioclavicular joint. Normal alignment. No joint effusion.

Severe degenerative disc disease with disc height loss at C5-6.

Ligaments

Ligaments are suboptimally evaluated by CT.

Muscles and Tendons
Muscles are normal.  No muscle atrophy.

Soft tissue
No fluid collection or hematoma. No soft tissue mass. Visualized
left lung is clear.
IMPRESSION: 1. Acute nondisplaced fracture of the acromion.
2. Left shoulder arthroplasty without hardware failure or
complication.

## 2018-12-26 ENCOUNTER — Telehealth: Payer: Self-pay | Admitting: Neurology

## 2018-12-26 NOTE — Telephone Encounter (Signed)
Pt called in and stated she has stopped the Cymbalta about a week ago it didn't help the nerve pain in her feet , she wants to know if there is another option. She wants to know if Tumeric is good for her to take for her joint pain

## 2018-12-26 NOTE — Telephone Encounter (Signed)
Reviewed with Dr. Jaynee Eagles. Pt needs an appt to discuss. I called the pt and LVM (ok per DPR) advising we had received her message and we recommend she make an appt to discuss other options. I advised the office is closed now. Currently Megan NP has opening on Tues 12/8 but may not stay open. Asked pt to call back tomorrow between 8-12 to schedule.

## 2019-01-02 ENCOUNTER — Encounter: Payer: Self-pay | Admitting: Adult Health

## 2019-01-02 ENCOUNTER — Ambulatory Visit: Payer: Medicare Other | Admitting: Adult Health

## 2019-01-02 ENCOUNTER — Encounter (INDEPENDENT_AMBULATORY_CARE_PROVIDER_SITE_OTHER): Payer: Self-pay

## 2019-01-02 ENCOUNTER — Telehealth (INDEPENDENT_AMBULATORY_CARE_PROVIDER_SITE_OTHER): Payer: Medicare Other | Admitting: Adult Health

## 2019-01-02 ENCOUNTER — Other Ambulatory Visit: Payer: Self-pay

## 2019-01-02 DIAGNOSIS — R202 Paresthesia of skin: Secondary | ICD-10-CM

## 2019-01-02 MED ORDER — PREGABALIN 25 MG PO CAPS: 25.0000 mg | ORAL_CAPSULE | Freq: Two times a day (BID) | ORAL | 5 refills | Status: DC

## 2019-01-02 NOTE — Progress Notes (Addendum)
  [facility] [address] [place]. [place] [address] [phone]     Virtual Visit via Telephone Note  I connected with Nicole [name] on 01/02/19 at  3:30 PM EST by telephone located remotely at [facility] and verified that I am speaking with the correct person using two identifiers who reports being located at work.   Visit scheduled by RN She discussed the limitations, risks, security and privacy concerns of performing an evaluation and management service by telephone and the availability of in person appointments. I also discussed with the patient that there may be a patient responsible charge related to this service. The patient expressed understanding and agreed to proceed. See telephone note for consent and additional scheduling information.    History of Present Illness:  Nicole [name] is a 77 y.o. female who has been followed in this office for paresthesias in the lower extremity.  She was initially scheduled for a video visit however the [place] was down and we transitioned to a telephone visit.    She reports that she continues to have a warm sensation in the feet.  She denies any sharp shooting discomfort in the feet.  She states that she is on her feet for approximately 4 to 5 hours a day with her job.  She states that the discomfort is there standing and at rest.  She states that she has a hard time sleeping because of the sensation in the feet.  She has tried gabapentin, Cymbalta, Vimpat and Lamictal with no benefit.      Observations/Objective:    Neurological examination  Mentation: Alert oriented to time, place, history taking.speech and language fluent  Assessment and Plan:  1.  Paresthesias lower extremities  The patient will try Lyrica 25 mg twice a day.  I have reviewed potential side effects with the patient.  She is advised that if this medication is not beneficial or she is unable to  tolerate it she should let us know.  Follow Up Instructions:    Follow-up in 6 months   I discussed the assessment and treatment plan with the patient.  The patient was provided an opportunity to ask questions and all were answered to their satisfaction. The patient agreed with the plan and verbalized an understanding of the instructions.   I provided 15 minutes of non-face-to-face time during this encounter.     Ward Givens, MSN, NP-C 01/02/2019, 4:06 PM [facility] [address] [place], [place] [address] [phone]  I have read the note, and I agree with the clinical assessment and plan.  Kathrynn Ducking

## 2019-01-03 ENCOUNTER — Other Ambulatory Visit: Payer: Self-pay | Admitting: Family Medicine

## 2019-01-03 DIAGNOSIS — M810 Age-related osteoporosis without current pathological fracture: Secondary | ICD-10-CM

## 2019-01-06 ENCOUNTER — Other Ambulatory Visit: Payer: Self-pay | Admitting: Cardiology

## 2019-01-07 NOTE — Telephone Encounter (Signed)
Rx request sent to pharmacy.  

## 2019-01-20 ENCOUNTER — Telehealth: Payer: Self-pay | Admitting: Adult Health

## 2019-01-20 NOTE — Telephone Encounter (Signed)
Pt called wanting to discuss dosage on her pregabalin (LYRICA) 25 MG capsule with RN. Please advise.

## 2019-01-20 NOTE — Telephone Encounter (Signed)
LMVM for pt that returned call.  °

## 2019-01-21 NOTE — Telephone Encounter (Signed)
LMVM for pt that calling again re: her message.

## 2019-01-27 ENCOUNTER — Telehealth: Payer: Self-pay | Admitting: *Deleted

## 2019-01-27 NOTE — Telephone Encounter (Signed)
Pt called states she had been a pt of Dr. Amalia Hailey.

## 2019-01-27 NOTE — Telephone Encounter (Signed)
Left message informing pt I had reviewed her clinicals of 10/2015 and would try to answer her questions to the best of my ability, but I felt she would benefit from an appt with Dr. Amalia Hailey.

## 2019-01-30 NOTE — Telephone Encounter (Signed)
Pt would like to know if Dr Jannifer Franklin would either call in Lyrica for her or if he would be willing to call in the Compound Creme with 15% Ibprofin 1 % Baclofen, 2 % lidocaine and 3% Gabapentin.  Please call

## 2019-01-30 NOTE — Telephone Encounter (Signed)
Spoke to pt and she is asking for increase of pregabalin (now on 25mg  po bid) since it does not last but also to get transdermal therapeutic cream, this was given by Dr, Amalia Hailey (podiatry) and it helped but she thought wanted [place] to prescribe if you would.  Please advise.

## 2019-02-03 ENCOUNTER — Telehealth: Payer: Self-pay | Admitting: Adult Health

## 2019-02-03 NOTE — Telephone Encounter (Signed)
Pt called wanting to know if her pregabalin (LYRICA) 25 MG capsule will be increased since it is working well for her. She also states that the Compound Creme with 15% Ibprofin 1 % Baclofen, 2 % lidocaine and 3% Gabapentin is helping her as well with her legs. Please advise.

## 2019-02-04 ENCOUNTER — Telehealth: Payer: Self-pay

## 2019-02-04 MED ORDER — PREGABALIN 25 MG PO CAPS: 25.0000 mg | ORAL_CAPSULE | Freq: Three times a day (TID) | ORAL | 1 refills | Status: DC

## 2019-02-04 NOTE — Telephone Encounter (Signed)
Ok to send Compound cream in. We can increase lyrica to 50 mg BID or 25 mg TID. Please let me know what the patient prefers and I can send script

## 2019-02-04 NOTE — Telephone Encounter (Signed)
[employer] has been signed by NP and faxed to [employer]. Confirmation fax has been received.

## 2019-02-04 NOTE — Telephone Encounter (Signed)
I called pt and see other phone note.

## 2019-02-04 NOTE — Telephone Encounter (Signed)
Spoke to pt and she will try the lyrica 25mg  po TID and if still needs to increase will use the tablets she has and will let us know.  Compaound cream transdermal therap[employer]etics rx given to MM/NP to fill out.

## 2019-02-04 NOTE — Addendum Note (Signed)
Addended by: Trudie Buckler on: 02/04/2019 12:00 PM   Modules accepted: Orders

## 2019-02-04 NOTE — Addendum Note (Signed)
Addended by: Brandon Melnick on: 02/04/2019 10:28 AM   Modules accepted: Orders

## 2019-02-10 ENCOUNTER — Telehealth: Payer: Self-pay | Admitting: Internal Medicine

## 2019-02-10 ENCOUNTER — Encounter: Payer: Self-pay | Admitting: Internal Medicine

## 2019-02-10 MED ORDER — AMLODIPINE BESYLATE 5 MG PO TABS: 5.0000 mg | ORAL_TABLET | Freq: Every day | ORAL | 0 refills | Status: DC

## 2019-02-10 NOTE — Telephone Encounter (Signed)
*  STAT* If patient is at the pharmacy, call can be transferred to refill team.   1. Which medications need to be refilled? (please list name of each medication and dose if known) amLODipine (NORVASC) 5 MG tablet  2. Which pharmacy/location (including street and city if local pharmacy) is medication to be sent to?[id number] [place], [place] [address]  3. Do they need a 30 day or 90 day supply? 30 day supply  Patient has scheduled an appointment for 02/14/19 at 1:30 PM with Roby Lofts.  Patient states that she took final amLODipine pill today, 02/10/19.

## 2019-02-10 NOTE — Telephone Encounter (Signed)
Rx(s) sent to pharmacy electronically. Needs OV

## 2019-02-10 NOTE — Telephone Encounter (Signed)
Pt c/o medication issue:  1. Name of Medication: amLODipine (NORVASC) 5 MG tablet  2. How are you currently taking this medication (dosage and times per day)? 1 tablet by mouth daily  3. Are you having a reaction (difficulty breathing--STAT)? No  4. What is your medication issue? Patient is calling stating Dr. Debara Pickett only perscribed her 15 tablets of medication and she didn't know if she was to continue taking it after she ran out of medication. If so she needs a refill sent to the pharmacy.

## 2019-02-10 NOTE — Telephone Encounter (Signed)
error 

## 2019-02-10 NOTE — Telephone Encounter (Signed)
We are recommending the COVID-19 vaccine to all of our patients. Cardiac medications (including blood thinners) should not deter anyone from being vaccinated and there is no need to hold any of those medications prior to vaccine administration.     Currently, there is a hotline to call (active 01/31/19) to schedule vaccination appointments as no walk-ins will be accepted.   Number: [phone]    If you have further questions or concerns about the vaccine process, please visit [email or web address] or contact your primary care physician.  I have informed patient of instructions.         

## 2019-02-11 NOTE — Telephone Encounter (Signed)
Rx has been sent to the pharmacy electronically. 01/18

## 2019-02-14 ENCOUNTER — Other Ambulatory Visit: Payer: Self-pay

## 2019-02-14 ENCOUNTER — Encounter: Payer: Self-pay | Admitting: Medical

## 2019-02-14 ENCOUNTER — Ambulatory Visit (INDEPENDENT_AMBULATORY_CARE_PROVIDER_SITE_OTHER): Payer: Medicare Other | Admitting: Medical

## 2019-02-14 VITALS — BP 120/68 | HR 58 | Ht 63.0 in | Wt 114.8 lb

## 2019-02-14 DIAGNOSIS — R400 Somnolence: Secondary | ICD-10-CM | POA: Diagnosis not present

## 2019-02-14 DIAGNOSIS — R5383 Other fatigue: Secondary | ICD-10-CM

## 2019-02-14 DIAGNOSIS — E039 Hypothyroidism, unspecified: Secondary | ICD-10-CM

## 2019-02-14 DIAGNOSIS — E785 Hyperlipidemia, unspecified: Secondary | ICD-10-CM | POA: Diagnosis not present

## 2019-02-14 DIAGNOSIS — I10 Essential (primary) hypertension: Secondary | ICD-10-CM

## 2019-02-14 DIAGNOSIS — Z789 Other specified health status: Secondary | ICD-10-CM

## 2019-02-14 MED ORDER — AMLODIPINE BESYLATE 5 MG PO TABS: 5.0000 mg | ORAL_TABLET | Freq: Every day | ORAL | 1 refills | Status: DC

## 2019-02-14 NOTE — Patient Instructions (Addendum)
Medication Instructions:   START ZETIA 10 MG ONCE A DAY   *If you need a refill on your cardiac medications before your next appointment, please call your pharmacy*  Lab Work: NONE ORDERED  TODAY  If you have labs (blood work) drawn today and your tests are completely normal, you will receive your results only by: [name] MyChart Message (if you have MyChart) OR . A paper copy in the mail If you have any lab test that is abnormal or we need to change your treatment, we will call you to review the results.  Testing/Procedures: Your physician has recommended that you have a sleep study. This test records several body functions during sleep, including: brain activity, eye movement, oxygen and carbon dioxide blood levels, heart rate and rhythm, breathing rate and rhythm, the flow of air through your mouth and nose, snoring, body muscle movements, and chest and belly movement.  Follow-Up: At [facility], you and your health needs are our priority.  As part of our continuing mission to provide you with exceptional heart care, we have created designated Provider Care Teams.  These Care Teams include your primary Cardiologist (physician) and Advanced Practice Providers (APPs -  Physician Assistants and Nurse Practitioners) who all work together to provide you with the care you need, when you need it.  Your next appointment:  NEXT AVAILABLE   The format for your next appointment:   In Person  Provider:   You may see Nicole Casino, MD FOR LIPID MANAGEMENT   Other Instructions:   PLEASE CALL 336 [phone] TO SET UP COVID TEST

## 2019-02-14 NOTE — Progress Notes (Signed)
Cardiology Office Note  Date: 02/14/2019   ID: Nicole [name], Nicole [name] [birth year], MRN [id number]  PCP:  Kelton Pillar, MD  Cardiologist:  Pixie Casino, MD Electrophysiologist:  None   Chief Complaint  Patient presents with  . Follow-up    fatigue    History of Present Illness: Nicole [name] is a 79 y.o. female with PMH of HTN, HLD (intolerant to statins), Hypothyroidism, GERD, and anxiety who presents for routine follow-up.  She was last evaluated by cardiology at an outpatient visit with [name], PA-C 06/2017, at which time she had complaints of fatigue. She was recently seen by her PCP with HR's noted in the 40s-50s which was felt to have possibly contributed to her symptoms and her diltiazem was transitioned to amlodipine for BP control. She was recommended to follow-up in 1 year. Her last ischemic evaluation was a NST in 2000 which was presumably without ischemia.   She presents today for routine follow-up of her HTN/HLD. Unfortunately her husband passed away 2019/10/28. She is still grieving; good family support. She continues to feel significant fatigue during the day. She is concerned about possible sleep apnea. She has difficulty falling asleep, gets up a couple times at night to urinate, and when she wakes around 10am she feels unrested. She has low energy throughout the day but does not find herself nodding off or napping. She is unsure if she snores as she's been sleeping alone for the past 20 years. She denies PND. She has not been exercising as much as she use to. No complaints of chest pain, SOB, DOE, LE edema, orthopnea, PND, dizziness, lightheadedness, syncope, or palpitations.     Past Medical History:  Diagnosis Date  . Anxiety   . Arthritis   . Cancer (HCC)    Basal cell  . GERD (gastroesophageal reflux disease)   . HTN (hypertension)   . Hyperlipemia   . Hypothyroidism   . Increased liver enzymes   . PONV (postoperative nausea and  vomiting)     Past Surgical History:  Procedure Laterality Date  . BREAST BIOPSY Left   . BREAST EXCISIONAL BIOPSY Left   . BREAST SURGERY     reduction  . CARDIOVASCULAR STRESS TEST  05/1998   breast attenuation in anterior septal wall, EF 63%  . CHOLECYSTECTOMY    . FOOT SURGERY    . HIP SURGERY    . REDUCTION MAMMAPLASTY Bilateral   . REVERSE SHOULDER ARTHROPLASTY Left 05/12/2016   Procedure: LEFT REVERSE TOTAL SHOULDER ARTHROPLASTY;  Surgeon: Netta Cedars, MD;  Location: [facility];  Service: Orthopedics;  Laterality: Left;    Current Outpatient Medications  Medication Sig Dispense Refill  . amLODipine (NORVASC) 5 MG tablet Take 1 tablet (5 mg total) by mouth daily. 90 tablet 1  . Calcium Carbonate-Vitamin D (CALCIUM-VITAMIN D3 PO) Take 15 mLs by mouth 2 (two) times daily.    [name] CINNAMON PO Take 1 capsule by mouth 2 (two) times daily.    . clonazePAM (KLONOPIN) 1 MG tablet Take 0.5 mg by mouth 2 (two) times daily as needed for anxiety.    [name] levothyroxine (SYNTHROID) 88 MCG tablet Take 88 mcg by mouth daily.    . meloxicam (MOBIC) 15 MG tablet Take 15 mg by mouth as needed.   3  . MILK THISTLE PO Take 1 capsule by mouth 2 (two) times daily.    . Multiple Vitamin (MULTIVITAMIN WITH MINERALS) TABS tablet Take 1 tablet by mouth daily.    [name]  olmesartan (BENICAR) 40 MG tablet olmesartan 40 mg tablet  TAKE 1 TABLET BY MOUTH EVERY DAY    . Omega-3 Fatty Acids (FISH OIL PO) Take by mouth.    [name] omeprazole (PRILOSEC) 20 MG capsule Take 20 mg by mouth as needed.     Vladimir Faster Glyc-Propyl Glyc PF (SYSTANE ULTRA PF) 0.4-0.3 % SOLN Place 1 drop into both eyes 3 (three) times daily as needed (for dry eyes).    . pregabalin (LYRICA) 25 MG capsule Take 1 capsule (25 mg total) by mouth 3 (three) times daily. 90 capsule 1   No current facility-administered medications for this visit.   Allergies:  Keppra [levetiracetam], Rosuvastatin calcium, Statins, and Lamictal [lamotrigine]   Social History: The  patient  reports that she has never smoked. She has never used smokeless tobacco. She reports current alcohol use of about 2.0 standard drinks of alcohol per week. She reports that she does not use drugs.   Family History: The patient's family history includes Depression in her daughter and mother; Drug abuse in her daughter; Heart attack (age of onset: 58) in her brother.   ROS:  Please see the history of present illness. Otherwise, complete review of systems is positive for none.  All other systems are reviewed and negative.   Physical Exam: VS:  BP 120/68   Pulse (!) 58   Ht 5\' 3"  (1.6 m)   Wt 114 lb 12.8 oz (52.1 kg)   SpO2 97%   BMI 20.34 kg/m , BMI Body mass index is 20.34 kg/m.  Wt Readings from Last 3 Encounters:  02/14/19 114 lb 12.8 oz (52.1 kg)  01/01/18 108 lb (49 kg)  06/28/17 110 lb (49.9 kg)    General: Patient appears comfortable at rest. HEENT: sclera anicteric Neck: carotid bruits Lungs: Clear to auscultation, nonlabored breathing at rest. Cardiac: Regular rate and rhythm, no S3 or significant systolic murmur, no pericardial rub. Abdomen: Soft, nontender, no hepatomegaly, bowel sounds present, no guarding or rebound. Extremities: No pitting edema, distal pulses 2+. Skin: Warm and dry. Musculoskeletal: No kyphosis. Neuropsychiatric: Alert and oriented x3, affect appropriate.  ECG:  In office today: sinus bradycardia with rate 58 bpm, chronic RBBB, non-specific T wave abnormalities in aVL and V2, no STE/D, no significant change from previous.  Recent Labwork: No results found for requested labs within last 8760 hours.     Component Value Date/Time   CHOL 215 (H) 07/15/2014 0905   TRIG 167 (H) 07/15/2014 0905   HDL 43 (L) 07/15/2014 0905   CHOLHDL 5.0 07/15/2014 0905   VLDL 33 07/15/2014 0905   LDLCALC 139 (H) 07/15/2014 0905    Other Studies Reviewed Today:  None  Assessment and Plan:  1. Fatigue: this is her primary complaint. This has been going  on for at least 2 years. Very low suspicion this is an anginal equivalent as she seems to do okay once she get started with activity and has no chest pain, SOB, or DOE complaints. She is worried about possible sleep apnea given that she wakes up feeling unrested and has daytime fatigue. She is also grieving the loss of her husband who she cared for for a couple years prior to his passing. TSH was wnl on labs 12/2018. Likely fatigue is multifactorial: depression, deconditioning, age, insomnia, and possible OSA - Will pursue a home sleep study to r/o OSA - Recommended talking with her PCP about possibly starting antidepressants  - Recommended continued physical activity with a goal of 30  minutes per day.   2. HTN: BP 120/68 today.  - Continue amlodipine - Continue olmesartan  3. HLD: LDL 181. She had prior intolerance to multiple statins in the past (I see pitavastatin in med history but no others) - myalgias and joint pains.  - Will start zetia 10mg  daily at this time - Will reconnect her with Dr. Debara Pickett for further management of her lipids as this is her primary risk factor for CAD going forward and she does have a family history.   4. Hypothyroidism: TSH wnl 12/2018 - Continue levothyroxine.   Medication Adjustments/Labs and Tests Ordered: Current medicines are reviewed at length with the patient today.  Concerns regarding medicines are outlined above.    Patient Instructions  Medication Instructions:   START ZETIA 10 MG ONCE A DAY   *If you need a refill on your cardiac medications before your next appointment, please call your pharmacy*  Lab Work: NONE ORDERED  TODAY  If you have labs (blood work) drawn today and your tests are completely normal, you will receive your results only by: [name] MyChart Message (if you have MyChart) OR . A paper copy in the mail If you have any lab test that is abnormal or we need to change your treatment, we will call you to review the  results.  Testing/Procedures: Your physician has recommended that you have a sleep study. This test records several body functions during sleep, including: brain activity, eye movement, oxygen and carbon dioxide blood levels, heart rate and rhythm, breathing rate and rhythm, the flow of air through your mouth and nose, snoring, body muscle movements, and chest and belly movement.  Follow-Up: At [facility], you and your health needs are our priority.  As part of our continuing mission to provide you with exceptional heart care, we have created designated Provider Care Teams.  These Care Teams include your primary Cardiologist (physician) and Advanced Practice Providers (APPs -  Physician Assistants and Nurse Practitioners) who all work together to provide you with the care you need, when you need it.  Your next appointment:  NEXT AVAILABLE   The format for your next appointment:   In Person  Provider:   You may see Pixie Casino, MD FOR LIPID MANAGEMENT   Other Instructions:   PLEASE [phone] TO SET UP COVID TEST         Signed, Levell July, NP 02/14/2019 4:44 PM    [facility] [place], [place], [place] [address] Phone: [phone]; Fax: [phone]

## 2019-02-18 ENCOUNTER — Other Ambulatory Visit: Payer: Self-pay

## 2019-02-21 ENCOUNTER — Telehealth: Payer: Self-pay | Admitting: *Deleted

## 2019-02-21 ENCOUNTER — Telehealth: Payer: Self-pay | Admitting: Internal Medicine

## 2019-02-21 ENCOUNTER — Other Ambulatory Visit: Payer: Self-pay | Admitting: Medical

## 2019-02-21 DIAGNOSIS — R001 Bradycardia, unspecified: Secondary | ICD-10-CM

## 2019-02-21 DIAGNOSIS — I10 Essential (primary) hypertension: Secondary | ICD-10-CM

## 2019-02-21 DIAGNOSIS — R5383 Other fatigue: Secondary | ICD-10-CM

## 2019-02-21 NOTE — Telephone Encounter (Signed)
Attempted to contact patient to give HST appointment information. Voice mail box is full.

## 2019-02-21 NOTE — Telephone Encounter (Signed)
Will route to MD and nurse to advise if okay for patient to take this.  Thank you!

## 2019-02-21 NOTE — Telephone Encounter (Signed)
New Message    Pt is calling about a medication that her PCP prescribwed her today, Ridoline. She is wondering if it is okay to start take this  She would like for Jenna to call her back     Please call

## 2019-02-21 NOTE — Telephone Encounter (Signed)
Patient is returning phone call.  °

## 2019-02-24 ENCOUNTER — Telehealth: Payer: Self-pay | Admitting: *Deleted

## 2019-02-24 NOTE — Telephone Encounter (Signed)
Attempt #2 to contact and notify patient of HST appointment phone mailbox full unable to leave message.

## 2019-02-25 NOTE — Telephone Encounter (Signed)
Patient notified of [place] appointment.

## 2019-02-27 ENCOUNTER — Ambulatory Visit: Payer: Medicare Other | Admitting: Family Medicine

## 2019-02-27 DIAGNOSIS — Z0289 Encounter for other administrative examinations: Secondary | ICD-10-CM

## 2019-02-27 NOTE — Telephone Encounter (Signed)
I have responded - ok to take Ritalin.  Dr Lemmie Evens

## 2019-02-28 ENCOUNTER — Ambulatory Visit: Payer: Medicare Other | Admitting: Internal Medicine

## 2019-03-18 NOTE — Telephone Encounter (Signed)
Attempted to call the patient back. "mailbox full".

## 2019-03-18 NOTE — Telephone Encounter (Signed)
Patient is returning phone call.  °

## 2019-03-24 ENCOUNTER — Ambulatory Visit: Payer: Medicare Other | Admitting: Internal Medicine

## 2019-03-31 ENCOUNTER — Ambulatory Visit: Payer: Medicare Other | Attending: Internal Medicine

## 2019-03-31 DIAGNOSIS — Z23 Encounter for immunization: Secondary | ICD-10-CM | POA: Insufficient documentation

## 2019-03-31 NOTE — Progress Notes (Signed)
   Covid-19 Vaccination Clinic  Name:  Nicole [name]    MRN: [id number] DOB: 1940/02/13  03/31/2019  [name] was observed post Covid-19 immunization for 15 minutes without incident. She was provided with Vaccine Information Sheet and instruction to access the V-Safe system.   [name] was instructed to call 911 with any severe reactions post vaccine: [name] Difficulty breathing  . Swelling of face and throat  . A fast heartbeat  . A bad rash all over body  . Dizziness and weakness   Immunizations Administered    Name Date Dose VIS Date Route   Pfizer COVID-19 Vaccine 03/31/2019  1:40 PM 0.3 mL 01/03/2019 Intramuscular   Manufacturer: [place]   Lot: [id number]   [place]: [id number]

## 2019-04-04 ENCOUNTER — Other Ambulatory Visit: Payer: Medicare Other

## 2019-04-14 ENCOUNTER — Encounter: Payer: Self-pay | Admitting: Internal Medicine

## 2019-04-15 ENCOUNTER — Telehealth: Payer: Self-pay | Admitting: Adult Health

## 2019-04-15 NOTE — Telephone Encounter (Signed)
Is she is having burning and tingling pain in the feet? If so we can increase to 50 mg 3 times a day. The cold sensation or numbness will not improve with Lyrica

## 2019-04-15 NOTE — Telephone Encounter (Signed)
Called pt.  Pregablin 50mg  po bid is what she is taking.  Helps some.  Compound neuropathy creams helps some.  Feet feel cold on outside and warm inside.  She rubs them.  Has trouble falling asleep, once asleep she stays asleep. See's cardiology for sleep, will be having a home sleep test.  Please advise on lyrica or other recommendation.

## 2019-04-15 NOTE — Telephone Encounter (Signed)
Pt called wanting to discuss her pregabalin (LYRICA) 25 MG capsule Pt states she is still having issues and is still not sleeping well. Please advise.

## 2019-04-16 NOTE — Telephone Encounter (Signed)
I called pt and LMVM for her that if burning tingling sensation in feet, lyrica 50mg  po tid can be tried, the cold senation/numbnes may not necessarily help with the lyrica.  If she has questions to please call me back.

## 2019-04-16 NOTE — Telephone Encounter (Signed)
I spoke to pt and relayed the message per MM/NP to try pregabalin 50mg  po TID.  Woulld not necessarily work on cold sensations that she is having.  Works more for the burning pain, tingling.  She will try and let us know in 2 wks.

## 2019-04-17 ENCOUNTER — Telehealth: Payer: Self-pay | Admitting: Cardiovascular Disease

## 2019-04-17 NOTE — Telephone Encounter (Signed)
Patient states she is calling to discuss instructions for sleep study scheduled for 04/24/19 with Dr. Claiborne Billings. Please return call to discuss.

## 2019-04-22 NOTE — Telephone Encounter (Signed)
Returned a call to the patient. She confirmed the location to pick up her HST.

## 2019-04-24 ENCOUNTER — Other Ambulatory Visit: Payer: Self-pay

## 2019-04-24 ENCOUNTER — Ambulatory Visit (HOSPITAL_BASED_OUTPATIENT_CLINIC_OR_DEPARTMENT_OTHER): Payer: Medicare Other | Attending: Medical | Admitting: Cardiovascular Disease

## 2019-04-24 DIAGNOSIS — R5383 Other fatigue: Secondary | ICD-10-CM | POA: Insufficient documentation

## 2019-04-24 DIAGNOSIS — I10 Essential (primary) hypertension: Secondary | ICD-10-CM

## 2019-04-24 DIAGNOSIS — R0902 Hypoxemia: Secondary | ICD-10-CM | POA: Diagnosis not present

## 2019-04-24 DIAGNOSIS — G4736 Sleep related hypoventilation in conditions classified elsewhere: Secondary | ICD-10-CM

## 2019-04-24 DIAGNOSIS — R0683 Snoring: Secondary | ICD-10-CM | POA: Diagnosis not present

## 2019-04-24 DIAGNOSIS — Z79890 Hormone replacement therapy (postmenopausal): Secondary | ICD-10-CM | POA: Insufficient documentation

## 2019-04-24 DIAGNOSIS — R001 Bradycardia, unspecified: Secondary | ICD-10-CM | POA: Insufficient documentation

## 2019-04-24 DIAGNOSIS — Z79899 Other long term (current) drug therapy: Secondary | ICD-10-CM | POA: Diagnosis not present

## 2019-04-30 ENCOUNTER — Ambulatory Visit: Payer: Medicare Other | Attending: Internal Medicine

## 2019-04-30 DIAGNOSIS — Z23 Encounter for immunization: Secondary | ICD-10-CM

## 2019-04-30 NOTE — Progress Notes (Signed)
   Covid-19 Vaccination Clinic  Name:  Nicole [name]    MRN: [id number] DOB: 07-Jan-1941  04/30/2019  [name] was observed post Covid-19 immunization for 15 minutes without incident. She was provided with Vaccine Information Sheet and instruction to access the V-Safe system.   [name] was instructed to call 911 with any severe reactions post vaccine: [name] Difficulty breathing  . Swelling of face and throat  . A fast heartbeat  . A bad rash all over body  . Dizziness and weakness   Immunizations Administered    Name Date Dose VIS Date Route   Pfizer COVID-19 Vaccine 04/30/2019 12:58 PM 0.3 mL 01/03/2019 Intramuscular   Manufacturer: [place]   Lot: [id number]   [place]: [id number]

## 2019-05-01 ENCOUNTER — Encounter: Payer: Self-pay | Admitting: Internal Medicine

## 2019-05-01 ENCOUNTER — Ambulatory Visit: Payer: Medicare Other | Admitting: Internal Medicine

## 2019-05-01 ENCOUNTER — Other Ambulatory Visit: Payer: Self-pay

## 2019-05-01 VITALS — BP 127/67 | HR 54 | Ht 63.0 in | Wt 118.0 lb

## 2019-05-01 DIAGNOSIS — Z789 Other specified health status: Secondary | ICD-10-CM

## 2019-05-01 DIAGNOSIS — F329 Major depressive disorder, single episode, unspecified: Secondary | ICD-10-CM

## 2019-05-01 DIAGNOSIS — R5383 Other fatigue: Secondary | ICD-10-CM

## 2019-05-01 DIAGNOSIS — F32A Depression, unspecified: Secondary | ICD-10-CM

## 2019-05-01 DIAGNOSIS — E785 Hyperlipidemia, unspecified: Secondary | ICD-10-CM

## 2019-05-01 NOTE — Progress Notes (Signed)
05/01/2019 Karmen Stabs Dalal 10/22/1940 [id number]  CC: Fatigue, grieving  HPI:   Zymirah Trupp is a 79 y.o. female patient of [name], with a PMH below who presents today for a blood pressure check.  I last saw Ms. Joerg about 1 month ago when we added amlodipine 5mg  each day.  Today she has no complaints.  At her last visit she brought her home BP cuff, which was found to be accurate within 10 points.  Since the addition of amlodipine her home readings have dropped significantly, in that now they average in the Q000111Q systolic.  Per patient there was only one reading in the Q000111Q systolic and nothing higher.  She does not use tobacco products or drink alcohol. She drinks minimal caffeine.  She does not add salt to her cooking, although she does eat some prepared foods.  She is going to her local [employer] 3-4 times per week and getting in a good workout of stair stepper and treadmill.  Because of a torn rotator cuff she is not able to do much in the way of weights.    Mrs. Sinquefield returns today for follow-up. She reports she's been significantly fatigued for the past several months and this is unlike her. It appears that her mood is somewhat depressed. She continues to exercise but does not seem to be helping her. She reports having her thyroid checked fairly regularly and maybe her levothyroxine was increased recently to 50 g. Blood pressure has been well controlled. She currently is being treated for sinus infection on amoxicillin. She has had some lightheadedness and dizziness. She never started Livalo due to questions about the cholesterol medicine and liver function. She does have a history of mild abnormalities in her LFTs. This of course was the reason that I recommended that medication at our last office visit. She was also provided with samples which she has no idea where they are at.  Mrs. Wilmott returns today for follow-up. I previously tried her on low-dose Livalo  since she had a history of elevated liver enzymes, but a recheck showed that this also caused her liver enzymes to rise. She was then taken off of this. She's currently asymptomatic. Blood pressure is mildly elevated today.  06/07/2015  Mrs. Tregoning returns today for follow-up. She reports that recently she's been more depressed. She see her therapist but he is apparently got very busy and she's not been able to see him recently. She's not currently on any medicine for depression. She did have some chest discomfort which was attributed to reflux. It seems positional and worse if she laid on one side after eating at night. She's had some relief on omeprazole 20 mg twice a day. We have adjusted her medication including adding chlorthalidone instead of hydrochlorothiazide. This is helped blood pressure and we've discontinued her beta blocker. Heart rate is, per the 40s to the 60s. She reports no change in symptoms.  07/14/2016  Mrs. Highsmith was seen today follow-up. She recently underwent shoulder surgery and required additional surgery and she continues to have slow healing wearing a brace. She's had good control of her hypertension and has been losing some weight. She relates this to her surgery as well as ongoing problems with depression. She seems to be struggling with some relationship problems and is in alcoholic's anonymous. She denies any current alcohol use. She also denies any chest pain or worsening shortness of breath.  05/02/2019  [name] is seen in follow-up  today.  She is fatigued and notably grieving after the loss of her husband who was a patient of mine as well back in September.  In a since then she has had much difficulty sleeping at night.  She had a sleep study however the results are still pending.  Blood pressures well controlled today.  Notably her lipids were quite elevated recently.  She has been intolerant to statins due to myalgias and her total cholesterol was 254,  triglycerides 127, HDL 50 and LDL 181.  She reports this is longstanding and has not been able to get her numbers much lower.  Results are very concerning for possible familial hyperlipidemia.  Current Outpatient Medications  Medication Sig Dispense Refill  . amLODipine (NORVASC) 5 MG tablet Take 1 tablet (5 mg total) by mouth daily. 90 tablet 1  . Calcium Carbonate-Vitamin D (CALCIUM-VITAMIN D3 PO) Take 15 mLs by mouth 2 (two) times daily.    [name] CINNAMON PO Take 1 capsule by mouth 2 (two) times daily.    . clonazePAM (KLONOPIN) 1 MG tablet Take 0.5 mg by mouth 2 (two) times daily as needed for anxiety.    [name] levothyroxine (SYNTHROID) 88 MCG tablet Take 88 mcg by mouth daily.    [name] MILK THISTLE PO Take 1 capsule by mouth 2 (two) times daily.    . Multiple Vitamin (MULTIVITAMIN WITH MINERALS) TABS tablet Take 1 tablet by mouth daily.    [name] olmesartan (BENICAR) 40 MG tablet olmesartan 40 mg tablet  TAKE 1 TABLET BY MOUTH EVERY DAY    . Omega-3 Fatty Acids (FISH OIL PO) Take by mouth.    [name] omeprazole (PRILOSEC) 20 MG capsule Take 20 mg by mouth as needed.     Vladimir Faster Glyc-Propyl Glyc PF (SYSTANE ULTRA PF) 0.4-0.3 % SOLN Place 1 drop into both eyes 3 (three) times daily as needed (for dry eyes).    . pregabalin (LYRICA) 25 MG capsule Take 1 capsule (25 mg total) by mouth 3 (three) times daily. 90 capsule 1   No current facility-administered medications for this visit.    Allergies  Allergen Reactions  . Keppra [Levetiracetam]     Depression  . Rosuvastatin Calcium Diarrhea    Muscle aches  . Statins Other (See Comments)    Muscle aches  . Lamictal [Lamotrigine] Rash    Past Medical History:  Diagnosis Date  . Anxiety   . Arthritis   . Cancer (HCC)    Basal cell  . GERD (gastroesophageal reflux disease)   . HTN (hypertension)   . Hyperlipemia   . Hypothyroidism   . Increased liver enzymes   . PONV (postoperative nausea and vomiting)     Blood pressure 127/67, pulse (!) 54,  height 5\' 3"  (1.6 m), weight 118 lb (53.5 kg), SpO2 98 %.   EXAM: General appearance: alert and Appears depressed Neck: no carotid bruit and no JVD Lungs: clear to auscultation bilaterally Heart: regular rate and rhythm, S1, S2 normal, no murmur, click, rub or gallop Abdomen: soft, non-tender; bowel sounds normal; no masses,  no organomegaly Extremities: Left arm in a sling Pulses: 2+ and symmetric Skin: Skin color, texture, turgor normal. No rashes or lesions Neurologic: Grossly normal Psych: Very depressed, tearful  EKG: Deferred  IMPRESSION: 1. Hypertension-uncontrolled 2. Atypical chest pain 3. Possible familial dyslipidemia with statin intolerance due to nonalcoholic fatty liver disease, elevated LFTs and myalgias 4. GERD  PLAN:   1.  Mrs. Liebmann recently had a sleep study and those  results are pending.  She has had marked dyslipidemia and has been intolerant to statins and has possible familial hyperlipidemia.  Her LDL cholesterol is 181.  She also had liver enzyme abnormalities on statins.  She needs at least 50% reduction in LDL cholesterol and I think would not be able to achieve this without a PCSK9 inhibitor.  We will pursue prior authorization for that and plan follow-up with repeat lipids in 3 to 4 months.  Pixie Casino, MD, [facility], [place] Director of the Advanced Lipid Disorders &  Cardiovascular Risk Reduction Clinic Diplomate of the American Board of Clinical Lipidology Attending Cardiologist  Direct Dial: [phone]  Fax: [phone]  Website:  [email or web address]

## 2019-05-01 NOTE — Patient Instructions (Signed)
Medication Instructions:  Dr. Debara Pickett recommends Repatha 140mg /mL (PCSK9). This is an injectable cholesterol medication self-administered once every 14 days. This medication will need prior approval with your insurance company, which we will work on. If the medication is not approved initially, we may need to do an appeal with your insurance. We will keep you updated on this process.   Administer medication in area of fatty tissue such as abdomen, outer thigh, back up of arm - and rotate site with each injection Store medication in refrigerator until ready to administer - allow to sit at room temp for 30 mins - 1 hour prior to injection Dispose of medication in a SHARPS container - your pharmacy should be able to direct you on this and proper disposal   If you need co-pay assistance, please look into the program at [email or web address] >> disease funds >> hypercholesterolemia. This is an online application or you can call to complete.   If you need a co-pay card for Repatha: [email or web address] >> paying for Repatha If you need a co-pay card for Praluent: WedMap.it >> starting & paying for Praluent  *If you need a refill on your cardiac medications before your next appointment, please call your pharmacy*   Lab Work: FASTING lab work in 3-4 months to check cholesterol   If you have labs (blood work) drawn today and your tests are completely normal, you will receive your results only by: [name] MyChart Message (if you have MyChart) OR . A paper copy in the mail If you have any lab test that is abnormal or we need to change your treatment, we will call you to review the results.   Testing/Procedures: NONE   Follow-Up: At [facility], you and your health needs are our priority.  As part of our continuing mission to provide you with exceptional heart care, we have created designated Provider Care Teams.  These Care Teams include your primary Cardiologist (physician) and Advanced Practice Providers  (APPs -  Physician Assistants and Nurse Practitioners) who all work together to provide you with the care you need, when you need it.  We recommend signing up for the patient portal called "MyChart".  Sign up information is provided on this After Visit Summary.  MyChart is used to connect with patients for Virtual Visits (Telemedicine).  Patients are able to view lab/test results, encounter notes, upcoming appointments, etc.  Non-urgent messages can be sent to your provider as well.   To learn more about what you can do with MyChart, go to [email or web address].    Your next appointment:   3-4 month(s) - lipid clinic  The format for your next appointment:   Either In Person or Virtual  Provider:   K. Mali Hilty, MD   Other Instructions

## 2019-05-02 ENCOUNTER — Encounter: Payer: Self-pay | Admitting: Internal Medicine

## 2019-05-02 ENCOUNTER — Telehealth: Payer: Self-pay | Admitting: Internal Medicine

## 2019-05-02 ENCOUNTER — Telehealth: Payer: Self-pay | Admitting: Cardiovascular Disease

## 2019-05-02 DIAGNOSIS — E785 Hyperlipidemia, unspecified: Secondary | ICD-10-CM

## 2019-05-02 MED ORDER — REPATHA SURECLICK 140 MG/ML ~~LOC~~ SOAJ: 1.0000 | SUBCUTANEOUS | 11 refills | Status: DC

## 2019-05-02 NOTE — Telephone Encounter (Signed)
Patient is calling for her sleep study results.  

## 2019-05-02 NOTE — Telephone Encounter (Signed)
Called and spoke with pt, notified that her sleep study had not been resulted yet and that we would call with the results as soon as we had them. Pt verbalized understanding, no other questions at this time.

## 2019-05-02 NOTE — Telephone Encounter (Signed)
PA for repatha sureclick submitted via CMM (Key: [id number])

## 2019-05-02 NOTE — Telephone Encounter (Addendum)
Request Reference Number: [id number]. REPATHA SURE INJ 140MG /ML is approved through 11/01/2019.   Patient updated via MyChart Rx sent to MyChart

## 2019-05-02 NOTE — Addendum Note (Signed)
Addended by: Fidel Levy on: 05/02/2019 02:44 PM   Modules accepted: Orders

## 2019-05-06 NOTE — Procedures (Signed)
     Patient Name: Nicole [name], Nicole [name] Date: 04/24/2019 Gender: Female D.O.B: 02/01/1940 Age (years): 78 Referring Provider: Roby Lofts PA-C Height (inches): 84 Interpreting Physician: Shelva Majestic MD, ABSM Weight (lbs): 114 RPSGT: Jacolyn Reedy BMI: 20 MRN: [id number] Neck Size: 14.00  CLINICAL INFORMATION Sleep Study Type: HST  Indication for sleep study: N/A  Epworth Sleepiness Score: 0  SLEEP STUDY TECHNIQUE A multi-channel overnight portable sleep study was performed. The channels recorded were: nasal airflow, thoracic respiratory movement, and oxygen saturation with a pulse oximetry. Snoring was also monitored.  MEDICATIONS amLODipine (NORVASC) 5 MG tablet  Calcium Carbonate-Vitamin D (CALCIUM-VITAMIN D3 PO)  CINNAMON PO  clonazePAM (KLONOPIN) 1 MG tablet  Evolocumab (REPATHA SURECLICK) [id number] MG/ML SOAJ  levothyroxine (SYNTHROID) 88 MCG tablet  MILK THISTLE PO  Multiple Vitamin (MULTIVITAMIN WITH MINERALS) TABS tablet  olmesartan (BENICAR) 40 MG tablet  Omega-3 Fatty Acids (FISH OIL PO)  omeprazole (PRILOSEC) 20 MG capsule  Polyethyl Glyc-Propyl Glyc PF (SYSTANE ULTRA PF) 0.4-0.3 % SOLN  pregabalin (LYRICA) 25 MG capsule  Patient self administered medications include: N/A.  SLEEP ARCHITECTURE Patient was studied for 365.2 minutes. The sleep efficiency was 98.8 % and the patient was supine for 6.2%. The arousal index was 0.0 per hour.  RESPIRATORY PARAMETERS The overall AHI was 1.2 per hour, with a central apnea index of 0.0 per hour.  The oxygen nadir was 80% during sleep.  CARDIAC DATA Mean heart rate during sleep was 57.9 bpm.  IMPRESSIONS - No significant obstructive sleep apnea occurred during this study (AHI = 1.2/h). Since this was a home study, REM sleep AHI could not be determined. - No significant central sleep apnea occurred during this study (CAI = 0.0/h). - Significant oxygen desaturation to a nadir of 80%. Time spent below 89%  wass 3.1 minutes. - Patient snored 0.2% during the sleep.  DIAGNOSIS - Normal study - Transient nocturnal hypoxia  RECOMMENDATIONS - There is no indication for CPAP therapy.  - Effort should be made to optimize nasal and oropharyngeal patency. - If transient nocturnal hypoxia persists consider a follow-up overnight oximetry evaluation. - Avoid alcohol, sedatives and other CNS depressants that may worsen sleep apnea and disrupt normal sleep architecture. - Sleep hygiene should be reviewed to assess factors that may improve sleep quality. - Weight management and regular exercise should be initiated or continued.   [Electronically signed] 05/06/2019 09:26 AM  Shelva Majestic MD, [facility], [place], American Board of Sleep Medicine   NPI: [id number] [place] PH: [phone]   FX: [phone] [place]

## 2019-05-06 NOTE — Telephone Encounter (Signed)
Spoke with patient for 10 minutes concerning Repatha, of which she wants to wait on starting. The reason being, she is asking for a cardiac work-up. She wants to know if she has any ASCVD. Advised that a calcium score test may be reasonable, but is not covered with insurance and is $150. Explained that an echo or stress test is ordered based on symptoms, etc and not for screening purposes.   She is also asking about other cholesterol meds, non-statins, non-PCSK9. I am unsure what else she discussed with MD during last visit.   Notified her I will send message to Dr. Debara Pickett, who is [place] this week, and will follow up with her when I get a reply

## 2019-05-06 NOTE — Telephone Encounter (Signed)
LM for patient concerning Repatha approval and that Rx was sent to [facility]

## 2019-05-07 NOTE — Telephone Encounter (Signed)
Patient states she is calling to follow up in regards to sleep study results. Please call.

## 2019-05-07 NOTE — Telephone Encounter (Signed)
Routed to [place] CMA to see if she can assist with results

## 2019-05-08 NOTE — Telephone Encounter (Signed)
Lvm2cb 4/15 

## 2019-05-08 NOTE — Telephone Encounter (Signed)
Follow up:  Patient still has not received the results of her sleep study. Please discuss results with her

## 2019-05-08 NOTE — Telephone Encounter (Signed)
Follow up     Pt is calling back to see about her results    Please advise

## 2019-05-08 NOTE — Telephone Encounter (Signed)
See report;no OSA on home study but decreased O2 sat to 80%

## 2019-05-09 NOTE — Telephone Encounter (Signed)
Patient wishes to proceed with CT calcium score test.  Ordered & message sent to schedulers to arrange.  Advised MD suggested she start on repatha as prescribed, despite CT calcium score test results

## 2019-05-09 NOTE — Telephone Encounter (Signed)
LMTCB

## 2019-05-09 NOTE — Telephone Encounter (Signed)
Ok to order a calcium score if she wants it. Would still recommend therapy.  Dr Lemmie Evens

## 2019-05-09 NOTE — Addendum Note (Signed)
Addended by: Fidel Levy on: 05/09/2019 10:49 AM   Modules accepted: Orders

## 2019-05-09 NOTE — Telephone Encounter (Signed)
Sent mychart message regarding results

## 2019-05-12 ENCOUNTER — Telehealth: Payer: Self-pay | Admitting: Internal Medicine

## 2019-05-12 NOTE — Telephone Encounter (Signed)
Spoke with patient. Results of sleep study reviewed. Patient would like to know recommendations for getting to sleep. Patient is lying in bed for 2-3 hours waiting to fall asleep. She has tried OTC sleep aids and melatonin without any luck. Will route to Dr. Claiborne Billings and primary nurse Minette Brine for review.

## 2019-05-12 NOTE — Telephone Encounter (Signed)
Patient is returning phone call in regards to results. Please advise.

## 2019-05-12 NOTE — Telephone Encounter (Signed)
Left message for patient to call and schedule Calcium Scoring ordered by Dr. Debara Pickett

## 2019-05-13 DIAGNOSIS — M25561 Pain in right knee: Secondary | ICD-10-CM | POA: Insufficient documentation

## 2019-05-13 NOTE — Telephone Encounter (Signed)
I called pt she is having rough time with neuropathy she was on lyrica 25mg  po TID, changed to lyrica 50mg  po BID and this is not really doing much for her.  She has tried gabapentin, duloxetine.  Another alternative, increase dose strength?  I did relay that she could try that foot device if she chose.  We do not have any information on that.  She would not do if we did not recommend.

## 2019-05-13 NOTE — Telephone Encounter (Signed)
I called pt and let her know via VM that MM/NP had left.  I will forward message to Dr. Jannifer Franklin, he may not address until next week, or MM/NO on Monday.  Just wanted you to know.

## 2019-05-13 NOTE — Telephone Encounter (Signed)
Since I have never seen the patient before and only read her home sleep study which did not demonstrate any obstructive sleep apnea but did demonstrate some nocturnal hypoxemia I would suggest she contact her primary care doctor for consideration of potential sleep aid to help with sleep initiation and maintenance.

## 2019-05-13 NOTE — Telephone Encounter (Signed)
Pt has called to report that for a while the increase in the Lyrica was helping but it is no longer helping.  Pt states often at night she sees advertisements for some time of devise(she does not recall the name) its something a person can put their feet on and it is supposed to send currents thru feet and legs to help prevent cramps.  Pt would like to know if this would be a good thing for her to try.  Please call, pt said if it is not a good idea she is open to trying another medication.

## 2019-05-14 MED ORDER — PREGABALIN 50 MG PO CAPS: ORAL_CAPSULE | ORAL | 2 refills | Status: DC

## 2019-05-14 NOTE — Addendum Note (Signed)
Addended by: Kathrynn Ducking on: 05/14/2019 08:00 AM   Modules accepted: Orders

## 2019-05-14 NOTE — Telephone Encounter (Signed)
Reviewed Dr.Kelly's recommendation of discussing potential sleep aid with PCP. Pt verbalized understanding and will contact her PCP. No other questions at this time.

## 2019-05-14 NOTE — Telephone Encounter (Signed)
I called patient, left a message.  The 50 mg twice daily dose of Lyrica is not helping her much, she is having trouble sleeping at night, if this is the case, I would increase the nighttime dose, I will call in a prescription for the 50 mg capsules taking 1 in the morning and 2 in the evening.  I am not sure what device she is talking about regarding the peripheral neuropathy, if she could get the name of this and let us know I will be happy to review.

## 2019-05-15 ENCOUNTER — Telehealth: Payer: Self-pay | Admitting: *Deleted

## 2019-05-15 NOTE — Telephone Encounter (Signed)
-----   Message from Troy Sine, MD sent at 05/06/2019  9:33 AM EDT ----- Mariann Laster, please notify pt of results

## 2019-05-16 ENCOUNTER — Telehealth: Payer: Self-pay | Admitting: *Deleted

## 2019-05-16 ENCOUNTER — Telehealth: Payer: Self-pay | Admitting: Internal Medicine

## 2019-05-16 NOTE — Telephone Encounter (Signed)
Spoke with patient who is not interested in giving herself injection, is also concerned about med cost Will route to MD for alternatives - nexletol?

## 2019-05-16 NOTE — Telephone Encounter (Signed)
-----   Message from Nicole Sine, MD sent at 05/06/2019  9:33 AM EDT ----- Nicole [name], please notify pt of results

## 2019-05-16 NOTE — Telephone Encounter (Signed)
Patient does not feel comfortable about giving herself a shot. She wanted to know if there was another medication that she could try.

## 2019-05-16 NOTE — Telephone Encounter (Signed)
Informed patient of sleep study results and patient understanding was verbalized. Patient understands her sleep study showed No significant obstructive sleep apnea occurred during this study (AHI = 1.2/h).  Pt is aware and agreeable to normal results.

## 2019-05-16 NOTE — Telephone Encounter (Signed)
Pt is aware and agreeable to normal results. Normal study.

## 2019-05-16 NOTE — Telephone Encounter (Signed)
Could she do the Repatha pump? If not .. maybe we could consider nexletol, but that may not be as helpful.  Maybe we could help/watch her administer it.  Dr Lemmie Evens

## 2019-05-19 ENCOUNTER — Telehealth: Payer: Self-pay | Admitting: Internal Medicine

## 2019-05-19 NOTE — Telephone Encounter (Signed)
New Message    Pt is calling and is wanting to know if [place] asked [name] about cholesterol medication for her     Please call back

## 2019-05-19 NOTE — Telephone Encounter (Signed)
No VM available, mailbox full

## 2019-05-20 NOTE — Telephone Encounter (Signed)
Documented in another open encounter

## 2019-05-20 NOTE — Telephone Encounter (Signed)
LM for patient to call back to arrange time to do first repatha injection with [facility] pharmacist Monday May 3 @ 2pm or Friday May 7 @ 2 pm

## 2019-05-20 NOTE — Telephone Encounter (Signed)
Patient aware of MD advice. She is willing to try repatha and would like to do injection in the office with [facility]. Explained that MD said nexletol may not be as effective. Will coordinate a date for patient to come in office

## 2019-05-22 NOTE — Telephone Encounter (Signed)
Phone rep checked office voicemail's,at 1:11 pt left voicemail asking for a call to discuss the changing of her medication.  Pt stated she spoke with RN last week, she is asking to be called at [phone]

## 2019-05-22 NOTE — Telephone Encounter (Signed)
Pt returned call. Please call back when available. 

## 2019-05-22 NOTE — Telephone Encounter (Signed)
LMVM for pt that called back.

## 2019-05-22 NOTE — Telephone Encounter (Signed)
I called pt and had to LM.

## 2019-05-23 ENCOUNTER — Telehealth: Payer: Self-pay | Admitting: Neurology

## 2019-05-23 NOTE — Telephone Encounter (Signed)
She called Friday after hours hoping to speak to Dr. Jannifer Franklin.  The Lyrica is no longer helping her neuropathic pain.  Her dose has been increased from 50 mg twice a day to 50-100 day without much benefit.  I let her know I would forward the message

## 2019-05-26 NOTE — Telephone Encounter (Signed)
This patient has a history of borderline diabetes, EMG nerve conduction study did not show definite neuropathy, she likely has a small fiber neuropathy.  CT of the lumbar spine did not show spinal stenosis.  She has been on multiple medications without benefit or tolerance including gabapentin, carbamazepine, Lamictal, Keppra, and Cymbalta.  She is now on Lyrica.   I called the patient, left a message.  She has had multiple drug intolerances or ineffective treatments, she can go up on the Lyrica if she can tolerate it, she can also start using capsaicin topical ointment, this is likely to be beneficial with a small fiber neuropathy.  She can do this with the Lyrica.  If she wants to try to go up on the Lyrica, she is to contact our office.

## 2019-05-26 NOTE — Telephone Encounter (Signed)
I called pt and she is off lyrica 100mg  po bid as out and she did not want to get refill if possible change of medication.  I will forward to Dr. Jannifer Franklin, may not call back as off this week,  Pt aware of this.

## 2019-05-26 NOTE — Telephone Encounter (Signed)
LMVM for pt to return call.   

## 2019-05-28 NOTE — Telephone Encounter (Signed)
I called the patient back, left a message.  She currently indicates that she is off of Lyrica.  She has been on gabapentin, Cymbalta, Keppra, carbamazepine, Lamictal, she could not afford Vimpat, and she has been on Lyrica.  She got up 200 mg twice daily and apparently stopped the drug.  I have recommended using capsaicin cream.  The patient likely has a small fiber neuropathy, capsaicin cream is a substance P depleter and oftentimes works quite well for small fiber neuropathies.  He has no systemic side effects.  The patient has already been on a compounded cream for her feet without much benefit.

## 2019-06-09 ENCOUNTER — Telehealth: Payer: Self-pay

## 2019-06-09 NOTE — Telephone Encounter (Signed)
Pt calling stating that she would like for Jenna, RN to give her a call back concerning her medication Repatha. Pt would like to know if she could get something other than repatha until she gets out of the donut hole. Please address

## 2019-06-10 NOTE — Telephone Encounter (Signed)
Applied for [employer] for patient. She has been approved. Provided her with ID info to take to pharmacy.   She has 1 pen-injector at home and would like to arrange a time to come in for first injection. I had previously left her a message at end of April to coordinate this but did not hear back.   Will work out a time and notify her.   Fatima Sanger Information [employer] Id [id number] Pre-approval Date 06/10/2019  Patient Name Nicole [name] Received Date 06/10/2019  Fund Hypercholesterolemia - Medicare Access Start Date 05/11/2019  Assistance Type Co-pay End Date 05/09/2020  Is Pharmacy Card? Yes Approval Date 06/10/2019  Status Approved Time to Re-enroll? No  Hold On Payments No    Pharmacy Card Information Pharmacy Card Id [id number] Group [id number]  PCN [id number]  [place] Card Activation Status OK-2019-06-10 17:25:11

## 2019-06-10 NOTE — Telephone Encounter (Signed)
Erasmo Downer said patient can come Friday 5/21 @ 10:30 am or 1:30 pm for first repatha injection  LMTCB

## 2019-06-11 ENCOUNTER — Ambulatory Visit (INDEPENDENT_AMBULATORY_CARE_PROVIDER_SITE_OTHER)
Admission: RE | Admit: 2019-06-11 | Discharge: 2019-06-11 | Disposition: A | Discharge status: Home / Self Care | Payer: Self-pay | Source: Ambulatory Visit | Attending: Internal Medicine | Admitting: Internal Medicine

## 2019-06-11 ENCOUNTER — Other Ambulatory Visit: Payer: Self-pay

## 2019-06-11 DIAGNOSIS — E785 Hyperlipidemia, unspecified: Secondary | ICD-10-CM

## 2019-06-12 ENCOUNTER — Other Ambulatory Visit: Payer: Self-pay | Admitting: *Deleted

## 2019-06-12 DIAGNOSIS — R911 Solitary pulmonary nodule: Secondary | ICD-10-CM

## 2019-06-12 NOTE — Telephone Encounter (Signed)
Patient cannot come in on May 21 Raquel can do teaching on may 24 @ 11:30am

## 2019-06-16 ENCOUNTER — Telehealth: Payer: Self-pay | Admitting: Internal Medicine

## 2019-06-16 ENCOUNTER — Telehealth: Payer: Self-pay | Admitting: Pharmacist

## 2019-06-16 NOTE — Telephone Encounter (Signed)
Patient presents to clinic for Repatha SureClick injection DEMO, but indicated she will prefer a pill or something else other than an injections. Also expressed concerns about medication cost and "donut hole" problems.  I spent 20 minutes reviewing Dr [facility] recommendations with patient and explaining Lipid Panel results to her. Office Repatha samples was used during injection DEMO. (Patient didn't brought her own medication).   Drug name: Repatha SureClick       Strength: 140mg         Qty: 1  [id number]  Exp.Date:7/23  *Patient was able to self administered Repatha today.  Will use medication as prescribed. Will call back if unable to tolerate Repatha to discuss option for Nexlizet* She understands that Repatha/Praluent are the best alternatives at this point to decrease LDL by ~50% and reduce cardiovascular risk.   No immediate reaction to Repatha injection noted.

## 2019-06-16 NOTE — Telephone Encounter (Signed)
Nicole [name] is calling requesting Nicole [name] call Nicole [name] and give them the [employer] numbers so she doesn't have to get them from her and then call the pharmacy back. She states she is tired from a doctor's appointment and would prefer not to receive a callback in regards to it unless that is something she is unable to do. Please advise

## 2019-06-18 NOTE — Telephone Encounter (Signed)
Spoke with [facility] and provided pharmacy card info for [employer] for [place]

## 2019-07-04 ENCOUNTER — Ambulatory Visit
Admission: RE | Admit: 2019-07-04 | Discharge: 2019-07-04 | Disposition: A | Discharge status: Home / Self Care | Payer: Medicare Other | Source: Ambulatory Visit | Attending: Family Medicine | Admitting: Family Medicine

## 2019-07-04 ENCOUNTER — Other Ambulatory Visit: Payer: Self-pay

## 2019-07-04 DIAGNOSIS — M810 Age-related osteoporosis without current pathological fracture: Secondary | ICD-10-CM

## 2019-07-08 ENCOUNTER — Telehealth: Payer: Self-pay | Admitting: Internal Medicine

## 2019-07-08 ENCOUNTER — Other Ambulatory Visit: Payer: Medicare Other

## 2019-07-08 NOTE — Telephone Encounter (Signed)
-----   Message from Harrington Challenger, [place] sent at 07/08/2019  4:15 PM EDT ----- Regarding: Repatha - DR Hilty Patient got Demo on 5/24. Is overdue for 2nd dose. Please call and reinforce instructions for use.  Raquel ----- Message ----- From: Jeronimo Norma Sent: 07/08/2019   2:06 PM EDT To: Roxanne Mins Rodriguez-Guzman, RPH-CPP  Patient called wanting to know when their next Repatha injection is . Please call back at [phone].  Thank you

## 2019-07-08 NOTE — Telephone Encounter (Signed)
LM for patient that she is due for second dose of Repatha - which is self-administered once every 14 days.

## 2019-07-15 ENCOUNTER — Telehealth: Payer: Self-pay | Admitting: Internal Medicine

## 2019-07-15 NOTE — Telephone Encounter (Signed)
Patient is calling because she is having very low energy level.  She is trying to rule out physical vs. Mental.

## 2019-07-15 NOTE — Telephone Encounter (Signed)
Spoke to patient .  She states she has no energry , no chest pain . She states she has seen a counselor - who will send her to have NP for follow up\ recommend patient to contact primary for evaluation this matter does not seem cardiac in nature. If primary suspect  Cardiac- office will be notified.  patient verbalized understanding

## 2019-07-24 ENCOUNTER — Telehealth: Payer: Self-pay | Admitting: Internal Medicine

## 2019-07-24 NOTE — Telephone Encounter (Signed)
Pt had some questions for the Pharm D team about her Repatha

## 2019-07-24 NOTE — Telephone Encounter (Signed)
Patient has questions regarding her Evolocumab (REPATHA SURECLICK) 075 MG/ML SOAJ, please advise.

## 2019-07-25 NOTE — Telephone Encounter (Signed)
Returned call to patient.  She had questions about injection technique,  All questions answered.  She was also asking if any flu bugs were going around, as she has been feeling achy and tired for several days.  Stated her PCP is currently out of town, but suggested that if not feeling better in a day or two to to to [facility] for follow up.   Patient voiced understanding.

## 2019-08-04 ENCOUNTER — Ambulatory Visit: Payer: Medicare Other | Admitting: Podiatry

## 2019-08-04 ENCOUNTER — Ambulatory Visit (INDEPENDENT_AMBULATORY_CARE_PROVIDER_SITE_OTHER): Payer: Medicare Other

## 2019-08-04 ENCOUNTER — Encounter: Payer: Self-pay | Admitting: Podiatry

## 2019-08-04 ENCOUNTER — Other Ambulatory Visit: Payer: Self-pay

## 2019-08-04 DIAGNOSIS — M2041 Other hammer toe(s) (acquired), right foot: Secondary | ICD-10-CM

## 2019-08-04 DIAGNOSIS — G609 Hereditary and idiopathic neuropathy, unspecified: Secondary | ICD-10-CM

## 2019-08-04 DIAGNOSIS — G629 Polyneuropathy, unspecified: Secondary | ICD-10-CM | POA: Diagnosis not present

## 2019-08-04 NOTE — Progress Notes (Signed)
   HPI: 79 y.o. female presenting today as a new patient for evaluation of neuropathy to the bilateral feet that has been going on for the last 1-2 years.  Patient states she was diagnosed approximately 2 years ago by her primary care physician and has had thorough evaluation and work-up with neurology.  She was diagnosed with idiopathic peripheral polyneuropathy.  She has tried prescription gabapentin, Lyrica, and Cymbalta without any significant alleviation of symptoms.  Currently she is applying caspaicin topical nightly with minimal relief.  She presents for further treatment and evaluation  Past Medical History:  Diagnosis Date  . Anxiety   . Arthritis   . Cancer (HCC)    Basal cell  . GERD (gastroesophageal reflux disease)   . HTN (hypertension)   . Hyperlipemia   . Hypothyroidism   . Increased liver enzymes   . PONV (postoperative nausea and vomiting)      Physical Exam: General: The patient is alert and oriented x3 in no acute distress.  Dermatology: Skin is warm, dry and supple bilateral lower extremities. Negative for open lesions or macerations.  Vascular: Palpable pedal pulses bilaterally. No edema or erythema noted. Capillary refill within normal limits.  Neurological: Epicritic and protective threshold diminished bilaterally.   Musculoskeletal Exam: Range of motion within normal limits to all pedal and ankle joints bilateral. Muscle strength 5/5 in all groups bilateral.  There is slight crossover deformity of the right first and second toe.  The second toe deviates and sits on top of the hallux during weightbearing.  Assessment: 1.  Peripheral polyneuropathy, idiopathic bilateral   Plan of Care:  1. Patient evaluated. 2.  Prescription for neuropathic pain cream sent to [facility] in [place] 3.  Unfortunately I explained to the patient there is not any other further modalities I can provide that would offer any sort of relief for the patient.  We did  discuss possible physical therapy [facility] however I do not feel that this would provide any sort of relief 4.  Continue wearing good supportive shoes 5.  Return to clinic as needed      Edrick Kins, DPM [facility]  Dr. Edrick Kins, DPM    [address], [place] [address]                Office [phone]  Fax [phone]

## 2019-08-06 ENCOUNTER — Telehealth: Payer: Self-pay | Admitting: Podiatry

## 2019-08-06 ENCOUNTER — Other Ambulatory Visit: Payer: Self-pay | Admitting: Medical

## 2019-08-06 NOTE — Telephone Encounter (Signed)
Pt was seen in office yesterday and was supposed to have a prescription sent to [facility] but states they have not received it. Pt calling to follow up.   Please give patient a call.

## 2019-08-06 NOTE — Telephone Encounter (Signed)
Pt requesting medication be sent to [facility] please assist

## 2019-08-07 ENCOUNTER — Telehealth: Payer: Self-pay | Admitting: Internal Medicine

## 2019-08-07 ENCOUNTER — Telehealth: Payer: Self-pay | Admitting: Podiatry

## 2019-08-07 DIAGNOSIS — R5383 Other fatigue: Secondary | ICD-10-CM

## 2019-08-07 MED ORDER — NONFORMULARY OR COMPOUNDED ITEM: 5 refills | Status: DC

## 2019-08-07 NOTE — Addendum Note (Signed)
Addended by: Harriett Sine D on: 08/07/2019 03:50 PM   Modules accepted: Orders

## 2019-08-07 NOTE — Telephone Encounter (Signed)
Left message for patient, order placed for vit d level.

## 2019-08-07 NOTE — Telephone Encounter (Signed)
Faxed [facility] Peripheral Neuropathy cream orders.

## 2019-08-07 NOTE — Telephone Encounter (Signed)
Ok to check Vit D 25,OH.  Dr Lemmie Evens

## 2019-08-07 NOTE — Telephone Encounter (Signed)
Left message for patient will forward to dr hilty for the okay to order vit d labs.

## 2019-08-07 NOTE — Telephone Encounter (Signed)
Pt called wanting to know ig the compound has been called in or can she get it called in to pharmacy in [place] instead to a pharmacys that do compounds please assist this ids the 4th call from pt in 2days please assist

## 2019-08-07 NOTE — Telephone Encounter (Signed)
New Message:     Pt said she would like to have her Vitamin D checked when she have her lab work please.

## 2019-08-08 ENCOUNTER — Other Ambulatory Visit: Payer: Self-pay

## 2019-08-08 NOTE — Telephone Encounter (Signed)
I don't know of any other compounding pharmacies here in [place]. Not sure what you need assistance with. I sent a prescription to [facility] in [place] for neuropathic pain cream. Compounding creams only come from certain specialty pharmacies. - Dr. Amalia Hailey

## 2019-08-09 LAB — LIPID PANEL
Chol/HDL Ratio: 3.1 ratio (ref 0.0–4.4)
Cholesterol, Total: 170 mg/dL (ref 100–199)
HDL: 54 mg/dL (ref >39)
LDL Chol Calc (NIH): 94 mg/dL (ref 0–99)
Triglycerides: 124 mg/dL (ref 0–149)
VLDL Cholesterol Cal: 22 mg/dL (ref 5–40)

## 2019-08-09 LAB — VITAMIN D 25 HYDROXY (VIT D DEFICIENCY, FRACTURES): Vit D, 25-Hydroxy: 48.2 ng/mL (ref 30.0–100.0)

## 2019-08-11 NOTE — Telephone Encounter (Signed)
I spoke with pt and informed that the [facility] would call with coverage and delivery information. Pt states she had already received it but thought she would get it faster if in town.

## 2019-08-12 ENCOUNTER — Telehealth: Payer: Self-pay | Admitting: Internal Medicine

## 2019-08-12 NOTE — Telephone Encounter (Signed)
Patient is requesting to discuss results from lab work completed on 08/08/19. Please call.

## 2019-08-12 NOTE — Telephone Encounter (Signed)
Called patient.  Advised of lab work. She did mention that she was always very tired still- she thought it was her VitD level but it was normal, so she is unsure why she is so tired. She did say that she has a hard time going to sleep and staying asleep- I did recommend that she notify her PCP as they may could offer her sleep aids.   Patient verbalized understanding, will route to MD for any other recommendations. Thanks!

## 2019-08-18 ENCOUNTER — Encounter: Payer: Self-pay | Admitting: Internal Medicine

## 2019-08-18 ENCOUNTER — Other Ambulatory Visit: Payer: Self-pay

## 2019-08-18 ENCOUNTER — Ambulatory Visit: Payer: Medicare Other | Admitting: Internal Medicine

## 2019-08-18 VITALS — BP 131/71 | HR 78 | Ht 63.0 in | Wt 108.0 lb

## 2019-08-18 DIAGNOSIS — T466X5A Adverse effect of antihyperlipidemic and antiarteriosclerotic drugs, initial encounter: Secondary | ICD-10-CM | POA: Diagnosis not present

## 2019-08-18 DIAGNOSIS — R931 Abnormal findings on diagnostic imaging of heart and coronary circulation: Secondary | ICD-10-CM

## 2019-08-18 DIAGNOSIS — M791 Myalgia, unspecified site: Secondary | ICD-10-CM | POA: Diagnosis not present

## 2019-08-18 DIAGNOSIS — E785 Hyperlipidemia, unspecified: Secondary | ICD-10-CM

## 2019-08-18 NOTE — Patient Instructions (Signed)
Medication Instructions:  No Changes *If you need a refill on your cardiac medications before your next appointment, please call your pharmacy*   Lab Work: Lipid Panel in 6 Months If you have labs (blood work) drawn today and your tests are completely normal, you will receive your results only by: [name] MyChart Message (if you have MyChart) OR . A paper copy in the mail If you have any lab test that is abnormal or we need to change your treatment, we will call you to review the results.   Testing/Procedures: None Ordered   Follow-Up: At [facility], you and your health needs are our priority.  As part of our continuing mission to provide you with exceptional heart care, we have created designated Provider Care Teams.  These Care Teams include your primary Cardiologist (physician) and Advanced Practice Providers (APPs -  Physician Assistants and Nurse Practitioners) who all work together to provide you with the care you need, when you need it.  We recommend signing up for the patient portal called "MyChart".  Sign up information is provided on this After Visit Summary.  MyChart is used to connect with patients for Virtual Visits (Telemedicine).  Patients are able to view lab/test results, encounter notes, upcoming appointments, etc.  Non-urgent messages can be sent to your provider as well.   To learn more about what you can do with MyChart, go to [email or web address].    Your next appointment:   6 month(s)  The format for your next appointment:   In Person  Provider:   K. Mali Hilty, MD   Other Instructions

## 2019-08-18 NOTE — Progress Notes (Signed)
08/18/2019 Nicole [name] [birth year] [id number]  CC: Fatigue, grieving  HPI:   Nicole [name] is a 79 y.o. female patient of [name], with a PMH below who presents today for a blood pressure check.  I last saw Nicole [name] about 1 month ago when we added amlodipine 5mg  each day.  Today she has no complaints.  At her last visit she brought her home BP cuff, which was found to be accurate within 10 points.  Since the addition of amlodipine her home readings have dropped significantly, in that now they average in the 638V systolic.  Per patient there was only one reading in the 564P systolic and nothing higher.  She does not use tobacco products or drink alcohol. She drinks minimal caffeine.  She does not add salt to her cooking, although she does eat some prepared foods.  She is going to her local [employer] 3-4 times per week and getting in a good workout of stair stepper and treadmill.  Because of a torn rotator cuff she is not able to do much in the way of weights.    Nicole [name] returns today for follow-up. She reports she's been significantly fatigued for the past several months and this is unlike her. It appears that her mood is somewhat depressed. She continues to exercise but does not seem to be helping her. She reports having her thyroid checked fairly regularly and maybe her levothyroxine was increased recently to 50 g. Blood pressure has been well controlled. She currently is being treated for sinus infection on amoxicillin. She has had some lightheadedness and dizziness. She never started Livalo due to questions about the cholesterol medicine and liver function. She does have a history of mild abnormalities in her LFTs. This of course was the reason that I recommended that medication at our last office visit. She was also provided with samples which she has no idea where they are at.  [name] returns today for follow-up. I previously tried her on low-dose Livalo  since she had a history of elevated liver enzymes, but a recheck showed that this also caused her liver enzymes to rise. She was then taken off of this. She's currently asymptomatic. Blood pressure is mildly elevated today.  06/07/2015  Nicole [name] returns today for follow-up. She reports that recently she's been more depressed. She see her therapist but he is apparently got very busy and she's not been able to see him recently. She's not currently on any medicine for depression. She did have some chest discomfort which was attributed to reflux. It seems positional and worse if she laid on one side after eating at night. She's had some relief on omeprazole 20 mg twice a day. We have adjusted her medication including adding chlorthalidone instead of hydrochlorothiazide. This is helped blood pressure and we've discontinued her beta blocker. Heart rate is, per the 40s to the 60s. She reports no change in symptoms.  07/14/2016  Nicole [name] was seen today follow-up. She recently underwent shoulder surgery and required additional surgery and she continues to have slow healing wearing a brace. She's had good control of her hypertension and has been losing some weight. She relates this to her surgery as well as ongoing problems with depression. She seems to be struggling with some relationship problems and is in alcoholic's anonymous. She denies any current alcohol use. She also denies any chest pain or worsening shortness of breath.  05/02/2019  Nicole [name] is seen in follow-up  today.  She is fatigued and notably grieving after the loss of her husband who was a patient of mine as well back in September.  In a since then she has had much difficulty sleeping at night.  She had a sleep study however the results are still pending.  Blood pressures well controlled today.  Notably her lipids were quite elevated recently.  She has been intolerant to statins due to myalgias and her total cholesterol was 254,  triglycerides 127, HDL 50 and LDL 181.  She reports this is longstanding and has not been able to get her numbers much lower.  Results are very concerning for possible familial hyperlipidemia.  08/18/2019  I had the pleasure to see Nicole [name] back today.  She continues to grieve over the loss of her husband.  She has a counseling appointment tomorrow.  She has responded very well to [place].  Total cholesterol now is 170, triglycerides 124, HDL 54 and LDL of 94, down from 181.  Current Outpatient Medications  Medication Sig Dispense Refill  . amLODipine (NORVASC) 5 MG tablet TAKE 1 TABLET BY MOUTH EVERY DAY 90 tablet 2  . Calcium Carbonate-Vitamin D (CALCIUM-VITAMIN D3 PO) Take 15 mLs by mouth 2 (two) times daily.    [name] CINNAMON PO Take 1 capsule by mouth 2 (two) times daily.    . clonazePAM (KLONOPIN) 1 MG tablet Take 0.5 mg by mouth 2 (two) times daily as needed for anxiety.    . Evolocumab (REPATHA SURECLICK) 694 MG/ML SOAJ Inject 1 Dose into the skin every 14 (fourteen) days. 2 pen 11  . levothyroxine (SYNTHROID) 88 MCG tablet Take 88 mcg by mouth daily.    [name] MILK THISTLE PO Take 1 capsule by mouth 2 (two) times daily.    . Multiple Vitamin (MULTIVITAMIN WITH MINERALS) TABS tablet Take 1 tablet by mouth daily.    . NONFORMULARY OR COMPOUNDED ITEM [facility]:  Peripheral Neuropathy cream - Bupivacaine 1%, Doxepin 3%, Gabapentin 6%, Pentoxifylline 3%, Topiramate 1%, apply 1-2 grams to affected area 3-4 times daily. 100 each 5  . olmesartan (BENICAR) 40 MG tablet olmesartan 40 mg tablet  TAKE 1 TABLET BY MOUTH EVERY DAY    . Omega-3 Fatty Acids (FISH OIL PO) Take by mouth.    [name] omeprazole (PRILOSEC) 20 MG capsule Take 20 mg by mouth as needed.     Nicole Faster Glyc-Propyl Glyc PF (SYSTANE ULTRA PF) 0.4-0.3 % SOLN Place 1 drop into both eyes 3 (three) times daily as needed (for dry eyes).     No current facility-administered medications for this visit.    Allergies  Allergen  Reactions  . Keppra [Levetiracetam]     Depression  . Rosuvastatin Calcium Diarrhea    Muscle aches  . Statins Other (See Comments)    Muscle aches  . Lamictal [Lamotrigine] Rash    Past Medical History:  Diagnosis Date  . Anxiety   . Arthritis   . Cancer (HCC)    Basal cell  . GERD (gastroesophageal reflux disease)   . HTN (hypertension)   . Hyperlipemia   . Hypothyroidism   . Increased liver enzymes   . PONV (postoperative nausea and vomiting)     Blood pressure (!) 131/71, pulse 78, height 5\' 3"  (1.6 m), weight 108 lb (49 kg), SpO2 99 %.   EXAM: Deferred  EKG: Deferred  IMPRESSION: 1. Hypertension-uncontrolled 2. Atypical chest pain 3. Possible familial dyslipidemia with statin intolerance due to nonalcoholic fatty liver disease, elevated LFTs  and myalgias 4. GERD  PLAN:   1.  Nicole [name] has had an excellent response to Repatha with marked reduction in LDL cholesterol by at least 50% after 3 shots.  It may actually go lower.  We recently checked a vitamin D level which was normal at 48.  She continues to have issues with grieving which is understandable after the loss of her husband.  I am pleased to see that she sought out counseling.  We will plan a follow-up CT chest for nodule seen on her calcium score in November.  She is asymptomatic with that.  Follow-up with me in 6 months after repeat lipids.  Pixie Casino, MD, [facility], [place] Director of the Advanced Lipid Disorders &  Cardiovascular Risk Reduction Clinic Diplomate of the American Board of Clinical Lipidology Attending Cardiologist  Direct Dial: [phone]  Fax: [phone]  Website:  [email or web address]

## 2019-10-03 ENCOUNTER — Other Ambulatory Visit: Payer: Self-pay | Admitting: Family Medicine

## 2019-10-03 DIAGNOSIS — Z1231 Encounter for screening mammogram for malignant neoplasm of breast: Secondary | ICD-10-CM

## 2019-10-06 ENCOUNTER — Telehealth: Payer: Self-pay | Admitting: Internal Medicine

## 2019-10-06 NOTE — Telephone Encounter (Signed)
Request Reference Number: [id number]. REPATHA SURE INJ 140MG /ML is approved through 12/31/202

## 2019-10-06 NOTE — Telephone Encounter (Signed)
PA for Repatha re-authorization submitted via CMM (Key: [id number]

## 2019-10-10 ENCOUNTER — Telehealth: Payer: Self-pay | Admitting: Internal Medicine

## 2019-10-10 NOTE — Telephone Encounter (Signed)
Called and left message for patient to return call. Patient advised to call to make appointment, also mentioned that she might need a referral from her PCP.

## 2019-10-14 NOTE — Telephone Encounter (Signed)
Lmtcb for pt.   CY is only seeing sleep consults at this time. Will need to schedule with another pulmonologist.

## 2019-10-15 NOTE — Telephone Encounter (Signed)
Routing this to front desk pool so they can help [place] getting pt scheduled for a consult appt.

## 2019-10-16 ENCOUNTER — Other Ambulatory Visit: Payer: Self-pay

## 2019-10-16 ENCOUNTER — Ambulatory Visit
Admission: RE | Admit: 2019-10-16 | Discharge: 2019-10-16 | Disposition: A | Discharge status: Home / Self Care | Payer: Medicare Other | Source: Ambulatory Visit | Attending: Family Medicine | Admitting: Family Medicine

## 2019-10-16 DIAGNOSIS — Z1231 Encounter for screening mammogram for malignant neoplasm of breast: Secondary | ICD-10-CM

## 2019-10-16 NOTE — Telephone Encounter (Signed)
Called and spoke with pt letting her know that Dr. Annamaria Boots said he would see her for a consult. Discussed with pt upcoming appt times and she asked to be scheduled for the consult with Dr. Annamaria Boots 10/20/19 at 4:30.  Patrice, since this is a consult, can you please help schedule the appt in case there are any records in proficient that might be able to be printed out. I did discuss with pt which CT scan Dr. Annamaria Boots would be reviewing with her and it is the CT from 06/11/19 that we are able to see in her chart in epic.

## 2019-10-16 NOTE — Telephone Encounter (Signed)
I would be happy to see Nicole [name]. Ok to use a held spot.

## 2019-10-16 NOTE — Telephone Encounter (Signed)
Patient calling again - wanting to see Dr. Annamaria Boots - will CY see this patient - abnormal cxr -she can be reached at [phone]

## 2019-10-16 NOTE — Telephone Encounter (Signed)
Did any clinical staff message talk with Dr. Annamaria Boots to see if he will be willing to see this patient? -pr

## 2019-10-16 NOTE — Telephone Encounter (Signed)
Dr. Annamaria Boots, please advise if you are okay with [place] scheduling pt for a consult with you. Pt states that her husband is a current pt of yours.

## 2019-10-17 NOTE — Telephone Encounter (Signed)
Patient scheduled with Dr. Annamaria Boots - no referral or records in [facility]-pr

## 2019-10-20 ENCOUNTER — Institutional Professional Consult (permissible substitution): Payer: Medicare Other | Admitting: Internal Medicine

## 2019-10-20 NOTE — Progress Notes (Deleted)
10/20/19- 78 yoF never smoker,  Medical problem list includes HTN, Fatty Liver, GERD, Depression, Dyslipidemia, Bradycardia,    Cardiac CT scoring 06/11/19- radiology over -read: IMPRESSION: 1. Nodularity, volume loss, and mild bronchiectasis within the lingula. 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 The larger nodules are technically indeterminate and warrant follow-up of : Non-contrast chest CT at 6-12 months is recommended. If the nodule is stable at time of repeat CT, then future CT at 18-24 months (from today's scan) is considered optional for low-risk patients, but is recommended for high-risk patients. This recommendation follows the consensus statement: Guidelines for Management of Incidental Pulmonary Nodules Detected on CT Images: From the Fleischner Society 2017; Radiology 2017; 284:228-243. 2.  Aortic Atherosclerosis (ICD10-I70.0).

## 2019-11-26 NOTE — Progress Notes (Signed)
11/27/19- 38 yoF never smoker, widow of my patient Tanayah Squitieri who died with complications of thymic cancer, esophageal cancer, aspiration pneumonia, chronic lung disease. Medical problem list includes- HTN, GERD, Hepatic Steatosis, Hypothyroid, PONV, Arthritis, Hyperlipidemia,  Covid vax- 3 Phizer Flu vax- had Pneumonia vax-  Now requests my evaluation and treatment. Screening low dose CT for lung nodule surveillance scheduled 12/12/19 by [name]. Who has worked with her for hyperlipidemia.. Images described below were reviewed and discussed with her. Some hx of seasonal acute bronchitis in the past but otherwise without hx lung disease. She denies cough or night sweats. No hx TB exposure and remote PPDs were negative.  Cardiac CT 06/11/19-  IMPRESSION: 1. Nodularity, volume loss, and mild bronchiectasis within the lingula. 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 The larger nodules are technically indeterminate and warrant follow-up of : Non-contrast chest CT at 6-12 months is recommended. If the nodule is stable at time of repeat CT, then future CT at 18-24 months (from today's scan) is considered optional for low-risk patients, but is recommended for high-risk patients. This recommendation follows the consensus statement: Guidelines for Management of Incidental Pulmonary Nodules Detected on CT Images: From the Fleischner Society 2017; Radiology 2017; 284:228-243. 2.  Aortic Atherosclerosis (ICD10-I70.0).  Prior to Admission medications   Medication Sig Start Date End Date Taking? Authorizing Provider  amLODipine (NORVASC) 5 MG tablet TAKE 1 TABLET BY MOUTH EVERY DAY 08/06/19  Yes Hilty, Nadean Corwin, MD  Calcium Carbonate-Vitamin D (CALCIUM-VITAMIN D3 PO) Take 15 mLs by mouth 2 (two) times daily.   Yes [provider]  CINNAMON PO Take 1 capsule by mouth 2 (two) times daily.   Yes [provider]  clonazePAM (KLONOPIN)  1 MG tablet Take 0.5 mg by mouth 2 (two) times daily as needed for anxiety.   Yes [provider]  Evolocumab (REPATHA SURECLICK) 696 MG/ML SOAJ Inject 1 Dose into the skin every 14 (fourteen) days. 05/02/19  Yes Hilty, Nadean Corwin, MD  levothyroxine (SYNTHROID) 88 MCG tablet Take 88 mcg by mouth daily. 01/06/19  Yes [provider]  MILK THISTLE PO Take 1 capsule by mouth 2 (two) times daily.   Yes [provider]  Multiple Vitamin (MULTIVITAMIN WITH MINERALS) TABS tablet Take 1 tablet by mouth daily.   Yes [provider]  NONFORMULARY OR COMPOUNDED ITEM [facility]:  Peripheral Neuropathy cream - Bupivacaine 1%, Doxepin 3%, Gabapentin 6%, Pentoxifylline 3%, Topiramate 1%, apply 1-2 grams to affected area 3-4 times daily. 08/07/19  Yes Edrick Kins, DPM  olmesartan (BENICAR) 40 MG tablet olmesartan 40 mg tablet  TAKE 1 TABLET BY MOUTH EVERY DAY   Yes [provider]  Omega-3 Fatty Acids (FISH OIL PO) Take by mouth.   Yes [provider]  omeprazole (PRILOSEC) 20 MG capsule Take 20 mg by mouth as needed.    Yes [provider]  Polyethyl Glyc-Propyl Glyc PF (SYSTANE ULTRA PF) 0.4-0.3 % SOLN Place 1 drop into both eyes 3 (three) times daily as needed (for dry eyes).   Yes [provider]   Past Medical History:  Diagnosis Date  . Anxiety   . Arthritis   . Cancer (HCC)    Basal cell  . GERD (gastroesophageal reflux disease)   . HTN (hypertension)   . Hyperlipemia   . Hypothyroidism   . Increased liver enzymes   . PONV (postoperative nausea and vomiting)    Past Surgical History:  Procedure  Laterality Date  . BREAST BIOPSY Left   . BREAST EXCISIONAL BIOPSY Left   . BREAST SURGERY     reduction  . CARDIOVASCULAR STRESS TEST  05/1998   breast attenuation in anterior septal wall, EF 63%  . CHOLECYSTECTOMY    . FOOT SURGERY    . HIP SURGERY    . REDUCTION MAMMAPLASTY Bilateral   . REVERSE SHOULDER ARTHROPLASTY  Left 05/12/2016   Procedure: LEFT REVERSE TOTAL SHOULDER ARTHROPLASTY;  Surgeon: Netta Cedars, MD;  Location: [place];  Service: Orthopedics;  Laterality: Left;   Family History  Problem Relation Age of Onset  . Depression Mother        stroke  . Drug abuse Daughter   . Depression Daughter   . Heart attack Brother 58   Social History   Socioeconomic History  . Marital status: Married    Spouse name: Not on file  . Number of children: 2  . Years of education: 66  . Highest education level: Not on file  Occupational History  . Occupation: Scientist, clinical (histocompatibility and immunogenetics): [employer]  Tobacco Use  . Smoking status: Never Smoker  . Smokeless tobacco: Never Used  Vaping Use  . Vaping Use: Never used  Substance and Sexual Activity  . Alcohol use: Yes    Alcohol/week: 2.0 standard drinks    Types: 2 Glasses of wine per week    Comment: occasionally  . Drug use: No  . Sexual activity: Never  Other Topics Concern  . Not on file  Social History Narrative   Lives at home with husband   Right handed    1 cup of Caffeine daily     Social Determinants of Health   Financial Resource Strain:   . Difficulty of Paying Living Expenses: Not on file  Food Insecurity:   . Worried About Charity fundraiser in the Last Year: Not on file  . Ran Out of Food in the Last Year: Not on file  Transportation Needs:   . Lack of Transportation (Medical): Not on file  . Lack of Transportation (Non-Medical): Not on file  Physical Activity:   . Days of Exercise per Week: Not on file  . Minutes of Exercise per Session: Not on file  Stress:   . Feeling of Stress : Not on file  Social Connections:   . Frequency of Communication with Friends and Family: Not on file  . Frequency of Social Gatherings with Friends and Family: Not on file  . Attends Religious Services: Not on file  . Active Member of Clubs or Organizations: Not on file  . Attends Archivist Meetings: Not on file  . Marital Status: Not on file   Intimate Partner Violence:   . Fear of Current or Ex-Partner: Not on file  . Emotionally Abused: Not on file  . Physically Abused: Not on file  . Sexually Abused: Not on file   ROS-see HPI   + = positive Constitutional:    weight loss, night sweats, fevers, chills, fatigue, lassitude. HEENT:    headaches, difficulty swallowing, tooth/dental problems, sore throat,       sneezing, itching, ear ache, nasal congestion, post nasal drip, snoring CV:    chest pain, orthopnea, PND, swelling in lower extremities, anasarca,                                  dizziness, palpitations Resp:  shortness of breath with exertion or at rest.                productive cough,   non-productive cough, coughing up of blood.              change in color of mucus.  wheezing.   Skin:    rash or lesions. GI:  No-   heartburn, indigestion, abdominal pain, nausea, vomiting, diarrhea,                 change in bowel habits, loss of appetite GU: dysuria, change in color of urine, no urgency or frequency.   flank pain. MS:   joint pain, stiffness, decreased range of motion, back pain. Neuro-     nothing unusual Psych:  change in mood or affect.  depression or anxiety.   memory loss.  OBJ- Physical Exam General- Alert, Oriented, Affect-appropriate, Distress- none acute Skin- rash-none, lesions- none, excoriation- none Lymphadenopathy- none Head- atraumatic            Eyes- Gross vision intact, PERRLA, conjunctivae and secretions clear            Ears- Hearing, canals-normal            Nose- Clear, no-Septal dev, mucus, polyps, erosion, perforation             Throat- Mallampati II , mucosa clear , drainage- none, tonsils- atrophic Neck- flexible , trachea midline, no stridor , thyroid nl, carotid no bruit Chest - symmetrical excursion , unlabored           Heart/CV- RRR , no murmur , no gallop  , no rub, nl s1 s2                           - JVD- none , edema- none, stasis changes- none, varices- none            Lung- clear to P&A, wheeze- none, cough- none , dullness-none, rub- none           Chest wall-  Abd-  Br/ Gen/ Rectal- Not done, not indicated Extrem- cyanosis- none, clubbing, none, atrophy- none, strength- nl Neuro- grossly intact to observation

## 2019-11-27 ENCOUNTER — Encounter: Payer: Self-pay | Admitting: Internal Medicine

## 2019-11-27 ENCOUNTER — Ambulatory Visit: Payer: Medicare Other | Admitting: Internal Medicine

## 2019-11-27 ENCOUNTER — Telehealth: Payer: Self-pay | Admitting: Internal Medicine

## 2019-11-27 ENCOUNTER — Other Ambulatory Visit: Payer: Self-pay

## 2019-11-27 VITALS — BP 120/62 | HR 63 | Temp 98.2°F | Ht 63.0 in | Wt 114.2 lb

## 2019-11-27 DIAGNOSIS — R918 Other nonspecific abnormal finding of lung field: Secondary | ICD-10-CM | POA: Diagnosis not present

## 2019-11-27 DIAGNOSIS — R9389 Abnormal findings on diagnostic imaging of other specified body structures: Secondary | ICD-10-CM

## 2019-11-27 DIAGNOSIS — K219 Gastro-esophageal reflux disease without esophagitis: Secondary | ICD-10-CM

## 2019-11-27 NOTE — Assessment & Plan Note (Signed)
Continue reflux precautions and keep in mind possibility chest imaging shows residual of aspiration.

## 2019-11-27 NOTE — Assessment & Plan Note (Signed)
Demographic and images would be c/w atypical pneumonia such as MAIC. She has no cough or active symptoms.  Plan- Will change pending CT chest to high resolution, lab for Quantiferon TB gold assay. Expect to follow with imaging, defer bronchoscopy.

## 2019-11-27 NOTE — Patient Instructions (Signed)
Order- lab-  Quantiferon gold TB assay       Dx abnormal CT scan  Order- Radiology - please change current CT chest order to High Resolution, no contrast    Dx abnormal CT chest

## 2019-11-28 NOTE — Telephone Encounter (Signed)
Lm for patient.  

## 2019-11-29 LAB — QUANTIFERON-TB GOLD PLUS
Mitogen-NIL: 9.45 IU/mL
NIL: 0.03 IU/mL
QuantiFERON-TB Gold Plus: NEGATIVE
TB1-NIL: 0.03 IU/mL
TB2-NIL: 0.01 IU/mL

## 2019-12-01 NOTE — Telephone Encounter (Signed)
Pt returning call regarding a RX for sleep from Dr. Annamaria Boots -

## 2019-12-01 NOTE — Telephone Encounter (Signed)
Called and spoke with pt who was wanting to know if something could be called in for sleep. Pt said she discussed this a little at last OV with CY but did not know if he would be okay doing this for her.  Dr. Annamaria Boots, please advise if you are okay sending Rx to pharmacy for pt to help her sleep.  Preferred pharmacy is [facility].  Allergies  Allergen Reactions  . Keppra [Levetiracetam]     Depression  . Rosuvastatin Calcium Diarrhea    Muscle aches  . Statins Other (See Comments)    Muscle aches  . Lamictal [Lamotrigine] Rash     Current Outpatient Medications:  .  amLODipine (NORVASC) 5 MG tablet, TAKE 1 TABLET BY MOUTH EVERY DAY, Disp: 90 tablet, Rfl: 2 .  Calcium Carbonate-Vitamin D (CALCIUM-VITAMIN D3 PO), Take 15 mLs by mouth 2 (two) times daily., Disp: , Rfl:  .  CINNAMON PO, Take 1 capsule by mouth 2 (two) times daily., Disp: , Rfl:  .  clonazePAM (KLONOPIN) 1 MG tablet, Take 0.5 mg by mouth 2 (two) times daily as needed for anxiety., Disp: , Rfl:  .  Evolocumab (REPATHA SURECLICK) 166 MG/ML SOAJ, Inject 1 Dose into the skin every 14 (fourteen) days., Disp: 2 pen, Rfl: 11 .  levothyroxine (SYNTHROID) 88 MCG tablet, Take 88 mcg by mouth daily., Disp: , Rfl:  .  MILK THISTLE PO, Take 1 capsule by mouth 2 (two) times daily., Disp: , Rfl:  .  Multiple Vitamin (MULTIVITAMIN WITH MINERALS) TABS tablet, Take 1 tablet by mouth daily., Disp: , Rfl:  .  NONFORMULARY OR COMPOUNDED ITEM, [facility]:  Peripheral Neuropathy cream - Bupivacaine 1%, Doxepin 3%, Gabapentin 6%, Pentoxifylline 3%, Topiramate 1%, apply 1-2 grams to affected area 3-4 times daily., Disp: 100 each, Rfl: 5 .  olmesartan (BENICAR) 40 MG tablet, olmesartan 40 mg tablet  TAKE 1 TABLET BY MOUTH EVERY DAY, Disp: , Rfl:  .  Omega-3 Fatty Acids (FISH OIL PO), Take by mouth., Disp: , Rfl:  .  omeprazole (PRILOSEC) 20 MG capsule, Take 20 mg by mouth as needed. , Disp: , Rfl:  .  Polyethyl Glyc-Propyl Glyc  PF (SYSTANE ULTRA PF) 0.4-0.3 % SOLN, Place 1 drop into both eyes 3 (three) times daily as needed (for dry eyes)., Disp: , Rfl:

## 2019-12-01 NOTE — Telephone Encounter (Signed)
Attempted to call pt but unable to reach. Left message for her to return call. 

## 2019-12-01 NOTE — Telephone Encounter (Signed)
Patient is returning phone call. Patient phone number is [phone]. 

## 2019-12-02 ENCOUNTER — Encounter: Payer: Self-pay | Admitting: Internal Medicine

## 2019-12-02 ENCOUNTER — Telehealth: Payer: Self-pay | Admitting: Internal Medicine

## 2019-12-02 MED ORDER — TEMAZEPAM 15 MG PO CAPS: ORAL_CAPSULE | ORAL | 5 refills | Status: DC

## 2019-12-02 NOTE — Telephone Encounter (Signed)
Spoke with pt and notified of med being called in by Dr. Annamaria Boots. Pt stated understanding nothing further needed at this time.

## 2019-12-02 NOTE — Telephone Encounter (Signed)
Let's offer trial of trazodone 50 mg, # 30, 1 at bedtime for sleep as needed

## 2019-12-02 NOTE — Telephone Encounter (Signed)
I will send temazepam for trial if she hasn't tried that before. Otherwise please ask her what else she has tried that didn't previously.

## 2019-12-02 NOTE — Telephone Encounter (Signed)
Called and spoke with pt letting her know that CY could send trial of temazepam to pharmacy for her and she verbalized understanding stating that would be fine.  Preferred pharmacy for the Temazepam to be sent to [facility].  Routing back to Dr. Annamaria Boots.

## 2019-12-02 NOTE — Telephone Encounter (Signed)
Called and spoke with Patient.  Dr. Janee Morn recommendations given.  Patient declined Trazodone.  Patient stated she had tried Trazodone in the past and it did not help her sleep.  Patient is requesting another alternative to help with sleep.  Message routed to Dr Annamaria Boots to advise

## 2019-12-02 NOTE — Telephone Encounter (Signed)
Script sent for temazepam capsules. She can take 1 or 2 at bedtime if needed for sldep

## 2019-12-03 ENCOUNTER — Encounter: Payer: Self-pay | Admitting: *Deleted

## 2019-12-12 ENCOUNTER — Inpatient Hospital Stay: Admission: RE | Admit: 2019-12-12 | Payer: Medicare Other | Source: Ambulatory Visit

## 2019-12-12 ENCOUNTER — Ambulatory Visit: Payer: Medicare Other

## 2019-12-17 ENCOUNTER — Telehealth: Payer: Self-pay | Admitting: Internal Medicine

## 2019-12-17 NOTE — Telephone Encounter (Signed)
Called and LM on VM for the pt to call back.

## 2019-12-21 ENCOUNTER — Emergency Department (HOSPITAL_COMMUNITY)
Admission: EM | Admit: 2019-12-21 | Discharge: 2019-12-21 | Disposition: A | Discharge status: Home / Self Care | Payer: Medicare Other | Attending: Emergency Medicine | Admitting: Emergency Medicine

## 2019-12-21 ENCOUNTER — Encounter (HOSPITAL_COMMUNITY): Payer: Self-pay | Admitting: Emergency Medicine

## 2019-12-21 ENCOUNTER — Emergency Department (HOSPITAL_COMMUNITY): Payer: Medicare Other

## 2019-12-21 ENCOUNTER — Other Ambulatory Visit: Payer: Self-pay

## 2019-12-21 DIAGNOSIS — Z96662 Presence of left artificial ankle joint: Secondary | ICD-10-CM | POA: Diagnosis not present

## 2019-12-21 DIAGNOSIS — Y92410 Unspecified street and highway as the place of occurrence of the external cause: Secondary | ICD-10-CM | POA: Insufficient documentation

## 2019-12-21 DIAGNOSIS — M79642 Pain in left hand: Secondary | ICD-10-CM | POA: Insufficient documentation

## 2019-12-21 DIAGNOSIS — I10 Essential (primary) hypertension: Secondary | ICD-10-CM | POA: Insufficient documentation

## 2019-12-21 DIAGNOSIS — S92351A Displaced fracture of fifth metatarsal bone, right foot, initial encounter for closed fracture: Secondary | ICD-10-CM | POA: Insufficient documentation

## 2019-12-21 DIAGNOSIS — E039 Hypothyroidism, unspecified: Secondary | ICD-10-CM | POA: Diagnosis not present

## 2019-12-21 DIAGNOSIS — S299XXA Unspecified injury of thorax, initial encounter: Secondary | ICD-10-CM | POA: Diagnosis not present

## 2019-12-21 DIAGNOSIS — Z85828 Personal history of other malignant neoplasm of skin: Secondary | ICD-10-CM | POA: Diagnosis not present

## 2019-12-21 DIAGNOSIS — S0990XA Unspecified injury of head, initial encounter: Secondary | ICD-10-CM | POA: Insufficient documentation

## 2019-12-21 DIAGNOSIS — S99921A Unspecified injury of right foot, initial encounter: Secondary | ICD-10-CM | POA: Diagnosis present

## 2019-12-21 DIAGNOSIS — Z79899 Other long term (current) drug therapy: Secondary | ICD-10-CM | POA: Diagnosis not present

## 2019-12-21 DIAGNOSIS — M25522 Pain in left elbow: Secondary | ICD-10-CM | POA: Diagnosis not present

## 2019-12-21 DIAGNOSIS — M25512 Pain in left shoulder: Secondary | ICD-10-CM | POA: Diagnosis not present

## 2019-12-21 MED ORDER — HYDROCODONE-ACETAMINOPHEN 5-325 MG PO TABS
1.0000 | ORAL_TABLET | Freq: Once | ORAL | Status: AC
  Administered 2019-12-21: 1 via ORAL
  Filled 2019-12-21: qty 1

## 2019-12-21 MED ORDER — HYDROCODONE-ACETAMINOPHEN 5-325 MG PO TABS: 1.0000 | ORAL_TABLET | Freq: Four times a day (QID) | ORAL | 0 refills | Status: DC | PRN

## 2019-12-21 NOTE — Progress Notes (Signed)
Orthopedic Tech Progress Note Patient Details:  Nicole [name] 04-06-1940 [id number]  Ortho Devices Type of Ortho Device: Ace wrap, Ulna gutter splint Ortho Device/Splint Location: right Ortho Device/Splint Interventions: Application   Post Interventions Patient Tolerated: Well Instructions Provided: Care of device   [place] Pink 12/21/2019, 6:12 PM
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
## 2019-12-21 NOTE — Discharge Instructions (Addendum)
You have a fracture in the 5th finger of your right hand.  This needs attention from an orthopedic doctor, and a hand specialist.  You can call the number above on Monday, or else call your own orthopedic doctor tomorrow.  This should be seen in 1-2 weeks in the office, no longer.  Keep your cast dry at all times and wear it at all times.
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
## 2019-12-23 NOTE — Telephone Encounter (Signed)
lmtcb for pt.  

## 2020-01-05 ENCOUNTER — Telehealth: Payer: Self-pay | Admitting: Internal Medicine

## 2020-01-05 DIAGNOSIS — S62309A Unspecified fracture of unspecified metacarpal bone, initial encounter for closed fracture: Secondary | ICD-10-CM | POA: Insufficient documentation

## 2020-01-05 NOTE — Telephone Encounter (Signed)
PA for repatha sureclick submitted via CMM  (Key: [id number])

## 2020-01-07 ENCOUNTER — Other Ambulatory Visit: Payer: Self-pay

## 2020-01-07 ENCOUNTER — Ambulatory Visit
Admission: RE | Admit: 2020-01-07 | Discharge: 2020-01-07 | Disposition: A | Discharge status: Home / Self Care | Payer: Medicare Other | Source: Ambulatory Visit | Attending: Internal Medicine | Admitting: Internal Medicine

## 2020-01-07 DIAGNOSIS — R918 Other nonspecific abnormal finding of lung field: Secondary | ICD-10-CM

## 2020-01-07 NOTE — Telephone Encounter (Signed)
Request Reference Number: [id number]. REPATHA SURE INJ 140MG /ML is approved through 01/22/2021

## 2020-01-12 NOTE — Telephone Encounter (Signed)
Per triage protocol, message will be closed due to 2 unsuccessful attempts to reach pt.

## 2020-01-27 DIAGNOSIS — H01005 Unspecified blepharitis left lower eyelid: Secondary | ICD-10-CM | POA: Diagnosis not present

## 2020-01-30 DIAGNOSIS — S62307D Unspecified fracture of fifth metacarpal bone, left hand, subsequent encounter for fracture with routine healing: Secondary | ICD-10-CM | POA: Diagnosis not present

## 2020-02-23 ENCOUNTER — Telehealth: Payer: Self-pay | Admitting: Internal Medicine

## 2020-02-23 NOTE — Telephone Encounter (Signed)
Nicole [name], [place]  01/08/2020 10:54 AM EST      Spoke with patient regarding CT Chest result. They verbalized understanding. No further questions.  Nothing further needed at this time.   Deneise Lever, MD  01/08/2020 9:45 AM EST      CT chest- there is very mild fibrosis. We will discuss this at next office visit. Nothing needs to be done now. Small nodules noted on the cardiac CT scan are stable.    Spoke with the pt  She does not recall hearing her ct results  I went over these with her again  She verbalized understanding  She is aware she has appt 03/01/20 at 10 am with Dr Annamaria Boots and will keep this appt to discuss results further

## 2020-02-25 ENCOUNTER — Telehealth: Payer: Self-pay | Admitting: Internal Medicine

## 2020-02-25 NOTE — Telephone Encounter (Signed)
Attempted to call pt. Unable to leave message. Voicemail full.  Will call back.

## 2020-02-27 NOTE — Telephone Encounter (Signed)
ATC pt. VM box is full.   I do not see if pt's chart where we are needing to speak to pt. Pt has an appt with CY on 03/01/20.
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
## 2020-03-01 ENCOUNTER — Ambulatory Visit: Payer: Medicare Other | Admitting: Internal Medicine

## 2020-03-12 DIAGNOSIS — M810 Age-related osteoporosis without current pathological fracture: Secondary | ICD-10-CM | POA: Diagnosis not present

## 2020-03-15 ENCOUNTER — Ambulatory Visit: Payer: Medicare Other | Admitting: Internal Medicine

## 2020-03-15 DIAGNOSIS — Z6821 Body mass index (BMI) 21.0-21.9, adult: Secondary | ICD-10-CM | POA: Diagnosis not present

## 2020-03-15 DIAGNOSIS — R519 Headache, unspecified: Secondary | ICD-10-CM | POA: Diagnosis not present

## 2020-03-15 DIAGNOSIS — G629 Polyneuropathy, unspecified: Secondary | ICD-10-CM | POA: Diagnosis not present

## 2020-03-15 DIAGNOSIS — R300 Dysuria: Secondary | ICD-10-CM | POA: Diagnosis not present

## 2020-03-15 DIAGNOSIS — S62609A Fracture of unspecified phalanx of unspecified finger, initial encounter for closed fracture: Secondary | ICD-10-CM | POA: Diagnosis not present

## 2020-03-15 DIAGNOSIS — Z008 Encounter for other general examination: Secondary | ICD-10-CM | POA: Diagnosis not present

## 2020-03-15 DIAGNOSIS — R319 Hematuria, unspecified: Secondary | ICD-10-CM | POA: Diagnosis not present

## 2020-03-15 DIAGNOSIS — M810 Age-related osteoporosis without current pathological fracture: Secondary | ICD-10-CM | POA: Diagnosis not present

## 2020-03-15 DIAGNOSIS — Z0000 Encounter for general adult medical examination without abnormal findings: Secondary | ICD-10-CM | POA: Diagnosis not present
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
## 2020-03-16 ENCOUNTER — Telehealth: Payer: Self-pay | Admitting: Internal Medicine

## 2020-03-16 ENCOUNTER — Ambulatory Visit: Payer: Medicare Other | Admitting: Internal Medicine

## 2020-03-16 NOTE — Telephone Encounter (Signed)
LMTCB

## 2020-03-19 NOTE — Telephone Encounter (Signed)
lmtcb for pt.  Per pts chart, pt has already been notified of her December CT chest results twice. Pt also no showed her visit with CY on 2/7 to review said results. Pt has since scheduled and canceled several visit. Pt now has pending appt with Dr. Annamaria Boots on 03/29/2020.

## 2020-03-22 DIAGNOSIS — M79645 Pain in left finger(s): Secondary | ICD-10-CM | POA: Insufficient documentation

## 2020-03-22 DIAGNOSIS — M79642 Pain in left hand: Secondary | ICD-10-CM | POA: Diagnosis not present

## 2020-03-26 NOTE — Telephone Encounter (Signed)
Spoke with patient. She verbalized understanding of results. Reminded her of her appt on Monday with CY.  Nothing further needed at time of call.

## 2020-03-26 NOTE — Telephone Encounter (Signed)
Patient is returning phone call. Patient phone number is [phone].
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
## 2020-03-29 ENCOUNTER — Other Ambulatory Visit: Payer: Self-pay

## 2020-03-29 ENCOUNTER — Encounter: Payer: Self-pay | Admitting: Internal Medicine

## 2020-03-29 ENCOUNTER — Ambulatory Visit: Payer: Medicare Other | Admitting: Internal Medicine

## 2020-03-29 VITALS — BP 116/62 | HR 64 | Temp 98.0°F | Ht 61.0 in | Wt 113.0 lb

## 2020-03-29 DIAGNOSIS — J849 Interstitial pulmonary disease, unspecified: Secondary | ICD-10-CM

## 2020-03-29 DIAGNOSIS — F5101 Primary insomnia: Secondary | ICD-10-CM

## 2020-03-29 DIAGNOSIS — J84112 Idiopathic pulmonary fibrosis: Secondary | ICD-10-CM

## 2020-03-29 NOTE — Patient Instructions (Signed)
Order- schedule HRCT in June    Dx ILD   For the insomnia issue- we suggested try going to bed later, closer to when your brain is ready to go to sleep. Take your sleep medicine about 30 minutes before bedtime.      We may want to consider Belsomra later  Please call if we can help

## 2020-04-08 ENCOUNTER — Telehealth: Payer: Self-pay | Admitting: Internal Medicine

## 2020-04-08 DIAGNOSIS — J84112 Idiopathic pulmonary fibrosis: Secondary | ICD-10-CM

## 2020-04-08 NOTE — Telephone Encounter (Signed)
Dr. Annamaria Boots, please advise on this if you are able to call pt to further discuss the diagnosis.

## 2020-04-08 NOTE — Telephone Encounter (Signed)
Called and spoke with pt letting her know the info stated by CY and stated to her that he wants her to have a PFT and some labwork as he said we need to know if her lung disease is progressive.  Pt verbalized understanding. I scheduled pt for PFT 3/21. I did also schedule pt for covid PCR test Sat. 3/19 but after telling pt the location of where the covid test would be, pt said that she was going to call her pharmacy to see if they can do the covid test either tomorrow 3/18 or Saturday 3/19. Pt said if her pharmacy can do the covid test she would call us back to cancel the covid test appt that I scheduled.  Stated to pt that the covid test needs to be the Covid PCR test and cannot be a rapid or home covid test and she verbalized understanding.  All orders have been placed. Will leave encounter open while we wait to see if pt will be keeping the covid test appt that I have scheduled or if she will be getting covid test done at her pharmacy. I stated to pt that she would need to bring a copy of the covid test results with her to the PFT appt otherwise we would have to cancel/reschedule the appt if we did not have the test results and she verbalized understanding.

## 2020-04-08 NOTE — Telephone Encounter (Signed)
If she would like, we can schedule a return visit with me ( ok held spot) at the same time she comes to have blood work done.

## 2020-04-08 NOTE — Telephone Encounter (Signed)
We had discussed it at her last visit and I will be happy to discus it more when we know more about it. First we need to know if it is progressive. She has a return appointment in July. For now, can we order:  PFT     dx UIP  Lab-   Sed rate, Antiscleroderma antibodies, Sjogrens panel, ANCA, ANA, Rheumatoid Factor

## 2020-04-09 ENCOUNTER — Other Ambulatory Visit (HOSPITAL_COMMUNITY)
Admission: RE | Admit: 2020-04-09 | Discharge: 2020-04-09 | Disposition: A | Discharge status: Home / Self Care | Payer: Medicare Other | Source: Ambulatory Visit | Attending: Internal Medicine | Admitting: Internal Medicine

## 2020-04-09 DIAGNOSIS — Z01812 Encounter for preprocedural laboratory examination: Secondary | ICD-10-CM | POA: Diagnosis not present

## 2020-04-09 DIAGNOSIS — Z20822 Contact with and (suspected) exposure to covid-19: Secondary | ICD-10-CM | POA: Insufficient documentation

## 2020-04-10 ENCOUNTER — Other Ambulatory Visit (HOSPITAL_COMMUNITY): Payer: Medicare Other

## 2020-04-10 LAB — SARS CORONAVIRUS 2 (TAT 6-24 HRS): SARS Coronavirus 2: NEGATIVE

## 2020-04-12 ENCOUNTER — Ambulatory Visit (INDEPENDENT_AMBULATORY_CARE_PROVIDER_SITE_OTHER): Payer: Medicare Other | Admitting: Internal Medicine

## 2020-04-12 ENCOUNTER — Telehealth: Payer: Self-pay | Admitting: Internal Medicine

## 2020-04-12 ENCOUNTER — Other Ambulatory Visit: Payer: Self-pay

## 2020-04-12 DIAGNOSIS — J84112 Idiopathic pulmonary fibrosis: Secondary | ICD-10-CM

## 2020-04-12 LAB — PULMONARY FUNCTION TEST
DL/VA % pred: 88 %
DL/VA: 3.65 ml/min/mmHg/L
DLCO cor % pred: 76 %
DLCO cor: 13.87 ml/min/mmHg
DLCO unc % pred: 76 %
DLCO unc: 13.87 ml/min/mmHg
FEF 25-75 Post: 1.71 L/sec
FEF 25-75 Pre: 1.58 L/sec
FEF2575-%Change-Post: 8 %
FEF2575-%Pred-Post: 121 %
FEF2575-%Pred-Pre: 112 %
FEV1-%Change-Post: 5 %
FEV1-%Pred-Post: 112 %
FEV1-%Pred-Pre: 106 %
FEV1-Post: 2.12 L
FEV1-Pre: 2 L
FEV1FVC-%Change-Post: 4 %
FEV1FVC-%Pred-Pre: 102 %
FEV6-%Change-Post: 1 %
FEV6-%Pred-Post: 111 %
FEV6-%Pred-Pre: 109 %
FEV6-Post: 2.66 L
FEV6-Pre: 2.62 L
FEV6FVC-%Change-Post: 0 %
FEV6FVC-%Pred-Post: 104 %
FEV6FVC-%Pred-Pre: 104 %
FVC-%Change-Post: 1 %
FVC-%Pred-Post: 106 %
FVC-%Pred-Pre: 104 %
FVC-Post: 2.69 L
FVC-Pre: 2.64 L
Post FEV1/FVC ratio: 79 %
Post FEV6/FVC ratio: 99 %
Pre FEV1/FVC ratio: 76 %
Pre FEV6/FVC Ratio: 99 %
RV % pred: 85 %
RV: 1.98 L
TLC % pred: 91 %
TLC: 4.5 L

## 2020-04-12 NOTE — Progress Notes (Signed)
PFT done today. 

## 2020-04-12 NOTE — Telephone Encounter (Signed)
Called pt and line rang multiple times and sounded like someone picked up, but they did not speak. Will try back 04/13/20.

## 2020-04-13 NOTE — Telephone Encounter (Signed)
Lmtcb for pt.  

## 2020-04-14 ENCOUNTER — Telehealth: Payer: Self-pay | Admitting: Internal Medicine

## 2020-04-14 DIAGNOSIS — R3129 Other microscopic hematuria: Secondary | ICD-10-CM | POA: Diagnosis not present

## 2020-04-14 DIAGNOSIS — R800 Isolated proteinuria: Secondary | ICD-10-CM | POA: Diagnosis not present

## 2020-04-14 NOTE — Telephone Encounter (Signed)
Called and spoke with patient who was calling for PFT results and labs.  Advised her that we will send a message to Dr. Annamaria Boots regarding her PFT results. Advised patient that blood work was ordered for her on 3/17 and she can come in at her convenience to have them drawn. Routing message to Dr. Annamaria Boots    Dr. Annamaria Boots please advise.

## 2020-04-15 NOTE — Telephone Encounter (Signed)
PFT showed only very minimal slowing of airflow- essentially normal. Good report.

## 2020-04-16 ENCOUNTER — Ambulatory Visit: Payer: Medicare Other | Admitting: Internal Medicine

## 2020-04-16 NOTE — Telephone Encounter (Signed)
Called and spoke with pt letting her know the results of the PFT and she verbalized understanding. Nothing further needed.

## 2020-04-19 DIAGNOSIS — H26492 Other secondary cataract, left eye: Secondary | ICD-10-CM | POA: Diagnosis not present

## 2020-04-19 DIAGNOSIS — H26491 Other secondary cataract, right eye: Secondary | ICD-10-CM | POA: Diagnosis not present

## 2020-04-20 ENCOUNTER — Other Ambulatory Visit (INDEPENDENT_AMBULATORY_CARE_PROVIDER_SITE_OTHER): Payer: Medicare Other

## 2020-04-20 DIAGNOSIS — J84112 Idiopathic pulmonary fibrosis: Secondary | ICD-10-CM

## 2020-04-20 NOTE — Telephone Encounter (Signed)
ATC pt. VM box is full. Will close message as we have made several attempts to reach pt with no call back.

## 2020-04-21 LAB — SEDIMENTATION RATE: Sed Rate: 18 mm/hr (ref 0–30)

## 2020-04-22 ENCOUNTER — Telehealth: Payer: Self-pay | Admitting: Internal Medicine

## 2020-04-22 LAB — RHEUMATOID FACTOR: Rheumatoid fact SerPl-aCnc: <14 IU/mL (ref <14)

## 2020-04-22 LAB — SJOGREN'S SYNDROME ANTIBODS(SSA + SSB)
SSA (Ro) (ENA) Antibody, IgG: <1 AI
SSB (La) (ENA) Antibody, IgG: <1 AI

## 2020-04-22 LAB — ANTI-NUCLEAR AB-TITER (ANA TITER): ANA Titer 1: 1:80 {titer} — ABNORMAL HIGH

## 2020-04-22 LAB — ANCA SCREEN W REFLEX TITER: ANCA Screen: NEGATIVE

## 2020-04-22 LAB — ANA: Anti Nuclear Antibody (ANA): POSITIVE — AB

## 2020-04-22 LAB — ANTI-SCLERODERMA ANTIBODY: Scleroderma (Scl-70) (ENA) Antibody, IgG: <1 AI

## 2020-04-22 NOTE — Telephone Encounter (Signed)
We called her bc she left msg on 04/12/20 asking about sleep aid   Tried calling her again and still no answer- LMTCB.

## 2020-04-23 NOTE — Progress Notes (Signed)
Called and left message on voicemail to please return phone call to go over lab results. Contact number provided.

## 2020-04-26 ENCOUNTER — Ambulatory Visit: Payer: Medicare Other | Admitting: Internal Medicine

## 2020-04-26 DIAGNOSIS — H26491 Other secondary cataract, right eye: Secondary | ICD-10-CM | POA: Diagnosis not present

## 2020-04-29 NOTE — Telephone Encounter (Signed)
lmtcb for pt.  

## 2020-05-04 NOTE — Telephone Encounter (Signed)
Lmtcb for pt.  Will close encounter. We have tried on multiple occassions to reach pt and no return call. Pt has OV with CY on 05/27/20.
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
## 2020-05-27 ENCOUNTER — Encounter: Payer: Self-pay | Admitting: Internal Medicine

## 2020-05-27 ENCOUNTER — Ambulatory Visit: Payer: Medicare Other | Admitting: Internal Medicine

## 2020-05-27 ENCOUNTER — Other Ambulatory Visit: Payer: Self-pay

## 2020-05-27 DIAGNOSIS — F5101 Primary insomnia: Secondary | ICD-10-CM

## 2020-05-27 DIAGNOSIS — J84112 Idiopathic pulmonary fibrosis: Secondary | ICD-10-CM | POA: Diagnosis not present

## 2020-05-27 MED ORDER — BELSOMRA 20 MG PO TABS: ORAL_TABLET | ORAL | 5 refills | Status: DC

## 2020-05-27 NOTE — Patient Instructions (Signed)
CT pending for status comparison of interstitial lung disease  Script sent for Belsomra for sleep- 1 as you get settled to read at night  Please call if we can help

## 2020-05-29 ENCOUNTER — Other Ambulatory Visit: Payer: Self-pay | Admitting: Internal Medicine

## 2020-05-31 ENCOUNTER — Telehealth: Payer: Self-pay | Admitting: Internal Medicine

## 2020-05-31 NOTE — Telephone Encounter (Signed)
Primary Pulmonologist: CY Last office visit and with whom: 05/27/20 with CY What do we see them for (pulmonary problems): UIP Last OV assessment/plan:   CT pending for status comparison of interstitial lung disease  Script sent for Belsomra for sleep- 1 as you get settled to read at night  Please call if we can help   Was appointment offered to patient (explain)?  no   Reason for call: pt called to see if we have any coupons or samples of the belsomra.  There are no coupons in the sample closet.  I did look this up on [facility] [employer] and even with the coupon, all of the pharmacies have this listed at $400 or more a month for a 30 day supply.   CY please advise. Thanks  Allergies  Allergen Reactions  . Keppra [Levetiracetam]     Depression  . Rosuvastatin Calcium Diarrhea    Muscle aches  . Statins Other (See Comments)    Muscle aches  . Lamictal [Lamotrigine] Rash    Immunization History  Administered Date(s) Administered  . Influenza-Unspecified 10/13/2019  . PFIZER(Purple Top)SARS-COV-2 Vaccination 03/31/2019, 04/30/2019, 11/10/2019  . Pneumococcal Conjugate-13 07/14/2013  . Pneumococcal Polysaccharide-23 05/03/2006  . Tdap 07/11/2011  . Zoster 01/19/2009

## 2020-05-31 NOTE — Telephone Encounter (Signed)
If she can't google it and get a coupon, I'm afraid it will be too expensive and we will have to see if we can find something else. Does it work well enough that she would be willing to pay what insurance doesn't cover??

## 2020-06-01 NOTE — Telephone Encounter (Signed)
Called and spoke with Patient.  Dr. Janee Morn recommendations given.  Patient stated she purchased a Belsomra 40 days supply.  Patient stated she is going to go online a [employer]/film/video for any patient assistance, coupons, or discounts.  Patient stated she may contact insurance for other Belsomra alternatives. Advise Patient to call if she had any questions or concerns.  Understanding stated.  Nothing further at this time.

## 2020-06-01 NOTE — Telephone Encounter (Signed)
Pt returning a phone call. Pt can be reached at [phone].

## 2020-06-01 NOTE — Telephone Encounter (Signed)
I have called and LM on VM for the pt to call us back.  

## 2020-06-03 DIAGNOSIS — U071 COVID-19: Secondary | ICD-10-CM | POA: Diagnosis not present

## 2020-06-03 DIAGNOSIS — J0110 Acute frontal sinusitis, unspecified: Secondary | ICD-10-CM | POA: Diagnosis not present

## 2020-06-03 DIAGNOSIS — R519 Headache, unspecified: Secondary | ICD-10-CM | POA: Diagnosis not present

## 2020-06-03 DIAGNOSIS — J029 Acute pharyngitis, unspecified: Secondary | ICD-10-CM | POA: Diagnosis not present

## 2020-06-04 ENCOUNTER — Telehealth: Payer: Self-pay | Admitting: Internal Medicine

## 2020-06-04 ENCOUNTER — Telehealth: Payer: Self-pay | Admitting: Oncology

## 2020-06-04 ENCOUNTER — Other Ambulatory Visit: Payer: Self-pay | Admitting: Oncology

## 2020-06-04 ENCOUNTER — Encounter: Payer: Self-pay | Admitting: Oncology

## 2020-06-04 DIAGNOSIS — U071 COVID-19: Secondary | ICD-10-CM

## 2020-06-04 MED ORDER — MOLNUPIRAVIR EUA 200MG CAPSULE
4.0000 | ORAL_CAPSULE | Freq: Two times a day (BID) | ORAL | 0 refills | Status: AC
Start: 2020-06-04 — End: 2020-06-09

## 2020-06-04 NOTE — Telephone Encounter (Signed)
Please refer her to [facility] infusion center for consideration of Covid infection treatment.

## 2020-06-04 NOTE — Telephone Encounter (Signed)
Called and spoke with patient, advised of recommendations per Dr. Annamaria Boots.  She verbalized understanding.  Referral sent.  Nothing further needed.

## 2020-06-04 NOTE — Progress Notes (Signed)
I connected by phone with Nicole [name] on 06/04/2020 at 2:46 PM to discuss the potential use of a new treatment for mild to moderate COVID-19 viral infection in non-hospitalized patients.  This patient is a 80 y.o. female that meets the FDA criteria for Emergency Use Authorization of COVID monoclonal antibody bebtelovimab.  Has a (+) direct SARS-CoV-2 viral test result  Has mild or moderate COVID-19   Is NOT hospitalized due to COVID-19  Is within 10 days of symptom onset  Has at least one of the high risk factor(s) for progression to severe COVID-19 and/or hospitalization as defined in EUA.  Specific high risk criteria : Older age (>/= 80 yo), Chronic Kidney Disease (CKD) and Cardiovascular disease or hypertension   I have spoken and communicated the following to the patient or parent/caregiver regarding COVID monoclonal antibody treatment:  1. FDA has authorized the emergency use for the treatment of mild to moderate COVID-19 in adults and pediatric patients with positive results of direct SARS-CoV-2 viral testing who are 98 years of age and older weighing at least 40 kg, and who are at high risk for progressing to severe COVID-19 and/or hospitalization.  2. The significant known and potential risks and benefits of COVID monoclonal antibody, and the extent to which such potential risks and benefits are unknown.  3. Information on available alternative treatments and the risks and benefits of those alternatives, including clinical trials.  4. Patients treated with COVID monoclonal antibody should continue to self-isolate and use infection control measures (e.g., wear mask, isolate, social distance, avoid sharing personal items, clean and disinfect "high touch" surfaces, and frequent handwashing) according to CDC guidelines.   5. The patient or parent/caregiver has the option to accept or refuse COVID monoclonal antibody treatment.  6. Discussion about the monoclonal antibody  infusion does not ensure treatment. The patient will be placed on a list and scheduled according to risk, symptom onset and availability. A scheduler will reach to the patient to let them know if we can accommodate their infusion or not.  After reviewing this information with the patient, the patient has agreed to receive one of the available covid 19 monoclonal antibodies and will be provided an appropriate fact sheet prior to infusion. Jacquelin Hawking, NP 06/04/2020 2:46 PM

## 2020-06-04 NOTE — Telephone Encounter (Signed)
Outpatient Oral COVID Treatment Note  I connected with Nicole [name] on 06/04/2020/2:33 PM by telephone and verified that I am speaking with the correct person using two identifiers.  I discussed the limitations, risks, security, and privacy concerns of performing an evaluation and management service by telephone and the availability of in person appointments. I also discussed with the patient that there may be a patient responsible charge related to this service. The patient expressed understanding and agreed to proceed.  Patient location: Home Provider location: Clinic   Diagnosis: COVID-19 infection  Purpose of visit: Discussion of potential use of Molnupiravir or Paxlovid, a new treatment for mild to moderate COVID-19 viral infection in non-hospitalized patients.   Subjective: Patient is a 80 y.o. female who has been diagnosed with COVID 19 viral infection.  Their symptoms began on 06/02/20    Past Medical History:  Diagnosis Date  . Anxiety   . Arthritis   . Cancer (HCC)    Basal cell  . GERD (gastroesophageal reflux disease)   . HTN (hypertension)   . Hyperlipemia   . Hypothyroidism   . Increased liver enzymes   . PONV (postoperative nausea and vomiting)     Allergies  Allergen Reactions  . Keppra [Levetiracetam]     Depression  . Rosuvastatin Calcium Diarrhea    Muscle aches  . Statins Other (See Comments)    Muscle aches  . Lamictal [Lamotrigine] Rash     Current Outpatient Medications:  .  amLODipine (NORVASC) 5 MG tablet, TAKE 1 TABLET BY MOUTH EVERY DAY, Disp: 90 tablet, Rfl: 2 .  Calcium Carbonate-Vitamin D (CALCIUM-VITAMIN D3 PO), Take 15 mLs by mouth 2 (two) times daily., Disp: , Rfl:  .  CINNAMON PO, Take 1 capsule by mouth 2 (two) times daily., Disp: , Rfl:  .  clonazePAM (KLONOPIN) 1 MG tablet, Take 0.5 mg by mouth 2 (two) times daily as needed for anxiety., Disp: , Rfl:  .  denosumab (PROLIA) 60 MG/ML SOSY injection, Inject 60 mg into the skin  every 6 (six) months., Disp: , Rfl:  .  doxepin (SINEQUAN) 50 MG capsule, Take 50 mg by mouth at bedtime., Disp: , Rfl:  .  HYDROcodone-acetaminophen (NORCO/VICODIN) 5-325 MG tablet, Take 1 tablet by mouth every 6 (six) hours as needed for up to 15 doses for severe pain., Disp: 15 tablet, Rfl: 0 .  levothyroxine (SYNTHROID) 88 MCG tablet, Take 88 mcg by mouth daily., Disp: , Rfl:  .  MILK THISTLE PO, Take 1 capsule by mouth 2 (two) times daily., Disp: , Rfl:  .  Multiple Vitamin (MULTIVITAMIN WITH MINERALS) TABS tablet, Take 1 tablet by mouth daily., Disp: , Rfl:  .  NONFORMULARY OR COMPOUNDED ITEM, [facility]:  Peripheral Neuropathy cream - Bupivacaine 1%, Doxepin 3%, Gabapentin 6%, Pentoxifylline 3%, Topiramate 1%, apply 1-2 grams to affected area 3-4 times daily. (Patient not taking: Reported on 05/27/2020), Disp: 100 each, Rfl: 5 .  olmesartan (BENICAR) 40 MG tablet, olmesartan 40 mg tablet  TAKE 1 TABLET BY MOUTH EVERY DAY, Disp: , Rfl:  .  Omega-3 Fatty Acids (FISH OIL PO), Take by mouth., Disp: , Rfl:  .  omeprazole (PRILOSEC) 20 MG capsule, Take 20 mg by mouth as needed. , Disp: , Rfl:  .  Polyethyl Glyc-Propyl Glyc PF 0.4-0.3 % SOLN, Place 1 drop into both eyes 3 (three) times daily as needed (for dry eyes). (Patient not taking: Reported on 05/27/2020), Disp: , Rfl:  .  REPATHA SURECLICK 166 MG/ML  SOAJ, INJECT 1 DOSE INTO THE SKIN EVERY 14 DAYS, Disp: 2 mL, Rfl: 11 .  Suvorexant (BELSOMRA) 20 MG TABS, 1 for sleep, Disp: 30 tablet, Rfl: 5  Objective: Patient sounds well.  They are in no apparent distress.  Breathing is non labored.  Mood and behavior are normal.  Laboratory Data:  Recent Results (from the past 2160 hour(s))  SARS CORONAVIRUS 2 (TAT 6-24 HRS) Nasopharyngeal Nasopharyngeal Swab     Status: None   Collection Time: 04/09/20  1:55 PM   Specimen: Nasopharyngeal Swab  Result Value Ref Range   SARS Coronavirus 2 NEGATIVE NEGATIVE    Comment: (NOTE) SARS-CoV-2 target  nucleic acids are NOT DETECTED.  The SARS-CoV-2 RNA is generally detectable in upper and lower respiratory specimens during the acute phase of infection. Negative results do not preclude SARS-CoV-2 infection, do not rule out co-infections with other pathogens, and should not be used as the sole basis for treatment or other patient management decisions. Negative results must be combined with clinical observations, patient history, and epidemiological information. The expected result is Negative.  Fact Sheet for Patients: SugarRoll.be  Fact Sheet for Healthcare Providers: https://www.woods-mathews.com/  This test is not yet approved or cleared by the [place] FDA and  has been authorized for detection and/or diagnosis of SARS-CoV-2 by FDA under an Emergency Use Authorization (EUA). This EUA will remain  in effect (meaning this test can be used) for the duration of the COVID-19 declaration under Se ction 564(b)(1) of the Act, 21 U.S.C. section 360bbb-3(b)(1), unless the authorization is terminated or revoked sooner.  Performed at [facility], [place] [address] [place],  [address]   Pulmonary function test     Status: None   Collection Time: 04/12/20 11:36 AM  Result Value Ref Range   FVC-Pre 2.64 L   FVC-%Pred-Pre 104 %   FVC-Post 2.69 L   FVC-%Pred-Post 106 %   FVC-%Change-Post 1 %   FEV1-Pre 2.00 L   FEV1-%Pred-Pre 106 %   FEV1-Post 2.12 L   FEV1-%Pred-Post 112 %   FEV1-%Change-Post 5 %   FEV6-Pre 2.62 L   FEV6-%Pred-Pre 109 %   FEV6-Post 2.66 L   FEV6-%Pred-Post 111 %   FEV6-%Change-Post 1 %   Pre FEV1/FVC ratio 76 %   FEV1FVC-%Pred-Pre 102 %   Post FEV1/FVC ratio 79 %   FEV1FVC-%Change-Post 4 %   Pre FEV6/FVC Ratio 99 %   FEV6FVC-%Pred-Pre 104 %   Post FEV6/FVC ratio 99 %   FEV6FVC-%Pred-Post 104 %   FEV6FVC-%Change-Post 0 %   FEF 25-75 Pre 1.58 L/sec   FEF2575-%Pred-Pre 112 %   FEF 25-75 Post 1.71  L/sec   FEF2575-%Pred-Post 121 %   FEF2575-%Change-Post 8 %   RV 1.98 L   RV % pred 85 %   TLC 4.50 L   TLC % pred 91 %   DLCO unc 13.87 ml/min/mmHg   DLCO unc % pred 76 %   DLCO cor 13.87 ml/min/mmHg   DLCO cor % pred 76 %   DL/VA 3.65 ml/min/mmHg/L   DL/VA % pred 88 %  Rheumatoid factor     Status: None   Collection Time: 04/20/20  3:42 PM  Result Value Ref Range   Rhuematoid fact SerPl-aCnc <14 <14 IU/mL  ANCA screen with reflex titer     Status: None   Collection Time: 04/20/20  3:42 PM  Result Value Ref Range   ANCA Screen NEGATIVE NEGATIVE    Comment: ANCA Screen includes evaluation for  p-ANCA, c-ANCA and atypical p-ANCA. A positive ANCA screen reflexes to titer and pattern(s), e.g., cytoplasmic pattern (c-ANCA), perinuclear pattern (p-ANCA), or atypical p-ANCA pattern.  c-ANCA and p-ANCA are observed in vasculitis, whereas atypical p-ANCA is observed in IBD (Inflammatory Bowel Disease). Atypical p-ANCA is  detected in about 55% to 80% of patients with ulcerative colitis but only 5% to 25% of patients with Crohn's disease. .   ANA     Status: Abnormal   Collection Time: 04/20/20  3:42 PM  Result Value Ref Range   Anti Nuclear Antibody (ANA) POSITIVE (A) NEGATIVE    Comment: ANA IFA is a first line screen for detecting the presence of up to approximately 150 autoantibodies in various autoimmune diseases. A positive ANA IFA result is suggestive of autoimmune disease and reflexes to titer and pattern. Further laboratory testing may be considered if clinically indicated. . For additional information, please refer to http://education.[facility].com/faq/FAQ177 (This link is being provided for informational/ educational purposes only.) .   Sjogren's syndrome antibods(ssa + ssb)     Status: None   Collection Time: 04/20/20  3:42 PM  Result Value Ref Range   SSA (Ro) (ENA) Antibody, IgG <1.0 NEG <1.0 NEG AI   SSB (La) (ENA) Antibody, IgG <1.0 NEG <1.0 NEG AI   Anti-scleroderma antibody     Status: None   Collection Time: 04/20/20  3:42 PM  Result Value Ref Range   Scleroderma (Scl-70) (ENA) Antibody, IgG <1.0 NEG <1.0 NEG AI  Sedimentation rate     Status: None   Collection Time: 04/20/20  3:42 PM  Result Value Ref Range   Sed Rate 18 0 - 30 mm/hr  Anti-nuclear ab-titer (ANA titer)     Status: Abnormal   Collection Time: 04/20/20  3:42 PM  Result Value Ref Range   ANA Titer 1 1:80 (H) titer    Comment: A low level ANA titer may be present in pre-clinical autoimmune diseases and normal individuals.                 Reference Range                 <1:40        Negative                 1:40-1:80    Low Antibody Level                 >1:80        Elevated Antibody Level .    ANA Pattern 1 Cytoplasmic (A)     Comment: The presence of cytoplasmic fluorescence was noted on the HEp-2 slide. Other reactivities (e.g., anti- mitochondrial antibodies or anti-smooth muscle antibodies) may be responsible for this fluorescence. The clinical significance of this finding is uncertain. Clinical correlation is recommended. . AC-15 to AC-23: Cytoplasmic . International Consensus on ANA Patterns (https://www.hernandez-brewer.com/)      Assessment: 80 y.o. female with mild/moderate COVID 19 viral infection diagnosed on  at high risk for progression to severe COVID 19.  Plan:  This patient is a 80 y.o. female that meets the following criteria for Emergency Use Authorization of: Molnupiravir  1. Age >18 yr 2. SARS-COV-2 positive test 3. Symptom onset < 5 days 4. Mild-to-moderate COVID disease with high risk for severe progression to hospitalization or death   I have spoken and communicated the following to the patient or parent/caregiver regarding: 1. Molnupiravir is an unapproved drug that is authorized for use under an Emergency  Use Authorization.  2. There are no adequate, approved, available products for the treatment of COVID-19 in adults  who have mild-to-moderate COVID-19 and are at high risk for progressing to severe COVID-19, including hospitalization or death. 3. Other therapeutics are currently authorized. For additional information on all products authorized for treatment or prevention of COVID-19, please see TanEmporium.pl.  4. There are benefits and risks of taking this treatment as outlined in the "Fact Sheet for Patients and Caregivers."  5. "Fact Sheet for Patients and Caregivers" was reviewed with patient. A hard copy will be provided to patient from pharmacy prior to the patient receiving treatment. 6. Patients should continue to self-isolate and use infection control measures (e.g., wear mask, isolate, social distance, avoid sharing personal items, clean and disinfect "high touch" surfaces, and frequent handwashing) according to CDC guidelines.  7. The patient or parent/caregiver has the option to accept or refuse treatment. 8. [place] has established a pregnancy surveillance program. 9. Females of childbearing potential should use a reliable method of contraception correctly and consistently, as applicable, for the duration of treatment and for 4 days after the last dose of Molnupiravir. 42. Males of reproductive potential who are sexually active with females of childbearing potential should use a reliable method of contraception correctly and consistently during treatment and for at least 3 months after the last dose. 11. Pregnancy status and risk was assessed. Patient verbalized understanding of precautions.   After reviewing above information with the patient, the patient agrees to receive molnupiravir.  Follow up instructions:    . Take prescription BID x 5 days as directed . Reach out to pharmacist for counseling on medication if desired . For concerns regarding further COVID symptoms please  follow up with your PCP or urgent care . For urgent or life-threatening issues, seek care at your local emergency department  The patient was provided an opportunity to ask questions, and all were answered. The patient agreed with the plan and demonstrated an understanding of the instructions.    The patient was advised to call their PCP or seek an in-person evaluation if the symptoms worsen or if the condition fails to improve as anticipated.   I provided 15 minutes of non face-to-face telephone visit time during this encounter, and > 50% was spent counseling as documented under my assessment & plan.  Jacquelin Hawking, NP 06/04/2020 /2:33 PM

## 2020-06-04 NOTE — Telephone Encounter (Signed)
Patient stated that she has a month's worth of Repatha left. She thinks it is time for a prior auth because she had to pay for it this time.   She was also calling to let Dr. Debara Pickett know that she has covid and will be getting the infusion soon.

## 2020-06-04 NOTE — Telephone Encounter (Signed)
Pt c/o medication issue:  1. Name of Medication: REPATHA SURECLICK 031 MG/ML SOAJ  2. How are you currently taking this medication (dosage and times per day)? 1 injection every 14 days  3. Are you having a reaction (difficulty breathing--STAT)? no  4. What is your medication issue? Patient states her pharmacy charged her for the medication and is not sure if it needs to be authorized again. Patient states on her CT results there was something listen there and would like to know whether it is concerning.

## 2020-06-04 NOTE — Telephone Encounter (Signed)
Primary Pulmonologist: Nicole [name] Last office visit and with whom: 05/27/2020 Nicole [name] What do we see them for (pulmonary problems): Lung nodule, bronchiectasis, ILD/UIP, GERD Last OV assessment/plan:  05/27/20-  79 yoF never smoker,followed for lung Nodules, Bronchiectasis, ILD/UIP  complicated by  HTN, GERD, Hepatic Steatosis, Hypothyroid, PONV, Arthritis, Hyperlipidemia, CAD, Aortic Atherosclerosis,   (widow of my patient Emmani Lesueur who died with complications of thymic cancer, esophageal cancer, aspiration pneumonia, chronic lung disease.) -clonazepam 1 mg, doxepin 50, Norco, temazepam 15 Covid vax- 3 Phizer HST 04/24/19- AHI 1.2/ hr PFT 04/12/20- minimal obstruction(possible), no response to BD, Nl lung volumes, minimal DLCO deficit UIP w/u labs 3/29- negative. Pending CT chest in June    ROS-see HPI   + = positive Constitutional:    weight loss, night sweats, fevers, chills, fatigue, lassitude. HEENT:    headaches, difficulty swallowing, tooth/dental problems, sore throat,                  sneezing, itching, ear ache, nasal congestion, post nasal drip, snoring CV:    chest pain, orthopnea, PND, swelling in lower extremities, anasarca,                                   dizziness, palpitations Resp:   shortness of breath with exertion or at rest.                productive cough,   non-productive cough, coughing up of blood.              change in color of mucus.  wheezing.   Skin:    rash or lesions. GI:  No-   heartburn, indigestion, abdominal pain, nausea, vomiting, diarrhea,                 change in bowel habits, loss of appetite GU: dysuria, change in color of urine, no urgency or frequency.   flank pain. MS:   joint pain, stiffness, decreased range of motion, back pain. Neuro-     nothing unusual Psych:  change in mood or affect.  depression or anxiety.   memory loss.  OBJ- Physical Exam General- Alert, Oriented, Affect-appropriate, Distress- none acute Skin- rash-none, lesions- none,  excoriation- none Lymphadenopathy- none Head- atraumatic            Eyes- Gross vision intact, PERRLA, conjunctivae and secretions clear            Ears- Hearing, canals-normal            Nose- Clear, no-Septal dev, mucus, polyps, erosion, perforation             Throat- Mallampati II , mucosa clear , drainage- none, tonsils- atrophic Neck- flexible , trachea midline, no stridor , thyroid nl, carotid no bruit Chest - symmetrical excursion , unlabored           Heart/CV- RRR , no murmur , no gallop  , no rub, nl s1 s2                           - JVD- none , edema- none, stasis changes- none, varices- none           Lung- clear to P&A, wheeze- none, cough- none , dullness-none, rub- none           Chest wall-  Abd-  Br/ Gen/ Rectal- Not done, not indicated Extrem- cyanosis-  none, clubbing, none, atrophy- none, strength- nl Neuro- grossly intact to observation         Patient Instructions by Deneise Lever, MD at 05/27/2020 3:30 PM  Author: Deneise Lever, MD Author Type: Physician Filed: 05/27/2020 4:02 PM  Note Status: Signed Cosign: Cosign Not Required Encounter Date: 05/27/2020  Editor: Deneise Lever, MD (Physician)               CT pending for status comparison of interstitial lung disease  Script sent for Spelter for sleep- 1 as you get settled to read at night  Please call if we can help        Instructions     Return in about 6 months (around 11/27/2020).  CT pending for status comparison of interstitial lung disease  Script sent for Belsomra for sleep- 1 as you get settled to read at night  Please call if we can help       Reason for call: Diagnosed with Covid on 06/03/20, having nasal congestion, sore throat and cough.  Has post nasal drip with croupy cough.  Cough is not productive.  Denies fever, has body aches, no chills, has fatigue.  She is eating and drinking normally.  Appetite is poor.  She had covid vaccines x 2 plus booster.  Denies any  sob or wheezing.  Using salt water gargles for sore throat.  Has not taken anything for nasal congestion.  She was told by a clinic that she would not be a candidate for the oral medications because it is hard on the kidneys.  She wants to know if she would be a candidate for the infusion.  Dr. Annamaria Boots, please advise.  Thank you.  (examples of things to ask: : When did symptoms start? Fever? Cough? Productive? Color to sputum? More sputum than usual? Wheezing? Have you needed increased oxygen? Are you taking your respiratory medications? What over the counter measures have you tried?)  Allergies  Allergen Reactions  . Keppra [Levetiracetam]     Depression  . Rosuvastatin Calcium Diarrhea    Muscle aches  . Statins Other (See Comments)    Muscle aches  . Lamictal [Lamotrigine] Rash    Immunization History  Administered Date(s) Administered  . Influenza-Unspecified 10/13/2019  . PFIZER(Purple Top)SARS-COV-2 Vaccination 03/31/2019, 04/30/2019, 11/10/2019  . Pneumococcal Conjugate-13 07/14/2013  . Pneumococcal Polysaccharide-23 05/03/2006  . Tdap 07/11/2011  . Zoster 01/19/2009

## 2020-06-07 NOTE — Telephone Encounter (Signed)
    Pt would like to speak with Eliezer Lofts, she said it's regarding the transfusion Dr. Debara Pickett recommend since she have covid, the pt said her pcp gave her the anti-viral med and would like to ask if its ok not to get the transfusion

## 2020-06-07 NOTE — Telephone Encounter (Signed)
Patient grant from [employer] - expired 05/09/20 Explained this to patient and completed renewal application while on phone - approved Letter emailed to patient with all ID #s as below ([email or web address])    HealthWell ID  [id number]  Pre-approval Date 06/07/2020 Patient Name  Nicole [name]  Received Date 06/07/2020 Fund Hypercholesterolemia - Medicare Access  Start Date  05/10/2020 End Date  05/09/2021     Pharmacy Card Information Pharmacy Card ID [id number]  Group   [id number] PCN   [id number]  BIN   [id number] PROCESSOR PDMI  Grant Cap Amount $2,500.00

## 2020-06-07 NOTE — Telephone Encounter (Signed)
Called patient and answered her questions.  Dr Lemmie Evens

## 2020-06-14 DIAGNOSIS — R300 Dysuria: Secondary | ICD-10-CM | POA: Diagnosis not present

## 2020-06-25 ENCOUNTER — Inpatient Hospital Stay: Admission: RE | Admit: 2020-06-25 | Payer: Medicare Other | Source: Ambulatory Visit

## 2020-07-05 DIAGNOSIS — I129 Hypertensive chronic kidney disease with stage 1 through stage 4 chronic kidney disease, or unspecified chronic kidney disease: Secondary | ICD-10-CM | POA: Diagnosis not present

## 2020-07-05 DIAGNOSIS — D649 Anemia, unspecified: Secondary | ICD-10-CM | POA: Diagnosis not present

## 2020-07-05 DIAGNOSIS — E559 Vitamin D deficiency, unspecified: Secondary | ICD-10-CM | POA: Diagnosis not present

## 2020-07-05 DIAGNOSIS — Z79899 Other long term (current) drug therapy: Secondary | ICD-10-CM | POA: Diagnosis not present

## 2020-07-05 DIAGNOSIS — J8410 Pulmonary fibrosis, unspecified: Secondary | ICD-10-CM | POA: Diagnosis not present

## 2020-07-05 DIAGNOSIS — M792 Neuralgia and neuritis, unspecified: Secondary | ICD-10-CM | POA: Diagnosis not present

## 2020-07-05 DIAGNOSIS — E039 Hypothyroidism, unspecified: Secondary | ICD-10-CM | POA: Diagnosis not present

## 2020-07-05 DIAGNOSIS — N181 Chronic kidney disease, stage 1: Secondary | ICD-10-CM | POA: Diagnosis not present

## 2020-07-05 DIAGNOSIS — E7800 Pure hypercholesterolemia, unspecified: Secondary | ICD-10-CM | POA: Diagnosis not present

## 2020-07-05 DIAGNOSIS — E1121 Type 2 diabetes mellitus with diabetic nephropathy: Secondary | ICD-10-CM | POA: Diagnosis not present

## 2020-07-06 ENCOUNTER — Telehealth: Payer: Self-pay | Admitting: Internal Medicine

## 2020-07-06 NOTE — Telephone Encounter (Signed)
Left message to call back  MyChart message sent -- healthwell foundation funds for hypercholesterolemia are currently closed -- provided website for [employer] -- provide website for [employer] application

## 2020-07-06 NOTE — Telephone Encounter (Signed)
Pt c/o medication issue: 1. Name of Medication: Repatha 2. How are you currently taking this medication (dosage and times per day)? Once every 14 days 3. Are you having a reaction (difficulty breathing--STAT)?  No  4. What is your medication issue? Patient need assistants.

## 2020-07-07 NOTE — Telephone Encounter (Signed)
Patient returning a call for [place].  Approved for $[id number]   Application Dates 1/57/26-02/25/53 Re-enrollment is required 30 days prior   Application ID # [id number]  The patient wanted to follow up and and state that the patient's financial status has not improved

## 2020-07-07 NOTE — Telephone Encounter (Signed)
Attempted to call back patient. Unable to reach

## 2020-07-09 ENCOUNTER — Telehealth: Payer: Self-pay | Admitting: Internal Medicine

## 2020-07-09 NOTE — Telephone Encounter (Signed)
Left message for patient to call back  

## 2020-07-11 ENCOUNTER — Encounter: Payer: Self-pay | Admitting: Internal Medicine

## 2020-07-11 DIAGNOSIS — G4700 Insomnia, unspecified: Secondary | ICD-10-CM | POA: Insufficient documentation

## 2020-07-11 DIAGNOSIS — J84112 Idiopathic pulmonary fibrosis: Secondary | ICD-10-CM | POA: Insufficient documentation

## 2020-07-11 NOTE — Assessment & Plan Note (Signed)
Likely combination of depression/ anxiety, poor sleep hygiene, and possibly organic. Plan- try Doxepin. Eventually consider Belsomra

## 2020-07-11 NOTE — Assessment & Plan Note (Signed)
Discussed Plan- repeat HRCT in June to assess progression

## 2020-07-13 NOTE — Telephone Encounter (Signed)
ATC pt, vm box is full. WCB.

## 2020-07-22 NOTE — Telephone Encounter (Signed)
ATC pt. Unable to leave VM. Will try once more before closing encounter.

## 2020-07-27 NOTE — Telephone Encounter (Signed)
ATC pt. Unable to leave VM. Will close encounter as we have attempted to reach pt several times without a call back number.

## 2020-07-29 ENCOUNTER — Ambulatory Visit: Payer: Medicare Other | Admitting: Internal Medicine

## 2020-08-20 ENCOUNTER — Telehealth: Payer: Self-pay | Admitting: Internal Medicine

## 2020-08-20 DIAGNOSIS — H04123 Dry eye syndrome of bilateral lacrimal glands: Secondary | ICD-10-CM | POA: Diagnosis not present

## 2020-08-20 NOTE — Telephone Encounter (Signed)
New Message:    Patient is on Nicole [name]. She wants to know if you think [employer] Well Foundation would help her with her Repatha or any other Patient Assistance Program?

## 2020-08-20 NOTE — Telephone Encounter (Signed)
Attempted to call patient on both listed numbers. Left voicemail for pt to return office call.

## 2020-08-25 NOTE — Telephone Encounter (Signed)
Attempted to contact patient on both numbers listed in chart. Unable to leave a message on either number, will try again at a later time.

## 2020-08-25 NOTE — Telephone Encounter (Signed)
[place]:    Patient is calling back to see if any help is available for her Repatha. She says she need some right away, she does not have any.

## 2020-08-26 NOTE — Telephone Encounter (Signed)
Nicole [name] is returning [employer] call. Please advise.

## 2020-08-26 NOTE — Telephone Encounter (Signed)
LM2CB 

## 2020-09-06 ENCOUNTER — Telehealth: Payer: Self-pay | Admitting: Internal Medicine

## 2020-09-06 NOTE — Telephone Encounter (Signed)
Tried to call patient at both phone #'s listed in chart [phone] is a non working #. [phone] mailbox full unable to leave a message.

## 2020-09-06 NOTE — Telephone Encounter (Signed)
Patient called and asked to speak to Dr. Lysbeth Penner Nurse

## 2020-09-06 NOTE — Telephone Encounter (Signed)
Spoke with patient from direct transfer from operator. Patient states she cannot afford Repatha and is asking for assistance. Co-pay is $47/month. Explained healthwell foundation is currently closed for funds. Encouraged patient apply for medicare LIS (extra help) and provided phone #. Explained that if denied, she may can qualify for assistance from [employer] (drug company). Provided Repatha sample x1 for her to pick up

## 2020-09-07 ENCOUNTER — Encounter: Payer: Self-pay | Admitting: Internal Medicine

## 2020-09-07 NOTE — Assessment & Plan Note (Addendum)
Mild Plan- pending f/u CT. Discussed possible intervention with antifibrotics if progressive

## 2020-09-07 NOTE — Assessment & Plan Note (Signed)
Realizes relation to depression after husband's death Plan- Belsomra

## 2020-09-21 ENCOUNTER — Other Ambulatory Visit: Payer: Self-pay | Admitting: Family Medicine

## 2020-09-21 DIAGNOSIS — Z1231 Encounter for screening mammogram for malignant neoplasm of breast: Secondary | ICD-10-CM

## 2020-09-24 DIAGNOSIS — H04123 Dry eye syndrome of bilateral lacrimal glands: Secondary | ICD-10-CM | POA: Diagnosis not present

## 2020-10-11 DIAGNOSIS — L821 Other seborrheic keratosis: Secondary | ICD-10-CM | POA: Diagnosis not present

## 2020-10-11 DIAGNOSIS — D225 Melanocytic nevi of trunk: Secondary | ICD-10-CM | POA: Diagnosis not present

## 2020-10-11 DIAGNOSIS — L570 Actinic keratosis: Secondary | ICD-10-CM | POA: Diagnosis not present

## 2020-10-11 DIAGNOSIS — L814 Other melanin hyperpigmentation: Secondary | ICD-10-CM | POA: Diagnosis not present

## 2020-10-18 ENCOUNTER — Telehealth: Payer: Self-pay | Admitting: Internal Medicine

## 2020-10-18 ENCOUNTER — Other Ambulatory Visit: Payer: Self-pay | Admitting: Internal Medicine

## 2020-10-18 DIAGNOSIS — E785 Hyperlipidemia, unspecified: Secondary | ICD-10-CM

## 2020-10-18 NOTE — Telephone Encounter (Signed)
Opened in error

## 2020-10-25 ENCOUNTER — Ambulatory Visit (HOSPITAL_BASED_OUTPATIENT_CLINIC_OR_DEPARTMENT_OTHER): Payer: Medicare Other | Admitting: Internal Medicine

## 2020-11-01 ENCOUNTER — Ambulatory Visit
Admission: RE | Admit: 2020-11-01 | Discharge: 2020-11-01 | Disposition: A | Discharge status: Home / Self Care | Payer: Medicare Other | Source: Ambulatory Visit | Attending: Family Medicine | Admitting: Family Medicine

## 2020-11-01 ENCOUNTER — Other Ambulatory Visit: Payer: Self-pay

## 2020-11-01 DIAGNOSIS — Z1231 Encounter for screening mammogram for malignant neoplasm of breast: Secondary | ICD-10-CM | POA: Diagnosis not present

## 2020-11-11 ENCOUNTER — Telehealth: Payer: Self-pay | Admitting: Internal Medicine

## 2020-11-11 DIAGNOSIS — E785 Hyperlipidemia, unspecified: Secondary | ICD-10-CM

## 2020-11-11 NOTE — Telephone Encounter (Signed)
Pt c/o medication issue:  1. Name of Medication:  Bio Nutrition Cholesterol Wellness Vegi-Caps  2. How are you currently taking this medication (dosage and times per day)?   3. Are you having a reaction (difficulty breathing--STAT)?   4. What is your medication issue?  Patient states she started on this supplement to maintain her cholesterol due to being unable to afford Repatha. She would like a new order for a lipid panel to check her cholesterol after being on Bio Nutrition Cholesterol Wellness capsules.

## 2020-11-11 NOTE — Telephone Encounter (Signed)
Ok to repeat regular lipid profile  Dr Lemmie Evens

## 2020-11-11 NOTE — Telephone Encounter (Signed)
Tried to call patient, no answer.

## 2020-11-17 NOTE — Telephone Encounter (Signed)
Attempted to call patient, cell phone is not a working number. No answer and no voice mail on home phone.

## 2020-11-23 NOTE — Telephone Encounter (Signed)
Attempted to reach the patient. Was unable to leave a message. Encounter closed.

## 2020-12-02 ENCOUNTER — Other Ambulatory Visit: Payer: Self-pay

## 2020-12-02 DIAGNOSIS — E785 Hyperlipidemia, unspecified: Secondary | ICD-10-CM

## 2020-12-02 NOTE — Telephone Encounter (Signed)
Patient is following up. She states she got a new #, [id number].

## 2020-12-02 NOTE — Telephone Encounter (Signed)
Contacted patient, advised of message below that it was okay to have a LIPID completed.  I reordered.  And patient will come have this completed.   Thanks!

## 2020-12-08 ENCOUNTER — Other Ambulatory Visit: Payer: Self-pay

## 2020-12-08 DIAGNOSIS — E785 Hyperlipidemia, unspecified: Secondary | ICD-10-CM | POA: Diagnosis not present

## 2020-12-09 LAB — LIPID PANEL
Chol/HDL Ratio: 4.6 ratio — ABNORMAL HIGH (ref 0.0–4.4)
Cholesterol, Total: 205 mg/dL — ABNORMAL HIGH (ref 100–199)
HDL: 45 mg/dL (ref >39)
LDL Chol Calc (NIH): 129 mg/dL — ABNORMAL HIGH (ref 0–99)
Triglycerides: 176 mg/dL — ABNORMAL HIGH (ref 0–149)
VLDL Cholesterol Cal: 31 mg/dL (ref 5–40)

## 2020-12-13 ENCOUNTER — Telehealth: Payer: Self-pay | Admitting: Internal Medicine

## 2020-12-13 NOTE — Telephone Encounter (Signed)
Thanks .. 6 months. Give her time to start up on therapy.  -Mali

## 2020-12-13 NOTE — Telephone Encounter (Signed)
Patient was returning call for results 

## 2020-12-13 NOTE — Telephone Encounter (Signed)
Nicole Casino, MD  12/09/2020  9:17 AM EST     Cholesterol is up across the board   Dr Luciano Cutter with patient about lab results. She has not been taking Repatha and has been taking a herbal supplement.   Explained to patient she is approved for a healthwell grant -- tried to enroll her again and was notified she has an active grant expiring 05/09/21. She said she was not aware of this, but per chart review, this was discussed in May 2022.   Provided her with the info from the grant fund that she needs to take to pharmacy   Asked that she let me know if everything goes OK with getting Rx filled and then will determine when she needs repeat labs.   She verbalized understanding.

## 2020-12-14 NOTE — Telephone Encounter (Signed)
Follow Up:    Patient said she talked to [place] yesterday and she gave her the bin# and RX#-for her Repatha. She need those again today please

## 2020-12-14 NOTE — Telephone Encounter (Signed)
Spoke with patient and provided her with healthwell approval info

## 2020-12-15 ENCOUNTER — Telehealth: Payer: Self-pay | Admitting: Internal Medicine

## 2020-12-15 NOTE — Telephone Encounter (Signed)
Spoke with pt, aware she will need to restart the medications and then recheck her labs in 6 months before any changes will be made to the current prescription.

## 2020-12-15 NOTE — Telephone Encounter (Signed)
Pt is wanting to know if she should be taking a higher dosage of Repatha or taking it more frequently due to recent lab results

## 2020-12-25 ENCOUNTER — Other Ambulatory Visit: Payer: Self-pay | Admitting: Internal Medicine

## 2020-12-30 ENCOUNTER — Telehealth: Payer: Self-pay | Admitting: Internal Medicine

## 2020-12-31 ENCOUNTER — Ambulatory Visit
Admission: RE | Admit: 2020-12-31 | Discharge: 2020-12-31 | Disposition: A | Discharge status: Home / Self Care | Payer: Medicare Other | Source: Ambulatory Visit | Attending: Internal Medicine | Admitting: Internal Medicine

## 2020-12-31 ENCOUNTER — Other Ambulatory Visit: Payer: Self-pay

## 2020-12-31 DIAGNOSIS — J479 Bronchiectasis, uncomplicated: Secondary | ICD-10-CM | POA: Diagnosis not present

## 2020-12-31 DIAGNOSIS — J849 Interstitial pulmonary disease, unspecified: Secondary | ICD-10-CM

## 2020-12-31 DIAGNOSIS — J84112 Idiopathic pulmonary fibrosis: Secondary | ICD-10-CM | POA: Diagnosis not present

## 2020-12-31 DIAGNOSIS — I2510 Atherosclerotic heart disease of native coronary artery without angina pectoris: Secondary | ICD-10-CM | POA: Diagnosis not present

## 2020-12-31 DIAGNOSIS — I700 Atherosclerosis of aorta: Secondary | ICD-10-CM | POA: Diagnosis not present

## 2020-12-31 NOTE — Telephone Encounter (Signed)
Called patient but she did not answer. Per patient's chart, it appears she went ahead and had the CT scan. Left message for her to call us back on Monday morning.

## 2021-01-04 NOTE — Progress Notes (Signed)
Called pt and there was no answer, LMTCB.  

## 2021-01-04 NOTE — Telephone Encounter (Signed)
Patient is returning phone call. Patient phone number is [phone].

## 2021-01-04 NOTE — Telephone Encounter (Signed)
Spoke with pt and reviewed CT results as dictated by Dr. Annamaria Boots. Pt stated understanding. Nothing further needed at this time.

## 2021-01-05 ENCOUNTER — Telehealth: Payer: Self-pay | Admitting: Internal Medicine

## 2021-01-05 NOTE — Telephone Encounter (Signed)
Request Reference Number: [id number]. REPATHA SURE INJ 140MG /ML is approved through 01/22/2022

## 2021-01-05 NOTE — Telephone Encounter (Signed)
PA for repatha sureclick submitted via CMM (Key: [id number])

## 2021-01-07 ENCOUNTER — Encounter: Payer: Self-pay | Admitting: Internal Medicine

## 2021-01-07 ENCOUNTER — Other Ambulatory Visit: Payer: Self-pay

## 2021-01-07 ENCOUNTER — Ambulatory Visit: Payer: Medicare Other | Admitting: Internal Medicine

## 2021-01-07 DIAGNOSIS — J84112 Idiopathic pulmonary fibrosis: Secondary | ICD-10-CM | POA: Diagnosis not present

## 2021-01-07 DIAGNOSIS — R9389 Abnormal findings on diagnostic imaging of other specified body structures: Secondary | ICD-10-CM | POA: Diagnosis not present

## 2021-01-07 NOTE — Patient Instructions (Signed)
Order- schedule HRCT in 6 months     dx UIP, lung nodules  Please call if we can help

## 2021-01-07 NOTE — Assessment & Plan Note (Signed)
We reviewed HRCT from 01/02/2021 discussed key criteria and of progressive interstitial lung disease.  For now we will continue observation.
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
## 2021-01-07 NOTE — Assessment & Plan Note (Signed)
Left upper lobe nodular densities may be atypical infection.  I discussed radiology report and we reviewed CT scan images. Plan-repeat chest CT in 6 months.

## 2021-01-09 ENCOUNTER — Other Ambulatory Visit: Payer: Self-pay | Admitting: Internal Medicine

## 2021-01-12 DIAGNOSIS — E039 Hypothyroidism, unspecified: Secondary | ICD-10-CM | POA: Diagnosis not present

## 2021-01-12 DIAGNOSIS — K219 Gastro-esophageal reflux disease without esophagitis: Secondary | ICD-10-CM | POA: Diagnosis not present

## 2021-01-12 DIAGNOSIS — I779 Disorder of arteries and arterioles, unspecified: Secondary | ICD-10-CM | POA: Diagnosis not present

## 2021-01-12 DIAGNOSIS — Z0000 Encounter for general adult medical examination without abnormal findings: Secondary | ICD-10-CM | POA: Diagnosis not present

## 2021-01-12 DIAGNOSIS — J8410 Pulmonary fibrosis, unspecified: Secondary | ICD-10-CM | POA: Diagnosis not present

## 2021-01-12 DIAGNOSIS — M810 Age-related osteoporosis without current pathological fracture: Secondary | ICD-10-CM | POA: Diagnosis not present

## 2021-01-12 DIAGNOSIS — E559 Vitamin D deficiency, unspecified: Secondary | ICD-10-CM | POA: Diagnosis not present

## 2021-01-12 DIAGNOSIS — D692 Other nonthrombocytopenic purpura: Secondary | ICD-10-CM | POA: Diagnosis not present

## 2021-01-12 DIAGNOSIS — I129 Hypertensive chronic kidney disease with stage 1 through stage 4 chronic kidney disease, or unspecified chronic kidney disease: Secondary | ICD-10-CM | POA: Diagnosis not present

## 2021-01-12 DIAGNOSIS — Z1389 Encounter for screening for other disorder: Secondary | ICD-10-CM | POA: Diagnosis not present

## 2021-01-12 DIAGNOSIS — Z23 Encounter for immunization: Secondary | ICD-10-CM | POA: Diagnosis not present

## 2021-01-12 DIAGNOSIS — E7800 Pure hypercholesterolemia, unspecified: Secondary | ICD-10-CM | POA: Diagnosis not present

## 2021-01-12 DIAGNOSIS — I872 Venous insufficiency (chronic) (peripheral): Secondary | ICD-10-CM | POA: Diagnosis not present

## 2021-01-13 ENCOUNTER — Other Ambulatory Visit: Payer: Self-pay | Admitting: Family Medicine

## 2021-01-13 DIAGNOSIS — I779 Disorder of arteries and arterioles, unspecified: Secondary | ICD-10-CM

## 2021-02-01 ENCOUNTER — Other Ambulatory Visit: Payer: Medicare Other

## 2021-02-02 ENCOUNTER — Ambulatory Visit
Admission: RE | Admit: 2021-02-02 | Discharge: 2021-02-02 | Disposition: A | Discharge status: Home / Self Care | Payer: Medicare Other | Source: Ambulatory Visit | Attending: Family Medicine | Admitting: Family Medicine

## 2021-02-02 DIAGNOSIS — I739 Peripheral vascular disease, unspecified: Secondary | ICD-10-CM | POA: Diagnosis not present

## 2021-02-02 DIAGNOSIS — I779 Disorder of arteries and arterioles, unspecified: Secondary | ICD-10-CM

## 2021-02-02 DIAGNOSIS — Z1211 Encounter for screening for malignant neoplasm of colon: Secondary | ICD-10-CM | POA: Diagnosis not present

## 2021-02-17 NOTE — Addendum Note (Signed)
Addended by: Elby Beck R on: 02/17/2021 02:07 PM   Modules accepted: Orders

## 2021-02-27 DIAGNOSIS — M79644 Pain in right finger(s): Secondary | ICD-10-CM | POA: Diagnosis not present

## 2021-03-23 ENCOUNTER — Other Ambulatory Visit: Payer: Self-pay | Admitting: *Deleted

## 2021-03-23 ENCOUNTER — Other Ambulatory Visit: Payer: Self-pay | Admitting: Internal Medicine

## 2021-03-23 DIAGNOSIS — E785 Hyperlipidemia, unspecified: Secondary | ICD-10-CM

## 2021-03-23 NOTE — Telephone Encounter (Signed)
Lab order mailed to be done ~ May 2023 ?

## 2021-04-01 DIAGNOSIS — N8110 Cystocele, unspecified: Secondary | ICD-10-CM | POA: Diagnosis not present

## 2021-04-12 DIAGNOSIS — D492 Neoplasm of unspecified behavior of bone, soft tissue, and skin: Secondary | ICD-10-CM | POA: Diagnosis not present

## 2021-04-12 DIAGNOSIS — L570 Actinic keratosis: Secondary | ICD-10-CM | POA: Diagnosis not present

## 2021-04-12 DIAGNOSIS — L821 Other seborrheic keratosis: Secondary | ICD-10-CM | POA: Diagnosis not present

## 2021-04-12 DIAGNOSIS — L853 Xerosis cutis: Secondary | ICD-10-CM | POA: Diagnosis not present

## 2021-04-14 DIAGNOSIS — E039 Hypothyroidism, unspecified: Secondary | ICD-10-CM | POA: Diagnosis not present

## 2021-04-15 DIAGNOSIS — N8110 Cystocele, unspecified: Secondary | ICD-10-CM | POA: Diagnosis not present

## 2021-04-19 DIAGNOSIS — R195 Other fecal abnormalities: Secondary | ICD-10-CM | POA: Diagnosis not present

## 2021-04-20 ENCOUNTER — Telehealth: Payer: Self-pay | Admitting: Internal Medicine

## 2021-04-20 NOTE — Telephone Encounter (Signed)
Calling to see if nurse is still handing her grant for her medication. Please advise ?

## 2021-04-20 NOTE — Telephone Encounter (Signed)
Called no answer ,Left message for patient on voicemail -  question will be forward to Dr [facility] nurse to address. ?

## 2021-04-24 DIAGNOSIS — R509 Fever, unspecified: Secondary | ICD-10-CM | POA: Diagnosis not present

## 2021-04-24 DIAGNOSIS — Z03818 Encounter for observation for suspected exposure to other biological agents ruled out: Secondary | ICD-10-CM | POA: Diagnosis not present

## 2021-04-24 DIAGNOSIS — J069 Acute upper respiratory infection, unspecified: Secondary | ICD-10-CM | POA: Diagnosis not present

## 2021-04-24 DIAGNOSIS — R519 Headache, unspecified: Secondary | ICD-10-CM | POA: Diagnosis not present

## 2021-04-25 NOTE — Telephone Encounter (Signed)
Spoke with patient. Advised will renew her healthwell grant.  ? ?HEALTHWELL ID ?[id number] ?  ?PATIENT ?Nicole [name] ?  ?STATUS  ?Renewal ?  ?START DATE ?05/10/2021 ?  ?END DATE ?05/10/2022 ?  ?ASSISTANCE TYPE ?Co-pay ?  ?BALANCE ?$2500.00 ?Pharmacy Card ? ?CARD NO. ?[id number] ? ?BIN ?[id number] ?  ?PCN ?[id number] ?  ?PC GROUP ?[id number] ?  ?HELP DESK ?[phone] ?  ?PROVIDER ?PDMI ?  ?PROCESSOR ?PDMI ?

## 2021-04-26 DIAGNOSIS — N3000 Acute cystitis without hematuria: Secondary | ICD-10-CM | POA: Diagnosis not present

## 2021-04-26 DIAGNOSIS — N8110 Cystocele, unspecified: Secondary | ICD-10-CM | POA: Diagnosis not present

## 2021-04-26 DIAGNOSIS — R8271 Bacteriuria: Secondary | ICD-10-CM | POA: Diagnosis not present

## 2021-04-26 DIAGNOSIS — N139 Obstructive and reflux uropathy, unspecified: Secondary | ICD-10-CM | POA: Diagnosis not present

## 2021-04-27 DIAGNOSIS — L270 Generalized skin eruption due to drugs and medicaments taken internally: Secondary | ICD-10-CM | POA: Diagnosis not present

## 2021-04-27 DIAGNOSIS — L298 Other pruritus: Secondary | ICD-10-CM | POA: Diagnosis not present

## 2021-04-27 DIAGNOSIS — L570 Actinic keratosis: Secondary | ICD-10-CM | POA: Diagnosis not present

## 2021-05-10 DIAGNOSIS — L503 Dermatographic urticaria: Secondary | ICD-10-CM | POA: Diagnosis not present

## 2021-05-10 DIAGNOSIS — E785 Hyperlipidemia, unspecified: Secondary | ICD-10-CM | POA: Diagnosis not present

## 2021-05-11 LAB — LIPID PANEL
Chol/HDL Ratio: 2.8 ratio (ref 0.0–4.4)
Cholesterol, Total: 154 mg/dL (ref 100–199)
HDL: 55 mg/dL (ref >39)
LDL Chol Calc (NIH): 74 mg/dL (ref 0–99)
Triglycerides: 142 mg/dL (ref 0–149)
VLDL Cholesterol Cal: 25 mg/dL (ref 5–40)

## 2021-05-12 ENCOUNTER — Encounter: Payer: Self-pay | Admitting: Internal Medicine

## 2021-05-12 ENCOUNTER — Ambulatory Visit: Payer: Medicare Other | Admitting: Internal Medicine

## 2021-05-24 ENCOUNTER — Other Ambulatory Visit: Payer: Self-pay | Admitting: Internal Medicine

## 2021-06-21 ENCOUNTER — Telehealth: Payer: Self-pay | Admitting: Adult Health

## 2021-06-21 NOTE — Telephone Encounter (Signed)
Pt hasn't been seen in 2.5 years.

## 2021-06-21 NOTE — Telephone Encounter (Signed)
Pt called and LVM requesting a accomodation note for work regarding her foot. Pt is needing to be able to sit on occasions. Please advise.

## 2021-06-21 NOTE — Telephone Encounter (Signed)
Looks like she will now be seen by Dr Krista Blue on 06/23/21. Can address letter request then.

## 2021-06-22 ENCOUNTER — Other Ambulatory Visit: Payer: Self-pay | Admitting: Internal Medicine

## 2021-06-23 ENCOUNTER — Ambulatory Visit (INDEPENDENT_AMBULATORY_CARE_PROVIDER_SITE_OTHER): Payer: Medicare Other | Admitting: Neurology

## 2021-06-23 VITALS — BP 155/76 | HR 50 | Ht 63.0 in | Wt 98.0 lb

## 2021-06-23 DIAGNOSIS — M6281 Muscle weakness (generalized): Secondary | ICD-10-CM | POA: Diagnosis not present

## 2021-06-23 DIAGNOSIS — R350 Frequency of micturition: Secondary | ICD-10-CM | POA: Diagnosis not present

## 2021-06-23 DIAGNOSIS — R202 Paresthesia of skin: Secondary | ICD-10-CM | POA: Diagnosis not present

## 2021-06-23 MED ORDER — PREGABALIN 50 MG PO CAPS: 50.0000 mg | ORAL_CAPSULE | Freq: Three times a day (TID) | ORAL | 5 refills | Status: DC

## 2021-06-23 NOTE — Progress Notes (Signed)
Chief Complaint  Patient presents with   Follow-up    Rm 13, alone  Pt states she would to discuss Paresthesia treatment       ASSESSMENT AND PLAN  Nicole [name] is a 81 y.o. female   Bilateral lower extremity paresthesia  Previously tried gabapentin, Cymbalta, local cream with limited help,  Will try Lyrica, 50 mg titrating to 3 tablets every night.      DIAGNOSTIC DATA (LABS, IMAGING, TESTING) - I reviewed patient records, labs, notes, testing and imaging myself where available.   MEDICAL HISTORY:  Nicole [name] is a 81 year old female, previously patient of Dr. Jannifer Franklin, return to evaluation for bilateral lower extremity paresthesia, her primary care physician is Dr. Laurann Montana, Margaretha Sheffield,    I reviewed and summarized the referring note.  Past medical history Prediabetes, was under good control with weight loss and tired,  She was seen by Dr. Jannifer Franklin since 2019 for evaluation of few years history of gradual onset numbness in her feet, she describes discomfort when bearing weight, but most bothersome symptoms is at night when she tried to go to sleep, she has difficulty finding a comfortable position, bilateral lower extremity numbness tingling deep achy sensation, she was given gabapentin up to 300 mg twice a day without much benefit  EMG nerve conduction study by Dr. 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, requires standing on her feet for prolonged period of time, she described  bilateral foot wound burning sensation after prolonged standing, she denies difficulty walking, walk around the park regularly about 1 mile without any difficulty, she denies significant low back pain, no bilateral lower extremity paresthesia  She does have bladder prolapse, complains of bladder frequency, urgency,  Nighttime she complains of difficulty falling to sleep because bilateral lower extremity achy numbing sensation, but did not have the urge to pace around, she has tried Capzasin and lidocaine cream with limited help   Evaluation in March 2022 showed normal negative ANA, ESR, and disco derma antibody, Sjogren antibody, ANCA, rheumatoid factor, Vit D.  PHYSICAL EXAM:   Vitals:   06/23/21 1326  BP: (!) 155/76  Pulse: (!) 50  Weight: 98 lb (44.5 kg)  Height: 5' 3"  (1.6 m)   Not recorded     Body mass index is 17.36 kg/m.  PHYSICAL EXAMNIATION:  Gen: NAD, conversant, well nourised, well groomed                     Cardiovascular: Regular rate rhythm, no peripheral edema, warm, nontender. Eyes: Conjunctivae clear without exudates or hemorrhage Neck: Supple, no carotid bruits. Pulmonary: Clear to auscultation bilaterally   NEUROLOGICAL EXAM:  MENTAL STATUS: Speech/cognition: Awake, alert, oriented to history taking and casual conversation CRANIAL NERVES: CN II: Visual fields are full to confrontation. Pupils are round equal and briskly reactive to light. CN III, IV, VI: extraocular movement are normal. No ptosis. CN V: Facial sensation is intact to light touch CN VII: Face is symmetric with normal eye closure  CN VIII: Hearing is normal to causal conversation.  CN IX, X: Phonation is normal. CN XI: Head turning and shoulder shrug are intact  MOTOR: There is no pronator drift of out-stretched arms. Muscle bulk and tone are normal. Muscle strength is normal.  REFLEXES: Reflexes are 1 and symmetric at the biceps, triceps, knees, and ankles. Plantar responses are  flexor.  SENSORY: Decreased vibratory sensation at toes, preserved to pinprick and light touch.  COORDINATION: There is no trunk or limb dysmetria noted.  GAIT/STANCE: He needs push-up to get up from seated position, standing  REVIEW OF SYSTEMS:  Full 14 system review of systems performed and notable only for as above All other review of systems were negative.   ALLERGIES: Allergies  Allergen Reactions   Keppra [Levetiracetam]     Depression   Rosuvastatin Calcium Diarrhea    Muscle aches   Statins Other (See Comments)    Muscle aches   Lamictal [Lamotrigine] Rash    HOME MEDICATIONS: Current Outpatient Medications  Medication Sig Dispense Refill   amLODipine (NORVASC) 5 MG tablet Take 1 tablet (5 mg total) by mouth daily. Patient must keep appointment for future refill 90 tablet 0   Calcium Carbonate-Vitamin D (CALCIUM-VITAMIN D3 PO) Take 15 mLs by mouth 2 (two) times daily.     CINNAMON PO Take 1 capsule by mouth 2 (two) times daily.     clonazePAM (KLONOPIN) 1 MG tablet Take 0.5 mg by mouth 2 (two) times daily as needed for anxiety.     denosumab (PROLIA) 60 MG/ML SOSY injection Inject 60 mg into the skin every 6 (six) months.     HYDROcodone-acetaminophen (NORCO/VICODIN) 5-325 MG tablet Take 1 tablet by mouth every 6 (six) hours as needed for up to 15 doses for severe pain. 15 tablet 0   levothyroxine (SYNTHROID) 88 MCG tablet Take 88 mcg by mouth daily.     MILK THISTLE PO Take 1 capsule by mouth 2 (two) times daily.     Multiple Vitamin (MULTIVITAMIN WITH MINERALS) TABS tablet Take 1 tablet by mouth daily.     NONFORMULARY OR COMPOUNDED ITEM [facility]:  Peripheral Neuropathy cream - Bupivacaine 1%, Doxepin 3%, Gabapentin 6%, Pentoxifylline 3%, Topiramate 1%, apply 1-2 grams to affected area 3-4 times daily. 100 each 5   olmesartan (BENICAR) 40 MG tablet olmesartan 40 mg tablet  TAKE 1 TABLET BY MOUTH EVERY DAY     omeprazole (PRILOSEC) 20 MG capsule Take 20 mg  by mouth as needed.      REPATHA SURECLICK 007 MG/ML SOAJ INJECT 1 DOSE INTO THE SKIN EVERY 14 DAYS 6 mL 3   Suvorexant (BELSOMRA) 20 MG TABS 1 for sleep 30 tablet 5   No current facility-administered medications for this visit.    PAST MEDICAL HISTORY: Past Medical History:  Diagnosis Date   Anxiety    Arthritis    Cancer ([place])    Basal cell   GERD (gastroesophageal reflux disease)    HTN (hypertension)    Hyperlipemia    Hypothyroidism    Increased liver enzymes    PONV (postoperative nausea and vomiting)     PAST SURGICAL HISTORY: Past Surgical History:  Procedure Laterality Date   BREAST BIOPSY Left 12/28/2010   BREAST EXCISIONAL BIOPSY Left 12/28/2010   BREAST SURGERY Bilateral    reduction over 20 years   CARDIOVASCULAR STRESS TEST  05/1998   breast attenuation in anterior septal wall, EF 63%   CHOLECYSTECTOMY     FOOT SURGERY     HIP SURGERY  REDUCTION MAMMAPLASTY Bilateral    over 20 years ago   REVERSE SHOULDER ARTHROPLASTY Left 05/12/2016   Procedure: LEFT REVERSE TOTAL SHOULDER ARTHROPLASTY;  Surgeon: Netta Cedars, MD;  Location: [facility];  Service: Orthopedics;  Laterality: Left;    FAMILY HISTORY: Family History  Problem Relation Age of Onset   Depression Mother        stroke   Drug abuse Daughter    Depression Daughter    Heart attack Brother 54    SOCIAL HISTORY: Social History   Socioeconomic History   Marital status: Widowed    Spouse name: Not on file   Number of children: 2   Years of education: 46   Highest education level: Not on file  Occupational History   Occupation: Sales    Employer: [employer]  Tobacco Use   Smoking status: Never   Smokeless tobacco: Never  Vaping Use   Vaping Use: Never used  Substance and Sexual Activity   Alcohol use: Yes    Alcohol/week: 2.0 standard drinks    Types: 2 Glasses of wine per week    Comment: occasionally   Drug use: No   Sexual activity: Never  Other Topics Concern   Not on file  Social  History Narrative   Lives at home with husband   Right handed    1 cup of Caffeine daily     Social Determinants of Health   Financial Resource Strain: Not on file  Food Insecurity: Not on file  Transportation Needs: Not on file  Physical Activity: Not on file  Stress: Not on file  Social Connections: Not on file  Intimate Partner Violence: Not on file      Marcial Pacas, M.D. Ph.D.  [facility] [address], [place], [place] [address] Ph: [phone] Fax: [phone]  CC:  Kelton Pillar, MD [address] [place],  [place] [id number]  Kelton Pillar, MD

## 2021-07-05 ENCOUNTER — Telehealth: Payer: Self-pay | Admitting: Internal Medicine

## 2021-07-05 DIAGNOSIS — N8110 Cystocele, unspecified: Secondary | ICD-10-CM | POA: Diagnosis not present

## 2021-07-05 NOTE — Telephone Encounter (Signed)
Will forward to nurse 

## 2021-07-05 NOTE — Telephone Encounter (Signed)
Attempted to call patient. Phone rang w/no answer  See 04/20/21 note for healthwell approval info

## 2021-07-05 NOTE — Telephone Encounter (Signed)
Py wanting nurse Eliezer Lofts) to give her a call back regarding [employer]. Please advise

## 2021-07-08 ENCOUNTER — Telehealth: Payer: Self-pay | Admitting: Internal Medicine

## 2021-07-08 NOTE — Telephone Encounter (Signed)
Patient called wants nurse Eliezer Lofts) to give her a call back regarding [employer]. Please advise

## 2021-07-08 NOTE — Telephone Encounter (Signed)
Left message for patient to call back  

## 2021-07-08 NOTE — Telephone Encounter (Signed)
Spoke with patient. She said pharmacy does not have her new healthwell grant info. Advised this was approved 04/2021. Advised will call [facility] and mail letter to her with ID info.   Called [facility] and pharmacy already called [employer] yesterday and obtained necessary info.   No further assistance needed

## 2021-07-12 DIAGNOSIS — N8110 Cystocele, unspecified: Secondary | ICD-10-CM | POA: Diagnosis not present

## 2021-07-12 DIAGNOSIS — R350 Frequency of micturition: Secondary | ICD-10-CM | POA: Diagnosis not present

## 2021-07-12 DIAGNOSIS — M6281 Muscle weakness (generalized): Secondary | ICD-10-CM | POA: Diagnosis not present

## 2021-07-18 ENCOUNTER — Other Ambulatory Visit: Payer: Medicare Other

## 2021-07-25 ENCOUNTER — Telehealth: Payer: Self-pay | Admitting: *Deleted

## 2021-07-25 NOTE — Telephone Encounter (Signed)
Received [employer] paperwork from her employer. Left patient a message for clarification of needs.  Per last visit on 06/23/21:  She returns today continue complains of bilateral foot discomfort, she work part-time job at [employer] improvement, requires standing on her feet for prolonged period of time, she described bilateral foot wound burning sensation after prolonged standing, she denies difficulty walking, walk around the park regularly about 1 mile without any difficulty, she denies significant low back pain, no bilateral lower extremity paresthesia.

## 2021-07-25 NOTE — Telephone Encounter (Signed)
Left second message to return our call. Provided our office holiday hours.

## 2021-07-27 ENCOUNTER — Telehealth: Payer: Self-pay | Admitting: *Deleted

## 2021-07-27 NOTE — Telephone Encounter (Signed)
[employer] form faxed on 07/27/2021

## 2021-07-27 NOTE — Telephone Encounter (Signed)
I spoke to the patient. She runs the self-checkout line, at [employer], part-time. She is asking for a chair or stool to be placed at her work site to allow her to sit intermittently during her shift. She is able to get up to provide customers assistance. Paperwork completed and return to medical records.

## 2021-07-28 ENCOUNTER — Other Ambulatory Visit: Payer: Medicare Other

## 2021-08-09 ENCOUNTER — Other Ambulatory Visit: Payer: Self-pay | Admitting: Internal Medicine

## 2021-08-12 DIAGNOSIS — R3914 Feeling of incomplete bladder emptying: Secondary | ICD-10-CM | POA: Diagnosis not present

## 2021-08-12 DIAGNOSIS — N8110 Cystocele, unspecified: Secondary | ICD-10-CM | POA: Diagnosis not present

## 2021-08-12 DIAGNOSIS — R350 Frequency of micturition: Secondary | ICD-10-CM | POA: Diagnosis not present

## 2021-08-22 ENCOUNTER — Telehealth: Payer: Self-pay | Admitting: Pharmacy Technician

## 2021-08-22 ENCOUNTER — Other Ambulatory Visit: Payer: Self-pay

## 2021-08-22 DIAGNOSIS — M810 Age-related osteoporosis without current pathological fracture: Secondary | ICD-10-CM | POA: Insufficient documentation

## 2021-08-22 NOTE — Telephone Encounter (Signed)
Auth Submission: no auth needed Payer: [employer] Medication & CPT/J Code(s) submitted: Reclast (Zolendronic acid) [id number] Route of submission (phone, fax, portal): PORTAL Auth type: Buy/Bill Units/visits requested: 1 Reference number: [id number] Approval from: 08/22/21 to 01/22/22

## 2021-08-24 ENCOUNTER — Telehealth: Payer: Self-pay | Admitting: Internal Medicine

## 2021-08-24 NOTE — Telephone Encounter (Signed)
Patient called. She wanted to check up on MyChart messages previously sent. Explained that her grant is good until 04/2022. No further assistance needed.

## 2021-08-24 NOTE — Telephone Encounter (Signed)
New Message:    Patient says she was finally able to get in her My Chart. She had some old messages. She would like for Jenna E. To please give her a call, concerning her Nicole [name]

## 2021-08-25 ENCOUNTER — Inpatient Hospital Stay: Admission: RE | Admit: 2021-08-25 | Payer: Medicare Other | Source: Ambulatory Visit

## 2021-09-07 ENCOUNTER — Ambulatory Visit: Payer: Medicare Other

## 2021-09-08 ENCOUNTER — Other Ambulatory Visit: Payer: Medicare Other

## 2021-09-08 ENCOUNTER — Ambulatory Visit
Admission: RE | Admit: 2021-09-08 | Discharge: 2021-09-08 | Disposition: A | Discharge status: Home / Self Care | Payer: Medicare Other | Source: Ambulatory Visit | Attending: Internal Medicine | Admitting: Internal Medicine

## 2021-09-08 DIAGNOSIS — I700 Atherosclerosis of aorta: Secondary | ICD-10-CM | POA: Diagnosis not present

## 2021-09-08 DIAGNOSIS — J84112 Idiopathic pulmonary fibrosis: Secondary | ICD-10-CM | POA: Diagnosis not present

## 2021-09-08 DIAGNOSIS — R9389 Abnormal findings on diagnostic imaging of other specified body structures: Secondary | ICD-10-CM

## 2021-09-08 DIAGNOSIS — R918 Other nonspecific abnormal finding of lung field: Secondary | ICD-10-CM | POA: Diagnosis not present

## 2021-09-08 DIAGNOSIS — J479 Bronchiectasis, uncomplicated: Secondary | ICD-10-CM | POA: Diagnosis not present

## 2021-09-23 ENCOUNTER — Ambulatory Visit: Payer: Medicare Other

## 2021-09-23 DIAGNOSIS — M810 Age-related osteoporosis without current pathological fracture: Secondary | ICD-10-CM | POA: Diagnosis not present

## 2021-09-23 DIAGNOSIS — R300 Dysuria: Secondary | ICD-10-CM | POA: Diagnosis not present

## 2021-09-29 ENCOUNTER — Encounter: Payer: Self-pay | Admitting: Internal Medicine

## 2021-09-29 ENCOUNTER — Ambulatory Visit: Payer: Medicare Other | Attending: Internal Medicine | Admitting: Internal Medicine

## 2021-09-29 VITALS — BP 90/60 | HR 68 | Ht 63.0 in | Wt 103.0 lb

## 2021-09-29 DIAGNOSIS — E785 Hyperlipidemia, unspecified: Secondary | ICD-10-CM

## 2021-09-29 DIAGNOSIS — I10 Essential (primary) hypertension: Secondary | ICD-10-CM | POA: Diagnosis not present

## 2021-09-29 DIAGNOSIS — J470 Bronchiectasis with acute lower respiratory infection: Secondary | ICD-10-CM | POA: Diagnosis not present

## 2021-09-29 NOTE — Progress Notes (Signed)
09/29/2021 Karmen Stabs Puthoff Jan 20, 1941 [id number]  CC: Follow-up  HPI:   Nicole [name] is a 81 y.o. female patient of [name], with a PMH below who presents today for a blood pressure check.  I last saw Ms. Cwikla about 1 month ago when we added amlodipine '5mg'$  each day.  Today she has no complaints.  At her last visit she brought her home BP cuff, which was found to be accurate within 10 points.  Since the addition of amlodipine her home readings have dropped significantly, in that now they average in the 063K systolic.  Per patient there was only one reading in the 160F systolic and nothing higher.  She does not use tobacco products or drink alcohol. She drinks minimal caffeine.  She does not add salt to her cooking, although she does eat some prepared foods.  She is going to her local [employer] 3-4 times per week and getting in a good workout of stair stepper and treadmill.  Because of a torn rotator cuff she is not able to do much in the way of weights.    Mrs. Veltri returns today for follow-up. She reports she's been significantly fatigued for the past several months and this is unlike her. It appears that her mood is somewhat depressed. She continues to exercise but does not seem to be helping her. She reports having her thyroid checked fairly regularly and maybe her levothyroxine was increased recently to 50 g. Blood pressure has been well controlled. She currently is being treated for sinus infection on amoxicillin. She has had some lightheadedness and dizziness. She never started Livalo due to questions about the cholesterol medicine and liver function. She does have a history of mild abnormalities in her LFTs. This of course was the reason that I recommended that medication at our last office visit. She was also provided with samples which she has no idea where they are at.  Mrs. Casciano returns today for follow-up. I previously tried her on low-dose Livalo since she had  a history of elevated liver enzymes, but a recheck showed that this also caused her liver enzymes to rise. She was then taken off of this. She's currently asymptomatic. Blood pressure is mildly elevated today.  06/07/2015  Mrs. Eiland returns today for follow-up. She reports that recently she's been more depressed. She see her therapist but he is apparently got very busy and she's not been able to see him recently. She's not currently on any medicine for depression. She did have some chest discomfort which was attributed to reflux. It seems positional and worse if she laid on one side after eating at night. She's had some relief on omeprazole 20 mg twice a day. We have adjusted her medication including adding chlorthalidone instead of hydrochlorothiazide. This is helped blood pressure and we've discontinued her beta blocker. Heart rate is, per the 40s to the 60s. She reports no change in symptoms.  07/14/2016  Mrs. Gintz was seen today follow-up. She recently underwent shoulder surgery and required additional surgery and she continues to have slow healing wearing a brace. She's had good control of her hypertension and has been losing some weight. She relates this to her surgery as well as ongoing problems with depression. She seems to be struggling with some relationship problems and is in alcoholic's anonymous. She denies any current alcohol use. She also denies any chest pain or worsening shortness of breath.  05/02/2019  [name] is seen in follow-up today.  She is fatigued and notably grieving after the loss of her husband who was a patient of mine as well back in September.  In a since then she has had much difficulty sleeping at night.  She had a sleep study however the results are still pending.  Blood pressures well controlled today.  Notably her lipids were quite elevated recently.  She has been intolerant to statins due to myalgias and her total cholesterol was 254, triglycerides 127, HDL 50  and LDL 181.  She reports this is longstanding and has not been able to get her numbers much lower.  Results are very concerning for possible familial hyperlipidemia.  08/18/2019  I had the pleasure to see Ms. Whitsel back today.  She continues to grieve over the loss of her husband.  She has a counseling appointment tomorrow.  She has responded very well to [place].  Total cholesterol now is 170, triglycerides 124, HDL 54 and LDL of 94, down from 181.  09/29/2021  Ms. Jack Quarto returns today for follow-up.  She seems to be doing much better on Repatha.  Cholesterol has improved significantly with total 154, triglycerides 147, HDL 55 and LDL 74.  She does not have the side effects that she experienced with statins.  She has had some pulmonary issues.  She was seen by pulmonary and noted on CT to have fibrosis as well as probable bronchiectasis with atypical infection.  Over the past week she has had some worsening shortness of breath, fever and drainage with nonproductive cough.  She has taken Mucinex for this with minimal relief.  She was noted to be hypotensive today at 90/60.  She reported she did not take her blood pressure medicines this morning.   Current Outpatient Medications  Medication Sig Dispense Refill   amLODipine (NORVASC) 5 MG tablet TAKE 1 TABLET (5 MG TOTAL) BY MOUTH DAILY. PATIENT MUST KEEP APPOINTMENT FOR FUTURE REFILL 90 tablet 0   Calcium Carbonate-Vitamin D (CALCIUM-VITAMIN D3 PO) Take 15 mLs by mouth 2 (two) times daily.     CINNAMON PO Take 1 capsule by mouth 2 (two) times daily.     clonazePAM (KLONOPIN) 1 MG tablet Take 0.5 mg by mouth 2 (two) times daily as needed for anxiety.     denosumab (PROLIA) 60 MG/ML SOSY injection Inject 60 mg into the skin every 6 (six) months.     HYDROcodone-acetaminophen (NORCO/VICODIN) 5-325 MG tablet Take 1 tablet by mouth every 6 (six) hours as needed for up to 15 doses for severe pain. 15 tablet 0   levothyroxine (SYNTHROID) 88 MCG tablet  Take 88 mcg by mouth daily.     MILK THISTLE PO Take 1 capsule by mouth 2 (two) times daily.     Multiple Vitamin (MULTIVITAMIN WITH MINERALS) TABS tablet Take 1 tablet by mouth daily.     NONFORMULARY OR COMPOUNDED ITEM [facility]:  Peripheral Neuropathy cream - Bupivacaine 1%, Doxepin 3%, Gabapentin 6%, Pentoxifylline 3%, Topiramate 1%, apply 1-2 grams to affected area 3-4 times daily. 100 each 5   olmesartan (BENICAR) 40 MG tablet olmesartan 40 mg tablet  TAKE 1 TABLET BY MOUTH EVERY DAY     omeprazole (PRILOSEC) 20 MG capsule Take 20 mg by mouth as needed.      pregabalin (LYRICA) 50 MG capsule Take 1 capsule (50 mg total) by mouth 3 (three) times daily. 90 capsule 5   REPATHA SURECLICK 109 MG/ML SOAJ INJECT 1 DOSE INTO THE SKIN EVERY 14 DAYS 6 mL 3  Suvorexant (BELSOMRA) 20 MG TABS 1 for sleep 30 tablet 5   No current facility-administered medications for this visit.    Allergies  Allergen Reactions   Keppra [Levetiracetam]     Depression   Rosuvastatin Calcium Diarrhea    Muscle aches   Statins Other (See Comments)    Muscle aches   Lamictal [Lamotrigine] Rash    Past Medical History:  Diagnosis Date   Anxiety    Arthritis    Cancer ([place])    Basal cell   GERD (gastroesophageal reflux disease)    HTN (hypertension)    Hyperlipemia    Hypothyroidism    Increased liver enzymes    PONV (postoperative nausea and vomiting)     Blood pressure 90/60, pulse 68, height '5\' 3"'$  (1.6 m), weight 103 lb (46.7 kg), SpO2 96 %.   EXAM: Deferred  EKG: Deferred  IMPRESSION: Hypertension-uncontrolled Atypical chest pain Possible familial dyslipidemia with statin intolerance due to nonalcoholic fatty liver disease, elevated LFTs and myalgias GERD Pulmonary fibrosis/bronchiectasis  PLAN:   1.  Mrs. Welling has had improvement in her lipids on Repatha now close to target.  We will continue with this therapy.  She has been noted to have pulmonary fibrosis/bronchiectasis  on CT with probable chronic atypical infection.  Over the past week she has had some worsening shortness of breath and nonproductive cough as well as fever and blood pressure is low today.  She did not take her medicines.  I advised her to monitor blood pressure, hydrate well and consider backing off on the dose of her medicines tomorrow if it remains low.  Her pulmonary exam demonstrated right basilar crackles and decreased breath sounds but no clear consolidation.  I discussed a possible chest x-ray with her but she would like to monitor this further and I advised her to contact her pulmonologist if her symptoms worsen or are not improved next week.  Chest x-ray may not be sensitive enough to detect a pneumonia and given her underlying bronchiectasis and chronic atypical infection, it may be hard to discriminate that.  Follow-up with me in 6 months or sooner as necessary.  Pixie Casino, MD, [facility], [place] Director of the [facility] Lipid [facility] &  Cardi[facility] Diplomate of the American Board of Clinical Lipidology Attending Cardiologist  Direct Dial: [phone]  Fax: [phone]  Website:  [email or web address]

## 2021-09-29 NOTE — Patient Instructions (Signed)
Medication Instructions:  NO CHANGES  *If you need a refill on your cardiac medications before your next appointment, please call your pharmacy*   Lab Work: FASTING lab work in 6 months to check cholesterol   If you have labs (blood work) drawn today and your tests are completely normal, you will receive your results only by: [place] (if you have MyChart) OR A paper copy in the mail If you have any lab test that is abnormal or we need to change your treatment, we will call you to review the results.   Testing/Procedures: NONE   Follow-Up: At [facility], you and your health needs are our priority.  As part of our continuing mission to provide you with exceptional heart care, we have created designated Provider Care Teams.  These Care Teams include your primary Cardiologist (physician) and Advanced Practice Providers (APPs -  Physician Assistants and Nurse Practitioners) who all work together to provide you with the care you need, when you need it.  We recommend signing up for the patient portal called "MyChart".  Sign up information is provided on this After Visit Summary.  MyChart is used to connect with patients for Virtual Visits (Telemedicine).  Patients are able to view lab/test results, encounter notes, upcoming appointments, etc.  Non-urgent messages can be sent to your provider as well.   To learn more about what you can do with MyChart, go to [email or web address].    Your next appointment:   6 month(s)  The format for your next appointment:   In Person  Provider:   Pixie Casino, MD    Other Instructions Hydrate well Check BP at home If you have not improved next week, follow up with pulmonologist

## 2021-09-30 DIAGNOSIS — H04123 Dry eye syndrome of bilateral lacrimal glands: Secondary | ICD-10-CM | POA: Diagnosis not present

## 2021-10-11 DIAGNOSIS — Z96612 Presence of left artificial shoulder joint: Secondary | ICD-10-CM | POA: Diagnosis not present

## 2021-10-11 DIAGNOSIS — M542 Cervicalgia: Secondary | ICD-10-CM | POA: Diagnosis not present

## 2021-10-15 ENCOUNTER — Other Ambulatory Visit: Payer: Self-pay | Admitting: Internal Medicine

## 2021-10-22 NOTE — Progress Notes (Signed)
HPI F never smoker,followed for lung Nodules, Bronchiectasis, ILD/UIP  complicated by  HTN, GERD, Hepatic Steatosis, Hypothyroid, PONV, Arthritis, Hyperlipidemia, CAD, Aortic Atherosclerosis,   (widow of my patient Nicole [name] who died with complications of thymic cancer, esophageal cancer, aspiration pneumonia, chronic lung disease.) Lab- [place] TB Gold 11/27/19- Negative HRCT chest 01/07/20-  considered probable usual interstitial pneumonia (UIP) mucoid impaction in the lingula accounting for the micro and macronodularity in this region. 2. Aortic atherosclerosis, in addition to two vessel coronary artery disease. HST 04/24/19- AHI 1.2/ hr PFT 04/12/20- minimal obstruction(possible), no response to BD, Nl lung volumes, minimal DLCO deficit UIP w/u labs 3/29- negative. ========================================================= 01/07/21- 46 yoF never smoker,followed for lung Nodules, Bronchiectasis, ILD/UIP  complicated by  HTN, GERD, Hepatic Steatosis, Hypothyroid, PONV, Arthritis, Hyperlipidemia, CAD, Aortic Atherosclerosis,   (widow of my patient Nicole [name] who died with complications of thymic cancer, esophageal cancer, aspiration pneumonia, chronic lung disease.) -clonazepam 1 mg, doxepin 50, Norco, temazepam 15 Covid vax- 3 Phizer Flu vax- had -----Patient would like to go over CT results, no concerns. She feels stable, denying any cough or phlegm.  We reviewed her chest CT, pointing out nodules and areas of inflammation/fibrosis.  She agrees to follow-up CT in 6 months.  If we see progression of nodules consistent with infection and there is still no sputum, I told her we might be considering bronchoscopy. HRCT 01/02/21- IMPRESSION: 1. Focal bronchiectasis of the lingula with increased clustered nodules, likely due to worsening chronic atypical infection. 2. Largest new nodule measures 7 mm in mean diameter and is located in the left upper lobe. Non-contrast chest+ CT at 6-12 months  is recommended. If the nodule is stable at time of repeat CT, then future CT at 18-24 months (from today's scan) is considered optional for low-risk patients, but is recommended for high-risk patients. This recommendation follows the consensus statement: Guidelines for Management of Incidental Pulmonary Nodules Detected on CT Images: From the Fleischner Society 2017; Radiology 2017; 284:228-243. 3. Mild lower lung predominant reticular opacities with associated bronchiolectasis, no evidence of progression when compared with prior exam. Findings are categorized as probable UIP per consensus guidelines: Diagnosis of Idiopathic Pulmonary Fibrosis: An Official ATS/ERS/JRS/ALAT Clinical Practice Guideline. [place], Iss 5, [phone], Sep 23 2016. 4. Mild bilateral air trapping, findings can be seen in the setting of small airways disease. 5. Aortic Atherosclerosis (ICD10-I70.0).  10/24/21- 55 yoF never smoker,followed for lung Nodules, Bronchiectasis, ILD/UIP  complicated by  HTN, GERD, Hepatic Steatosis, Hypothyroid, PONV, Arthritis, Hyperlipidemia, CAD, Aortic Atherosclerosis,   (widow of my patient Nicole [name] who died with complications of thymic cancer, esophageal cancer, aspiration pneumonia, chronic lung disease.) -clonazepam 1 mg, doxepin 50, Norco, temazepam 15 Covid vax- 3 Phizer Flu vax-  -----Doing well! Here for results. We reviewed her CT. ILD pattern fits UIP, but not progressing. She would like to avoid intervention. The nodules and bronchiectasis look like atypical/ M.avium, etc. She denies night sweats or fever and has no cough or phlegm. Discussed bronchoscopy but satisfied to continue observing. HRCT chest 09/09/21- IMPRESSION: 1. Bronchiectasis of the lingula with multiple new solid pulmonary nodules, other previously seen nodules are similar in size or resolved. Overall worsening when compared with prior exam with largest new nodule measuring 12 mm  in mean diameter. Findings are likely due to chronic atypical infection. 2. Basilar and subpleural predominant interstitial lung disease with no evidence of progression when compared with prior exam. Findings are categorized as  probable UIP per consensus guidelines: Diagnosis of Idiopathic Pulmonary Fibrosis: An Official ATS/ERS/JRS/ALAT Clinical Practice Guideline. [place], Iss 5, [phone], Sep 23 2016. 3. Aortic Atherosclerosis (ICD10-I70.0).  ROS-see HPI   + = positive Constitutional:    weight loss, night sweats, fevers, chills, fatigue, lassitude. HEENT:    headaches, difficulty swallowing, tooth/dental problems, sore throat,       sneezing, itching, ear ache, nasal congestion, post nasal drip, snoring CV:    chest pain, orthopnea, PND, swelling in lower extremities, anasarca,                                   dizziness, palpitations Resp:   shortness of breath with exertion or at rest.                productive cough,   non-productive cough, coughing up of blood.              change in color of mucus.  wheezing.   Skin:    rash or lesions. GI:  No-   heartburn, indigestion, abdominal pain, nausea, vomiting, diarrhea,                 change in bowel habits, loss of appetite GU: dysuria, change in color of urine, no urgency or frequency.   flank pain. MS:   joint pain, stiffness, decreased range of motion, back pain. Neuro-    Peripheral neuropathy Psych:  change in mood or affect.  depression or anxiety.   memory loss.  OBJ- Physical Exam General- Alert, Oriented, Affect-appropriate, Distress- none acute, + slender Skin- rash-none, lesions- none, excoriation- none Lymphadenopathy- none Head- atraumatic            Eyes- Gross vision intact, PERRLA, conjunctivae and secretions clear            Ears- Hearing, canals-normal            Nose- Clear, no-Septal dev, mucus, polyps, erosion, perforation             Throat- Mallampati II , mucosa clear ,  drainage- none, tonsils- atrophic Neck- flexible , trachea midline, no stridor , thyroid nl, carotid no bruit Chest - symmetrical excursion , unlabored           Heart/CV- RRR , no murmur , no gallop  , no rub, nl s1 s2                           - JVD- none , edema- none, stasis changes- none, varices- none           Lung- clear to P&A-no crackles, wheeze- none, cough- none , dullness-none, rub- none           Chest wall-  Abd-  Br/ Gen/ Rectal- Not done, not indicated Extrem- cyanosis- none, clubbing, none, atrophy- none, strength- nl Neuro- grossly intact to observation

## 2021-10-24 ENCOUNTER — Ambulatory Visit: Payer: Medicare Other | Admitting: Internal Medicine

## 2021-10-24 ENCOUNTER — Encounter: Payer: Self-pay | Admitting: Internal Medicine

## 2021-10-24 VITALS — BP 130/78 | HR 48 | Temp 98.2°F | Ht 63.0 in | Wt 101.4 lb

## 2021-10-24 DIAGNOSIS — J479 Bronchiectasis, uncomplicated: Secondary | ICD-10-CM

## 2021-10-24 DIAGNOSIS — J849 Interstitial pulmonary disease, unspecified: Secondary | ICD-10-CM

## 2021-10-24 DIAGNOSIS — J84112 Idiopathic pulmonary fibrosis: Secondary | ICD-10-CM

## 2021-10-24 NOTE — Patient Instructions (Addendum)
Order- HRCT in 6 months  ILD protocol  Please call if we can help

## 2021-10-25 ENCOUNTER — Other Ambulatory Visit: Payer: Self-pay | Admitting: Family Medicine

## 2021-10-25 ENCOUNTER — Encounter: Payer: Self-pay | Admitting: Internal Medicine

## 2021-10-25 DIAGNOSIS — Z1231 Encounter for screening mammogram for malignant neoplasm of breast: Secondary | ICD-10-CM

## 2021-10-25 DIAGNOSIS — J479 Bronchiectasis, uncomplicated: Secondary | ICD-10-CM | POA: Insufficient documentation

## 2021-10-25 NOTE — Assessment & Plan Note (Signed)
Radiology pattern UIP but not progressive at age 81 Plan- f/u CT in 6 months

## 2021-10-25 NOTE — Assessment & Plan Note (Signed)
Lingular bronchiectasis with nodules, progressive on HRCT 09/07/21. No cough, phlegm, fever or adenopathy. Plan- repeat CT in 6 months. Discussed possibility of bronch/ lavage for culture. She is happy for now to avoid invasive procedures.

## 2021-11-01 ENCOUNTER — Ambulatory Visit (HOSPITAL_COMMUNITY): Payer: Medicare Other

## 2021-11-04 ENCOUNTER — Ambulatory Visit: Payer: Medicare Other | Admitting: Obstetrics and Gynecology

## 2021-11-08 ENCOUNTER — Telehealth: Payer: Self-pay | Admitting: Internal Medicine

## 2021-11-09 NOTE — Telephone Encounter (Signed)
Called and left voicemail for patient to call office back in regards to her questions about her being safe for anesthesia.

## 2021-11-14 ENCOUNTER — Encounter: Payer: Self-pay | Admitting: *Deleted

## 2021-11-21 DIAGNOSIS — M79644 Pain in right finger(s): Secondary | ICD-10-CM | POA: Diagnosis not present

## 2021-11-21 DIAGNOSIS — R2231 Localized swelling, mass and lump, right upper limb: Secondary | ICD-10-CM | POA: Insufficient documentation

## 2021-12-01 DIAGNOSIS — M79644 Pain in right finger(s): Secondary | ICD-10-CM | POA: Insufficient documentation

## 2021-12-02 DIAGNOSIS — R2231 Localized swelling, mass and lump, right upper limb: Secondary | ICD-10-CM | POA: Diagnosis not present

## 2021-12-02 DIAGNOSIS — M79644 Pain in right finger(s): Secondary | ICD-10-CM | POA: Diagnosis not present

## 2021-12-09 ENCOUNTER — Telehealth: Payer: Self-pay | Admitting: Internal Medicine

## 2021-12-12 NOTE — Telephone Encounter (Signed)
Wanted to get clarification before calling patient back.   Can patient get the RSV vaccine and the covid booster? Is it safe to get them at the same time or should they be given a two different times sir?   Please advise

## 2021-12-12 NOTE — Telephone Encounter (Signed)
Called and spoke to patient and gave her the recommendations from Dr Annamaria Boots. She verbalized understanding. Nothing further needed

## 2021-12-12 NOTE — Telephone Encounter (Signed)
It is medically safe to get both RSV and Covid vaccines at same time and some people do. Most people prefer to separate them by a week or so.

## 2021-12-13 DIAGNOSIS — D225 Melanocytic nevi of trunk: Secondary | ICD-10-CM | POA: Diagnosis not present

## 2021-12-13 DIAGNOSIS — L814 Other melanin hyperpigmentation: Secondary | ICD-10-CM | POA: Diagnosis not present

## 2021-12-13 DIAGNOSIS — L218 Other seborrheic dermatitis: Secondary | ICD-10-CM | POA: Diagnosis not present

## 2021-12-13 DIAGNOSIS — L820 Inflamed seborrheic keratosis: Secondary | ICD-10-CM | POA: Diagnosis not present

## 2021-12-13 DIAGNOSIS — L821 Other seborrheic keratosis: Secondary | ICD-10-CM | POA: Diagnosis not present

## 2021-12-26 ENCOUNTER — Ambulatory Visit: Payer: Medicare Other | Admitting: Neurology

## 2021-12-26 ENCOUNTER — Encounter: Payer: Self-pay | Admitting: Neurology

## 2021-12-27 ENCOUNTER — Ambulatory Visit: Payer: Medicare Other

## 2022-01-10 ENCOUNTER — Encounter: Payer: Self-pay | Admitting: Obstetrics and Gynecology

## 2022-01-10 ENCOUNTER — Telehealth: Payer: Self-pay | Admitting: Internal Medicine

## 2022-01-10 ENCOUNTER — Ambulatory Visit (INDEPENDENT_AMBULATORY_CARE_PROVIDER_SITE_OTHER): Payer: Medicare Other | Admitting: Obstetrics and Gynecology

## 2022-01-10 ENCOUNTER — Other Ambulatory Visit (HOSPITAL_COMMUNITY)
Admission: RE | Admit: 2022-01-10 | Discharge: 2022-01-10 | Disposition: A | Discharge status: Home / Self Care | Payer: Medicare Other | Attending: Obstetrics and Gynecology | Admitting: Obstetrics and Gynecology

## 2022-01-10 VITALS — BP 128/80 | HR 59 | Ht 61.25 in | Wt 102.0 lb

## 2022-01-10 DIAGNOSIS — N812 Incomplete uterovaginal prolapse: Secondary | ICD-10-CM | POA: Diagnosis not present

## 2022-01-10 DIAGNOSIS — N8110 Cystocele, unspecified: Secondary | ICD-10-CM | POA: Diagnosis not present

## 2022-01-10 DIAGNOSIS — R350 Frequency of micturition: Secondary | ICD-10-CM

## 2022-01-10 DIAGNOSIS — R82998 Other abnormal findings in urine: Secondary | ICD-10-CM | POA: Diagnosis not present

## 2022-01-10 DIAGNOSIS — N816 Rectocele: Secondary | ICD-10-CM

## 2022-01-10 DIAGNOSIS — N3941 Urge incontinence: Secondary | ICD-10-CM

## 2022-01-10 LAB — POCT URINALYSIS DIPSTICK
Bilirubin, UA: NEGATIVE
Glucose, UA: NEGATIVE
Ketones, UA: NEGATIVE
Nitrite, UA: NEGATIVE
Protein, UA: NEGATIVE
Spec Grav, UA: 1.015 (ref 1.010–1.025)
Urobilinogen, UA: 0.2 E.U./dL
pH, UA: 7 (ref 5.0–8.0)

## 2022-01-10 LAB — URINALYSIS, ROUTINE W REFLEX MICROSCOPIC
Bacteria, UA: NONE SEEN
Bilirubin Urine: NEGATIVE
Glucose, UA: NEGATIVE mg/dL
Ketones, ur: NEGATIVE mg/dL
Nitrite: NEGATIVE
Protein, ur: NEGATIVE mg/dL
Specific Gravity, Urine: 1.009 (ref 1.005–1.030)
pH: 6 (ref 5.0–8.0)

## 2022-01-10 MED ORDER — AMLODIPINE BESYLATE 5 MG PO TABS: 5.0000 mg | ORAL_TABLET | Freq: Every day | ORAL | 0 refills | Status: DC

## 2022-01-10 NOTE — Patient Instructions (Addendum)
Constipation: Our goal is to achieve formed bowel movements daily or every-other-day.  You may need to try different combinations of the following options to find what works best for you - everybody's body works differently so feel free to adjust the dosages as needed.  Some options to help maintain bowel health include:  Dietary changes (more leafy greens, vegetables and fruits; less processed foods) Fiber supplementation (Benefiber, FiberCon, Metamucil or Psyllium). Start slow and increase gradually to full dose. Over-the-counter agents such as: stool softeners (Docusate or Colace) and/or laxatives (Miralax, milk of magnesia)  "Power Pudding" is a natural mixture that may help your constipation.  To make blend 1 cup applesauce, 1 cup wheat bran, and 3/4 cup prune juice, refrigerate and then take 1 tablespoon daily with a large glass of water as needed.  You have a stage 2-3 (out of 4) prolapse.  We discussed the fact that it is not life threatening but there are several treatment options. For treatment of pelvic organ prolapse, we discussed options for management including expectant management, conservative management, and surgical management, such as Kegels, a pessary, pelvic floor physical therapy, and specific surgical procedures.

## 2022-01-10 NOTE — Telephone Encounter (Signed)
PA for repatha submitted via CMM (Key: [id number])
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
## 2022-01-10 NOTE — Telephone Encounter (Signed)
*  STAT* If patient is at the pharmacy, call can be transferred to refill team.   1. Which medications need to be refilled? (please list name of each medication and dose if known) amLODipine (NORVASC) 5 MG tablet   2. Which pharmacy/location (including street and city if local pharmacy) is medication to be sent to? [facility] ([place], [place] [address] [place]   3. Do they need a 30 day or 90 day supply? [place]

## 2022-01-10 NOTE — Telephone Encounter (Signed)
Request Reference Number: [id number]. REPATHA SURE INJ '140MG'$ /ML is approved through 01/23/2023. Your patient may now fill this prescription and it will be covered.

## 2022-01-11 LAB — URINE CULTURE: Culture: <10000 — AB

## 2022-01-13 ENCOUNTER — Other Ambulatory Visit: Payer: Self-pay | Admitting: Internal Medicine

## 2022-01-13 DIAGNOSIS — Z130 Encounter for screening for diseases of the blood and blood-forming organs and certain disorders involving the immune mechanism: Secondary | ICD-10-CM | POA: Diagnosis not present

## 2022-01-13 DIAGNOSIS — E7800 Pure hypercholesterolemia, unspecified: Secondary | ICD-10-CM | POA: Diagnosis not present

## 2022-01-13 DIAGNOSIS — D692 Other nonthrombocytopenic purpura: Secondary | ICD-10-CM | POA: Diagnosis not present

## 2022-01-13 DIAGNOSIS — E039 Hypothyroidism, unspecified: Secondary | ICD-10-CM | POA: Diagnosis not present

## 2022-01-13 DIAGNOSIS — M810 Age-related osteoporosis without current pathological fracture: Secondary | ICD-10-CM | POA: Diagnosis not present

## 2022-01-13 DIAGNOSIS — Z0000 Encounter for general adult medical examination without abnormal findings: Secondary | ICD-10-CM | POA: Diagnosis not present

## 2022-01-13 DIAGNOSIS — I129 Hypertensive chronic kidney disease with stage 1 through stage 4 chronic kidney disease, or unspecified chronic kidney disease: Secondary | ICD-10-CM | POA: Diagnosis not present

## 2022-01-13 DIAGNOSIS — N181 Chronic kidney disease, stage 1: Secondary | ICD-10-CM | POA: Diagnosis not present

## 2022-01-13 DIAGNOSIS — J8410 Pulmonary fibrosis, unspecified: Secondary | ICD-10-CM | POA: Diagnosis not present

## 2022-01-13 DIAGNOSIS — K219 Gastro-esophageal reflux disease without esophagitis: Secondary | ICD-10-CM | POA: Diagnosis not present

## 2022-01-23 ENCOUNTER — Other Ambulatory Visit: Payer: Self-pay | Admitting: Neurology

## 2022-01-24 DIAGNOSIS — L821 Other seborrheic keratosis: Secondary | ICD-10-CM | POA: Diagnosis not present

## 2022-01-24 DIAGNOSIS — D492 Neoplasm of unspecified behavior of bone, soft tissue, and skin: Secondary | ICD-10-CM | POA: Diagnosis not present

## 2022-01-25 NOTE — Telephone Encounter (Signed)
Verify Drug Registry For Pregabalin 50 Mg Capsule Last Filled: 10/21/2021 Quantity: 90 capsules for 30 days Last appointment: 06/23/2021 Next appointment: 03/14/2022

## 2022-01-30 ENCOUNTER — Ambulatory Visit: Payer: Medicare Other | Admitting: Obstetrics and Gynecology

## 2022-02-22 ENCOUNTER — Ambulatory Visit: Payer: Medicare Other

## 2022-02-28 ENCOUNTER — Ambulatory Visit
Admission: RE | Admit: 2022-02-28 | Discharge: 2022-02-28 | Disposition: A | Discharge status: Home / Self Care | Payer: Medicare Other | Source: Ambulatory Visit | Attending: Family Medicine | Admitting: Family Medicine

## 2022-02-28 DIAGNOSIS — Z1231 Encounter for screening mammogram for malignant neoplasm of breast: Secondary | ICD-10-CM

## 2022-03-03 ENCOUNTER — Ambulatory Visit: Payer: Medicare Other

## 2022-03-03 DIAGNOSIS — R82998 Other abnormal findings in urine: Secondary | ICD-10-CM

## 2022-03-03 DIAGNOSIS — R319 Hematuria, unspecified: Secondary | ICD-10-CM | POA: Diagnosis not present

## 2022-03-03 DIAGNOSIS — R350 Frequency of micturition: Secondary | ICD-10-CM

## 2022-03-03 LAB — POCT URINALYSIS DIPSTICK
Bilirubin, UA: NEGATIVE
Glucose, UA: NEGATIVE
Ketones, UA: NEGATIVE
Nitrite, UA: NEGATIVE
Protein, UA: POSITIVE — AB
Spec Grav, UA: 1.025 (ref 1.010–1.025)
Urobilinogen, UA: 0.2 E.U./dL
pH, UA: 6.5 (ref 5.0–8.0)

## 2022-03-03 MED ORDER — SULFAMETHOXAZOLE-TRIMETHOPRIM 800-160 MG PO TABS: 1.0000 | ORAL_TABLET | Freq: Two times a day (BID) | ORAL | 0 refills | Status: DC

## 2022-03-03 NOTE — Patient Instructions (Signed)
Your Urine dip that was done in office was POSITIVE. I am sending the urine off for culture and you can take AZO over the counter for your discomfort.  We have also ordered Bactrim for you to take while we wait for your culture results, hopefully this gives you some relief. We will contact you when the results are back between 3-5 days. If a different antibiotic is needed we will sent the order to the pharmacy and you will be notified. If you have any questions or concerns please feel free to call us at [phone]

## 2022-03-03 NOTE — Progress Notes (Signed)
Nicole [name] is a 82 y.o. female arrived today with UTI sx.  Per Dr. Tommas Olp protocol: A urine specimen was collected and POCT Urine was done and urine culture sent to the lab. POCT Urine was POSITIVE for leukocytes and moderate blood  Pt was notified and prescription sent to the preferred pharmacy.

## 2022-03-04 LAB — URINALYSIS, MICROSCOPIC ONLY
Casts: NONE SEEN /lpf
Epithelial Cells (non renal): NONE SEEN /hpf (ref 0–10)
WBC, UA: >30 /hpf — AB (ref 0–5)

## 2022-03-08 ENCOUNTER — Other Ambulatory Visit: Payer: Self-pay

## 2022-03-08 DIAGNOSIS — R82998 Other abnormal findings in urine: Secondary | ICD-10-CM

## 2022-03-08 LAB — URINE CULTURE

## 2022-03-08 MED ORDER — SULFAMETHOXAZOLE-TRIMETHOPRIM 800-160 MG PO TABS: 1.0000 | ORAL_TABLET | Freq: Two times a day (BID) | ORAL | 0 refills | Status: DC

## 2022-03-08 NOTE — Progress Notes (Signed)
Refill sent per Dr Wannetta Sender. Pt continuing to have UTI sx. Culture still pending

## 2022-03-09 NOTE — Progress Notes (Signed)
Attempted to contact patient. Nicole [name]

## 2022-03-10 NOTE — Progress Notes (Signed)
Pt was notified.  

## 2022-03-13 ENCOUNTER — Encounter: Payer: Self-pay | Admitting: *Deleted

## 2022-03-14 ENCOUNTER — Other Ambulatory Visit: Payer: Self-pay

## 2022-03-14 ENCOUNTER — Telehealth: Payer: Self-pay | Admitting: Neurology

## 2022-03-14 ENCOUNTER — Encounter: Payer: Self-pay | Admitting: Neurology

## 2022-03-14 ENCOUNTER — Ambulatory Visit: Payer: Medicare Other | Admitting: Neurology

## 2022-03-14 VITALS — BP 122/57 | HR 58 | Ht 62.6 in | Wt 104.5 lb

## 2022-03-14 DIAGNOSIS — M79671 Pain in right foot: Secondary | ICD-10-CM | POA: Insufficient documentation

## 2022-03-14 DIAGNOSIS — G2581 Restless legs syndrome: Secondary | ICD-10-CM

## 2022-03-14 MED ORDER — LIDOCAINE-PRILOCAINE 2.5-2.5 % EX CREA: TOPICAL_CREAM | CUTANEOUS | 11 refills | Status: DC

## 2022-03-14 MED ORDER — PREGABALIN 50 MG PO CAPS: 150.0000 mg | ORAL_CAPSULE | Freq: Every day | ORAL | 5 refills | Status: DC

## 2022-03-14 MED ORDER — DICLOFENAC SODIUM 1 % EX CREA: TOPICAL_CREAM | CUTANEOUS | 11 refills | Status: DC

## 2022-03-14 NOTE — Progress Notes (Signed)
Chief Complaint  Patient presents with   Follow-up    Rm 14. Alone. Continues to c/o right foot paresthesia.      ASSESSMENT AND PLAN  Nicole [name] is a 82 y.o. female   Restless leg symptoms Right foot pain  In the setting of worsening right bunion, thick callus  Likely combination of musculoskeletal etiology,  Previously tried gabapentin, Cymbalta, is a limited help,  Move Lyrica to 50 mg 1 to 3 tablets every night only,  Diclofenac gel mix with Emla gel, warm compression, massage for right foot pain, also suggested her podiatrist evaluation    DIAGNOSTIC DATA (LABS, IMAGING, TESTING) - I reviewed patient records, labs, notes, testing and imaging myself where available.   MEDICAL HISTORY:  Nicole [name] is a 82 year old female, previously patient of Dr. Jannifer Franklin, return to evaluation for bilateral lower extremity paresthesia, her primary care physician is Dr. Laurann Montana, Margaretha Sheffield,    I reviewed and summarized the referring note.  Past medical history Prediabetes, was under good control with weight loss and tired,  She was seen by Dr. Jannifer Franklin since 2019 for evaluation of few years history of gradual onset numbness in her feet, she describes discomfort when bearing weight, but most bothersome symptoms is at night when she tried to go to sleep, she has difficulty finding a comfortable position, bilateral lower extremity numbness tingling deep achy sensation, she was given gabapentin up to 300 mg twice a day without much benefit  EMG nerve conduction study by Dr. 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, requires standing on her feet for prolonged period of time, she described bilateral foot  burning sensation after prolonged standing, she denies difficulty walking, walk around the park regularly about 1 mile without any difficulty, she denies significant low back pain, no bilateral lower extremity paresthesia  She does have bladder prolapse, complains of bladder frequency, urgency,  Nighttime she complains of difficulty falling to sleep because bilateral lower extremity achy numbing sensation, but did not have the urge to pace around, she has tried Capzasin and lidocaine cream with limited help   Evaluation in March 2022 showed normal negative ANA, ESR, and disco derma antibody, Sjogren antibody, ANCA, rheumatoid factor, Vit D.  UPDATE Mar 14 2022: She was given Lyrica since June 2023, initially it was helping, now less obvious, she is taking 50 mg 1-2 times a day, 1 in the morning, the other at nighttime  She works part-time job at [employer], aft standing for few hours, she felt right foot pain, centered around her right first and second toes, ball area, at nighttime, she has the urge to move her leg, difficulty falling to sleep,  She complains of mild low back pain, denies bowel or bladder incontinence  She had a history of left bunion surgery, right bunion gradually getting worse, also has a thick callus at the base of the right second toe,  PHYSICAL EXAM:   Vitals:   03/14/22 0949  BP: (!) 122/57  Pulse: (!) 58  Weight: 104 lb 8 oz (47.4 kg)  Height: 5' 2.6" (1.59 m)    Body mass index is 18.75 kg/m.  PHYSICAL EXAMNIATION:  Gen: NAD, conversant, well nourised, well groomed                     Cardiovascular: Regular rate rhythm, no peripheral edema, warm, nontender. Eyes: Conjunctivae clear without exudates or hemorrhage Neck: Supple, no carotid  bruits. Pulmonary: Clear to auscultation bilaterally   NEUROLOGICAL EXAM:  MENTAL STATUS: Speech/cognition: Awake, alert, oriented to history taking and casual conversation CRANIAL NERVES: CN II: Visual fields are full to confrontation. Pupils are round equal and briskly reactive to light. CN III, IV, VI: extraocular movement are normal. No ptosis. CN V: Facial sensation is intact to light touch CN VII: Face is symmetric with normal eye closure  CN VIII: Hearing is normal to causal conversation. CN IX, X: Phonation is normal. CN XI: Head turning and shoulder shrug are intact  MOTOR: There is no pronator drift of out-stretched arms. Muscle bulk and tone are normal. Muscle strength is normal.  Right bunion, thick callus at the base of right second toe, tenderness of right first, second metatarsal joints upon deep palpitation  REFLEXES: Reflexes are 1 and symmetric at the biceps, triceps, knees, and ankles. Plantar responses are flexor.  SENSORY: Decreased vibratory sensation at toes, preserved to pinprick and light touch.  COORDINATION: There is no trunk or limb dysmetria noted.  GAIT/STANCE: He needs push-up to get up from seated position, standing  REVIEW OF SYSTEMS:  Full 14 system review of systems performed and notable only for as above All other review of systems were negative.   ALLERGIES: Allergies  Allergen Reactions   Keppra [Levetiracetam]     Depression   Other     She is allergic to an antibiotic for uti that begins with "M"   Rosuvastatin Calcium Diarrhea    Muscle aches   Statins Other (See Comments)    Muscle aches   Lamictal [Lamotrigine] Rash    HOME MEDICATIONS: Current Outpatient Medications  Medication Sig Dispense Refill   amLODipine (NORVASC) 5 MG tablet Take 1 tablet (5 mg total) by mouth daily. 90 tablet 0   Calcium Carbonate-Vitamin D (CALCIUM-VITAMIN D3 PO) Take 15 mLs by mouth 2 (two) times daily.     CINNAMON PO Take 1 capsule by mouth  2 (two) times daily.     clonazePAM (KLONOPIN) 1 MG tablet Take 0.5 mg by mouth 2 (two) times daily as needed for anxiety.     denosumab (PROLIA) 60 MG/ML SOSY injection Inject 60 mg into the skin every 6 (six) months.     HYDROcodone-acetaminophen (NORCO/VICODIN) 5-325 MG tablet Take 1 tablet by mouth every 6 (six) hours as needed for up to 15 doses for severe pain. 15 tablet 0   levothyroxine (SYNTHROID) 88 MCG tablet Take 88 mcg by mouth daily.     MILK THISTLE PO Take 1 capsule by mouth 2 (two) times daily.     Multiple Vitamin (MULTIVITAMIN WITH MINERALS) TABS tablet Take 1 tablet by mouth daily.     olmesartan (BENICAR) 40 MG tablet olmesartan 40 mg tablet  TAKE 1 TABLET BY MOUTH EVERY DAY     pregabalin (LYRICA) 50 MG capsule TAKE 1 CAPSULE BY MOUTH 3 TIMES DAILY. (Patient taking differently: Take 50 mg by mouth 3 (three) times daily.) 90 capsule 0   REPATHA SURECLICK [id number] MG/ML SOAJ INJECT 1 DOSE INTO THE SKIN EVERY 14 DAYS 6 mL 3   NONFORMULARY  OR COMPOUNDED ITEM [facility]:  Peripheral Neuropathy cream - Bupivacaine 1%, Doxepin 3%, Gabapentin 6%, Pentoxifylline 3%, Topiramate 1%, apply 1-2 grams to affected area 3-4 times daily. (Patient not taking: Reported on 03/14/2022) 100 each 5   No current facility-administered medications for this visit.    PAST MEDICAL HISTORY: Past Medical History:  Diagnosis Date   Anxiety    Arthritis    Cancer ([place])    Basal cell   GERD (gastroesophageal reflux disease)    HTN (hypertension)    Hyperlipemia    Hypothyroidism    Increased liver enzymes    PONV (postoperative nausea and vomiting)     PAST SURGICAL HISTORY: Past Surgical History:  Procedure Laterality Date   BREAST BIOPSY Left 12/28/2010   BREAST EXCISIONAL BIOPSY Left 12/28/2010   BREAST SURGERY Bilateral    reduction over 20 years   CARDIOVASCULAR STRESS TEST  05/1998   breast attenuation in anterior septal wall, EF 63%   CHOLECYSTECTOMY     FOOT SURGERY     HIP  SURGERY     HYSTEROSCOPY     REDUCTION MAMMAPLASTY Bilateral    over 20 years ago   REVERSE SHOULDER ARTHROPLASTY Left 05/12/2016   Procedure: LEFT REVERSE TOTAL SHOULDER ARTHROPLASTY;  Surgeon: Netta Cedars, MD;  Location: [facility];  Service: Orthopedics;  Laterality: Left;    FAMILY HISTORY: Family History  Problem Relation Age of Onset   Depression Mother        stroke   Stroke Father    Drug abuse Daughter    Depression Daughter    Heart attack Brother 35   Breast cancer Neg Hx     SOCIAL HISTORY: Social History   Socioeconomic History   Marital status: Widowed    Spouse name: Not on file   Number of children: 2   Years of education: 5   Highest education level: Not on file  Occupational History   Occupation: Sales    Employer: [employer]  Tobacco Use   Smoking status: Never   Smokeless tobacco: Never  Vaping Use   Vaping Use: Never used  Substance and Sexual Activity   Alcohol use: Yes    Alcohol/week: 2.0 standard drinks of alcohol    Types: 2 Glasses of wine per week    Comment: occasionally   Drug use: No   Sexual activity: Not Currently  Other Topics Concern   Not on file  Social History Narrative   Lives at home with husband   Right handed    1 cup of Caffeine daily     Social Determinants of Health   Financial Resource Strain: Not on file  Food Insecurity: Not on file  Transportation Needs: Not on file  Physical Activity: Not on file  Stress: Not on file  Social Connections: Not on file  Intimate Partner Violence: Not on file      Marcial Pacas, M.D. Ph.D.  [facility] [address], [place], [place] [address] Ph: [phone] Fax: [phone]  CC:  Kelton Pillar, MD [address] [employer] [address] [place],  [place] [address]  Mckinley Jewel, MD

## 2022-03-14 NOTE — Telephone Encounter (Signed)
Pt requesting prescriptions for Diclofenac Sodium 1 % CREA and lidocaine-prilocaine (EMLA) cream resent to    [facility] [address] [place], [place] [address] [phone]

## 2022-03-27 DIAGNOSIS — M810 Age-related osteoporosis without current pathological fracture: Secondary | ICD-10-CM | POA: Diagnosis not present

## 2022-04-03 ENCOUNTER — Ambulatory Visit (HOSPITAL_COMMUNITY): Payer: Medicare Other

## 2022-04-06 ENCOUNTER — Encounter: Payer: Self-pay | Admitting: Podiatry

## 2022-04-06 ENCOUNTER — Ambulatory Visit: Payer: Medicare Other | Admitting: Podiatry

## 2022-04-06 ENCOUNTER — Ambulatory Visit (INDEPENDENT_AMBULATORY_CARE_PROVIDER_SITE_OTHER): Payer: Medicare Other

## 2022-04-06 DIAGNOSIS — G629 Polyneuropathy, unspecified: Secondary | ICD-10-CM

## 2022-04-06 DIAGNOSIS — M79671 Pain in right foot: Secondary | ICD-10-CM | POA: Diagnosis not present

## 2022-04-06 MED ORDER — NONFORMULARY OR COMPOUNDED ITEM: 3 refills | Status: DC

## 2022-04-07 ENCOUNTER — Other Ambulatory Visit: Payer: Self-pay | Admitting: Podiatry

## 2022-04-07 DIAGNOSIS — M79671 Pain in right foot: Secondary | ICD-10-CM

## 2022-04-07 DIAGNOSIS — G629 Polyneuropathy, unspecified: Secondary | ICD-10-CM

## 2022-04-07 NOTE — Progress Notes (Signed)
Subjective:   Patient ID: Nicole [name], female   DOB: 82 y.o.   MRN: [id number]   HPI Patient is concerned about neuropathic discomfort and is taking Lyrica and is wondering if we can treat that and states she knows she has diminished fat pad bilateral   ROS      Objective:  Physical Exam  Vascular status intact neurologically he is moderately diminished with patient found to have significant reduction of fat pad around the lesser MPJs bilateral with mild swelling right localized no other pathology is noted      Assessment:  Appears to be more related to the diminishment of the fat pad with patient wearing thick bottom shoes with no current indication of significant swelling     Plan:  H&P spent a great deal of time reviewing this with her and the difficulty of this condition.  I offered the consideration for injection to try to reduce inflammation and she does not want it currently and I have advised that her neurologist should continue to prescribe her Lyrica due to the fact that he is controlling the systemic element of this condition.  Patient is encouraged to wear thick bottom shoes and will be seen back if symptoms worsen and may require more aggressive treatment plan

## 2022-04-12 ENCOUNTER — Telehealth: Payer: Self-pay | Admitting: Podiatry

## 2022-04-12 NOTE — Telephone Encounter (Signed)
Pt called and the medication that was sent to [facility] and she asked for the number and I gave it to her but she then asked me to let you know that the pharmacy is saying insurance will not cover this medication.

## 2022-04-14 ENCOUNTER — Ambulatory Visit: Payer: Medicare Other | Admitting: Internal Medicine

## 2022-04-21 ENCOUNTER — Ambulatory Visit (HOSPITAL_COMMUNITY): Admission: RE | Admit: 2022-04-21 | Payer: Medicare Other | Source: Ambulatory Visit

## 2022-04-25 ENCOUNTER — Ambulatory Visit: Payer: Medicare Other | Admitting: Internal Medicine

## 2022-04-25 ENCOUNTER — Telehealth: Payer: Self-pay | Admitting: Podiatry

## 2022-04-25 NOTE — Telephone Encounter (Signed)
Pt stated that the Rx called in, is not covered by insurance & is too expensive for her to get. She wants to know if there is another Rx that can be called in that is covered by insurance. Please advise.

## 2022-04-27 ENCOUNTER — Ambulatory Visit: Payer: Medicare Other | Admitting: Internal Medicine

## 2022-04-28 ENCOUNTER — Other Ambulatory Visit: Payer: Self-pay | Admitting: Internal Medicine

## 2022-04-28 DIAGNOSIS — E785 Hyperlipidemia, unspecified: Secondary | ICD-10-CM

## 2022-05-02 ENCOUNTER — Other Ambulatory Visit: Payer: Self-pay | Admitting: Neurology

## 2022-05-02 MED ORDER — PREGABALIN 50 MG PO CAPS: 150.0000 mg | ORAL_CAPSULE | Freq: Every day | ORAL | 2 refills | Status: DC

## 2022-05-02 NOTE — Telephone Encounter (Signed)
Sometimes cream can provide limited help to patient, if it does not help, she do not have to continue that  [facility] the prescription of Lyrica was helpful, if needed, can have higher dose

## 2022-05-02 NOTE — Telephone Encounter (Signed)
   Spoke to patient, who states she will take three tabs at bedtime and let us know if she has any further concerns.

## 2022-05-02 NOTE — Telephone Encounter (Signed)
Called patient and she states that she was given a cream by ortho provider with bacitracin and other ingredients including diclofenac, which we prescribe here. She says she already reached back out to them but her insurance will not cover the cream and it is $70. She wants a second opinion from Dr. Terrace Arabia. She states that the cream she has now doesn't help.

## 2022-05-02 NOTE — Telephone Encounter (Signed)
Called and left message per dpr

## 2022-05-02 NOTE — Addendum Note (Signed)
Addended by: Deatra James on: 05/02/2022 02:43 PM   Modules accepted: Orders

## 2022-05-02 NOTE — Telephone Encounter (Signed)
Pt called stated she wants to talk to nurse about compound cream that orthopedic provider prescript

## 2022-05-04 ENCOUNTER — Telehealth: Payer: Self-pay | Admitting: Internal Medicine

## 2022-05-04 DIAGNOSIS — E785 Hyperlipidemia, unspecified: Secondary | ICD-10-CM

## 2022-05-04 MED ORDER — REPATHA SURECLICK 140 MG/ML ~~LOC~~ SOAJ: SUBCUTANEOUS | 11 refills | Status: DC

## 2022-05-04 NOTE — Telephone Encounter (Signed)
Refill sent in

## 2022-05-04 NOTE — Telephone Encounter (Signed)
PA for Repatha submitted via CMM (Key: [id number])

## 2022-05-04 NOTE — Telephone Encounter (Signed)
*  STAT* If patient is at the pharmacy, call can be transferred to refill team.   1. Which medications need to be refilled? (please list name of each medication and dose if known) Evolocumab (REPATHA SURECLICK) 140 MG/ML SOAJ   2. Which pharmacy/location (including street and city if local pharmacy) is medication to be sent to? [id number] [place] [place] - [address]   3. Do they need a 30 day or 90 day supply? 30

## 2022-05-05 NOTE — Telephone Encounter (Signed)
PA approved through 05/04/23

## 2022-05-15 ENCOUNTER — Ambulatory Visit (HOSPITAL_COMMUNITY)
Admission: RE | Admit: 2022-05-15 | Discharge: 2022-05-15 | Disposition: A | Discharge status: Home / Self Care | Payer: Medicare Other | Source: Ambulatory Visit | Attending: Internal Medicine | Admitting: Internal Medicine

## 2022-05-15 DIAGNOSIS — J849 Interstitial pulmonary disease, unspecified: Secondary | ICD-10-CM | POA: Diagnosis not present

## 2022-05-15 DIAGNOSIS — J8410 Pulmonary fibrosis, unspecified: Secondary | ICD-10-CM | POA: Diagnosis not present

## 2022-05-15 DIAGNOSIS — J479 Bronchiectasis, uncomplicated: Secondary | ICD-10-CM | POA: Diagnosis not present

## 2022-05-15 MED ORDER — SODIUM CHLORIDE (PF) 0.9 % IJ SOLN
INTRAMUSCULAR | Status: AC
  Filled 2022-05-15: qty 50

## 2022-05-17 ENCOUNTER — Other Ambulatory Visit: Payer: Self-pay

## 2022-05-17 DIAGNOSIS — R911 Solitary pulmonary nodule: Secondary | ICD-10-CM

## 2022-05-18 DIAGNOSIS — L570 Actinic keratosis: Secondary | ICD-10-CM | POA: Diagnosis not present

## 2022-05-18 DIAGNOSIS — D225 Melanocytic nevi of trunk: Secondary | ICD-10-CM | POA: Diagnosis not present

## 2022-05-18 DIAGNOSIS — L821 Other seborrheic keratosis: Secondary | ICD-10-CM | POA: Diagnosis not present

## 2022-05-18 DIAGNOSIS — L218 Other seborrheic dermatitis: Secondary | ICD-10-CM | POA: Diagnosis not present

## 2022-05-18 DIAGNOSIS — L814 Other melanin hyperpigmentation: Secondary | ICD-10-CM | POA: Diagnosis not present

## 2022-05-30 NOTE — Progress Notes (Signed)
HPI F never smoker,followed for lung Nodules, Bronchiectasis, ILD/UIP  complicated by  HTN, GERD, Hepatic Steatosis, Hypothyroid, PONV, Arthritis, Hyperlipidemia, CAD, Aortic Atherosclerosis,   (widow of my patient Nicole [name] who died with complications of thymic cancer, esophageal cancer, aspiration pneumonia, chronic lung disease.) Lab- Quatiferon TB Gold 11/27/19- Negative HRCT chest 01/07/20-  considered probable usual interstitial pneumonia (UIP) mucoid impaction in the lingula accounting for the micro and macronodularity in this region. 2. Aortic atherosclerosis, in addition to two vessel coronary artery disease. HST 04/24/19- AHI 1.2/ hr PFT 04/12/20- minimal obstruction(possible), no response to BD, Nl lung volumes, minimal DLCO deficit UIP w/u labs 3/29- negative. =========================================================   10/24/21- 80 yoF never smoker,followed for lung Nodules, Bronchiectasis, ILD/UIP  complicated by  HTN, GERD, Hepatic Steatosis, Hypothyroid, PONV, Arthritis, Hyperlipidemia, CAD, Aortic Atherosclerosis,   (widow of my patient Nicole [name] who died with complications of thymic cancer, esophageal cancer, aspiration pneumonia, chronic lung disease.) -clonazepam 1 mg, doxepin 50, Norco, temazepam 15 Covid vax- 3 Phizer Flu vax-  -----Doing well! Here for results. We reviewed her CT. ILD pattern fits UIP, but not progressing. She would like to avoid intervention. The nodules and bronchiectasis look like atypical/ M.avium, etc. She denies night sweats or fever and has no cough or phlegm. Discussed bronchoscopy but satisfied to continue observing. HRCT chest 09/09/21- IMPRESSION: 1. Bronchiectasis of the lingula with multiple new solid pulmonary nodules, other previously seen nodules are similar in size or resolved. Overall worsening when compared with prior exam with largest new nodule measuring 12 mm in mean diameter. Findings are likely due to chronic atypical  infection. 2. Basilar and subpleural predominant interstitial lung disease with no evidence of progression when compared with prior exam. Findings are categorized as probable UIP per consensus guidelines: Diagnosis of Idiopathic Pulmonary Fibrosis: An Official ATS/ERS/JRS/ALAT Clinical Practice Guideline. Am Nicole [name] Crit Care Med Vol 198, Iss 5, [phone], Sep 23 2016. 3. Aortic Atherosclerosis (ICD10-I70.0).  06/01/22- 81 yoF never smoker,followed for lung Nodules, Bronchiectasis, ILD/UIP  complicated by  HTN, GERD, Hepatic Steatosis, Hypothyroid, PONV, Arthritis, Hyperlipidemia, CAD, Aortic Atherosclerosis,   (widow of my patient Nicole [name] who died with complications of thymic cancer, esophageal cancer, aspiration pneumonia, chronic lung disease.) -clonazepam 1 mg, doxepin 50, Norco, temazepam 15 ------Pt states she is doing great She has wanted to avoid intervention for ILD. She has felt stable, reporting little cough.  Denies night sweats, adenopathy. We reviewed her April chest CT.  She has not wanted to do anything about the interstitial lung disease which we are watching.  A 7 mm nodule is noticed in the posterior left lower lobe and we will follow radiology recommendation to follow-up chest CT in 3 months. HRCT chest 05/15/22- IMPRESSION: 1. Pulmonary parenchymal pattern of fibrosis appears stable from 09/08/2021 and stable to minimally progressive from 01/07/2020. Findings are categorized as probable UIP per consensus guidelines: Diagnosis of Idiopathic Pulmonary Fibrosis: An Official ATS/ERS/JRS/ALAT Clinical Practice Guideline. Am Nicole [name] Crit Care Med Vol 198, Iss 5, [phone], Sep 23 2016. 2. New 7 mm nodular density in the posterior left lower lobe. Recommend follow-up CT chest without contrast in 3 months as malignancy cannot be excluded. 3. Bronchiectasis with waxing and waning chronic mucoid impaction in the lingula, indicative of an atypical infectious process. 4.  Cirrhosis. 5. Aortic atherosclerosis (ICD10-I70.0). Coronary artery calcification.  ROS-see HPI   + = positive Constitutional:    weight loss, night sweats, fevers, chills, fatigue, lassitude. HEENT:    headaches, difficulty swallowing,  tooth/dental problems, sore throat,       sneezing, itching, ear ache, nasal congestion, post nasal drip, snoring CV:    chest pain, orthopnea, PND, swelling in lower extremities, anasarca,                                   dizziness, palpitations Resp:   shortness of breath with exertion or at rest.                productive cough,   non-productive cough, coughing up of blood.              change in color of mucus.  wheezing.   Skin:    rash or lesions. GI:  No-   heartburn, indigestion, abdominal pain, nausea, vomiting, diarrhea,                 change in bowel habits, loss of appetite GU: dysuria, change in color of urine, no urgency or frequency.   flank pain. MS:   joint pain, stiffness, decreased range of motion, back pain. Neuro-    Peripheral neuropathy Psych:  change in mood or affect.  depression or anxiety.   memory loss.  OBJ- Physical Exam General- Alert, Oriented, Affect-appropriate, Distress- none acute, + slender Skin- rash-none, lesions- none, excoriation- none Lymphadenopathy- none Head- atraumatic            Eyes- Gross vision intact, PERRLA, conjunctivae and secretions clear            Ears- Hearing, canals-normal            Nose- Clear, no-Septal dev, mucus, polyps, erosion, perforation             Throat- Mallampati II , mucosa clear , drainage- none, tonsils- atrophic Neck- flexible , trachea midline, no stridor , thyroid nl, carotid no bruit Chest - symmetrical excursion , unlabored           Heart/CV- RRR , no murmur , no gallop  , no rub, nl s1 s2                           - JVD- none , edema- none, stasis changes- none, varices- none           Lung- clear to P&A-no crackles heard, wheeze- none, cough- none , dullness-none,  rub- none           Chest wall-  Abd-  Br/ Gen/ Rectal- Not done, not indicated Extrem- cyanosis- none, clubbing, none, atrophy- none, strength- nl Neuro- grossly intact to observation

## 2022-05-31 DIAGNOSIS — L0101 Non-bullous impetigo: Secondary | ICD-10-CM | POA: Diagnosis not present

## 2022-05-31 DIAGNOSIS — L239 Allergic contact dermatitis, unspecified cause: Secondary | ICD-10-CM | POA: Diagnosis not present

## 2022-06-01 ENCOUNTER — Ambulatory Visit: Payer: Medicare Other | Admitting: Internal Medicine

## 2022-06-01 ENCOUNTER — Encounter: Payer: Self-pay | Admitting: Internal Medicine

## 2022-06-01 VITALS — BP 138/68 | HR 59 | Ht 62.0 in | Wt 107.8 lb

## 2022-06-01 DIAGNOSIS — R911 Solitary pulmonary nodule: Secondary | ICD-10-CM | POA: Diagnosis not present

## 2022-06-01 DIAGNOSIS — J84112 Idiopathic pulmonary fibrosis: Secondary | ICD-10-CM

## 2022-06-01 DIAGNOSIS — J849 Interstitial pulmonary disease, unspecified: Secondary | ICD-10-CM | POA: Diagnosis not present

## 2022-06-01 NOTE — Patient Instructions (Signed)
Order- CT chest no contrast in 3 months   dx 7 mm left lower lobe nodule  Order- HRCT chest to be done in 9 months    dx ILD  Please call as needed

## 2022-06-07 DIAGNOSIS — J029 Acute pharyngitis, unspecified: Secondary | ICD-10-CM | POA: Diagnosis not present

## 2022-06-07 DIAGNOSIS — Z0000 Encounter for general adult medical examination without abnormal findings: Secondary | ICD-10-CM | POA: Diagnosis not present

## 2022-06-07 DIAGNOSIS — M25562 Pain in left knee: Secondary | ICD-10-CM | POA: Diagnosis not present

## 2022-06-12 DIAGNOSIS — K080 Exfoliation of teeth due to systemic causes: Secondary | ICD-10-CM | POA: Diagnosis not present

## 2022-06-21 ENCOUNTER — Telehealth: Payer: Self-pay | Admitting: Internal Medicine

## 2022-06-21 NOTE — Telephone Encounter (Signed)
Pt c/o medication issue:  1. Name of Medication:  amLODipine (NORVASC) 5 MG tablet  2. How are you currently taking this medication (dosage and times per day)?   3. Are you having a reaction (difficulty breathing--STAT)?   4. What is your medication issue?  Please confirm correct dose

## 2022-06-21 NOTE — Telephone Encounter (Signed)
Patient states "ordering" her medication and needed the dosage.  Confirmed the dose and how often to take.

## 2022-06-22 DIAGNOSIS — M810 Age-related osteoporosis without current pathological fracture: Secondary | ICD-10-CM | POA: Diagnosis not present

## 2022-06-22 DIAGNOSIS — L820 Inflamed seborrheic keratosis: Secondary | ICD-10-CM | POA: Diagnosis not present

## 2022-06-22 DIAGNOSIS — L0101 Non-bullous impetigo: Secondary | ICD-10-CM | POA: Diagnosis not present

## 2022-06-22 DIAGNOSIS — L538 Other specified erythematous conditions: Secondary | ICD-10-CM | POA: Diagnosis not present

## 2022-06-22 DIAGNOSIS — E039 Hypothyroidism, unspecified: Secondary | ICD-10-CM | POA: Diagnosis not present

## 2022-06-22 DIAGNOSIS — L239 Allergic contact dermatitis, unspecified cause: Secondary | ICD-10-CM | POA: Diagnosis not present

## 2022-06-28 ENCOUNTER — Encounter: Payer: Self-pay | Admitting: Internal Medicine

## 2022-06-28 DIAGNOSIS — R911 Solitary pulmonary nodule: Secondary | ICD-10-CM | POA: Insufficient documentation

## 2022-06-28 NOTE — Assessment & Plan Note (Signed)
We discussed her CT and we will plan follow-up imaging in 3 months, approximately August 2024.

## 2022-06-28 NOTE — Assessment & Plan Note (Signed)
Stable ILD based on most recent chest CT, She has not wanted to intervene with antifibrotic's.  We will continue to follow.

## 2022-07-06 ENCOUNTER — Other Ambulatory Visit: Payer: Self-pay

## 2022-07-06 MED ORDER — AMLODIPINE BESYLATE 5 MG PO TABS: 5.0000 mg | ORAL_TABLET | Freq: Every day | ORAL | 0 refills | Status: DC

## 2022-07-12 ENCOUNTER — Inpatient Hospital Stay: Admission: RE | Admit: 2022-07-12 | Payer: Medicare Other | Source: Ambulatory Visit

## 2022-07-14 ENCOUNTER — Ambulatory Visit: Payer: Medicare Other | Admitting: Internal Medicine

## 2022-07-28 ENCOUNTER — Ambulatory Visit: Payer: Medicare Other | Attending: Internal Medicine | Admitting: Internal Medicine

## 2022-07-28 ENCOUNTER — Telehealth: Payer: Self-pay | Admitting: Internal Medicine

## 2022-07-28 ENCOUNTER — Encounter: Payer: Self-pay | Admitting: Internal Medicine

## 2022-07-28 VITALS — BP 146/70 | HR 51 | Ht 62.0 in | Wt 110.2 lb

## 2022-07-28 DIAGNOSIS — I10 Essential (primary) hypertension: Secondary | ICD-10-CM | POA: Diagnosis not present

## 2022-07-28 DIAGNOSIS — I2510 Atherosclerotic heart disease of native coronary artery without angina pectoris: Secondary | ICD-10-CM

## 2022-07-28 DIAGNOSIS — R001 Bradycardia, unspecified: Secondary | ICD-10-CM | POA: Diagnosis not present

## 2022-07-28 DIAGNOSIS — E785 Hyperlipidemia, unspecified: Secondary | ICD-10-CM | POA: Diagnosis not present

## 2022-07-28 DIAGNOSIS — I2584 Coronary atherosclerosis due to calcified coronary lesion: Secondary | ICD-10-CM

## 2022-07-28 MED ORDER — REPATHA SURECLICK 140 MG/ML ~~LOC~~ SOAJ: 1.0000 mL | SUBCUTANEOUS | 0 refills | Status: DC

## 2022-07-28 NOTE — Patient Instructions (Signed)
Medication Instructions:  NO CHANGES  *If you need a refill on your cardiac medications before your next appointment, please call your pharmacy*   Lab Work: Lab work to check cholesterol today   If you have labs (blood work) drawn today and your tests are completely normal, you will receive your results only by: MyChart Message (if you have MyChart) OR A paper copy in the mail If you have any lab test that is abnormal or we need to change your treatment, we will call you to review the results.    Follow-Up: At [facility], you and your health needs are our priority.  As part of our continuing mission to provide you with exceptional heart care, we have created designated Provider Care Teams.  These Care Teams include your primary Cardiologist (physician) and Advanced Practice Providers (APPs -  Physician Assistants and Nurse Practitioners) who all work together to provide you with the care you need, when you need it.  We recommend signing up for the patient portal called "MyChart".  Sign up information is provided on this After Visit Summary.  MyChart is used to connect with patients for Virtual Visits (Telemedicine).  Patients are able to view lab/test results, encounter notes, upcoming appointments, etc.  Non-urgent messages can be sent to your provider as well.   To learn more about what you can do with MyChart, go to [email or web address].    Your next appointment:    12 months with Dr. Rennis Golden

## 2022-07-28 NOTE — Progress Notes (Signed)
07/28/2022 Nicole [name] 07/08/1940 [id number]  CC: Follow-up  HPI:   Nicole [name] is a 82 y.o. female patient of Dr Rennis Golden, with a PMH below who presents today for a blood pressure check.  I last saw Nicole [name] about 1 month ago when we added amlodipine 5mg  each day.  Today she has no complaints.  At her last visit she brought her home BP cuff, which was found to be accurate within 10 points.  Since the addition of amlodipine her home readings have dropped significantly, in that now they average in the 130s systolic.  Per patient there was only one reading in the 150s systolic and nothing higher.  She does not use tobacco products or drink alcohol. She drinks minimal caffeine.  She does not add salt to her cooking, although she does eat some prepared foods.  She is going to her local [employer] 3-4 times per week and getting in a good workout of stair stepper and treadmill.  Because of a torn rotator cuff she is not able to do much in the way of weights.    Nicole [name] returns today for follow-up. She reports she's been significantly fatigued for the past several months and this is unlike her. It appears that her mood is somewhat depressed. She continues to exercise but does not seem to be helping her. She reports having her thyroid checked fairly regularly and maybe her levothyroxine was increased recently to 50 g. Blood pressure has been well controlled. She currently is being treated for sinus infection on amoxicillin. She has had some lightheadedness and dizziness. She never started Livalo due to questions about the cholesterol medicine and liver function. She does have a history of mild abnormalities in her LFTs. This of course was the reason that I recommended that medication at our last office visit. She was also provided with samples which she has no idea where they are at.  Nicole [name] returns today for follow-up. I previously tried her on low-dose Livalo since she had  a history of elevated liver enzymes, but a recheck showed that this also caused her liver enzymes to rise. She was then taken off of this. She's currently asymptomatic. Blood pressure is mildly elevated today.  06/07/2015  Nicole [name] returns today for follow-up. She reports that recently she's been more depressed. She see her therapist but he is apparently got very busy and she's not been able to see him recently. She's not currently on any medicine for depression. She did have some chest discomfort which was attributed to reflux. It seems positional and worse if she laid on one side after eating at night. She's had some relief on omeprazole 20 mg twice a day. We have adjusted her medication including adding chlorthalidone instead of hydrochlorothiazide. This is helped blood pressure and we've discontinued her beta blocker. Heart rate is, per the 40s to the 60s. She reports no change in symptoms.  07/14/2016  Nicole [name] was seen today follow-up. She recently underwent shoulder surgery and required additional surgery and she continues to have slow healing wearing a brace. She's had good control of her hypertension and has been losing some weight. She relates this to her surgery as well as ongoing problems with depression. She seems to be struggling with some relationship problems and is in alcoholic's anonymous. She denies any current alcohol use. She also denies any chest pain or worsening shortness of breath.  05/02/2019  Nicole [name] is seen in follow-up today.  She is fatigued and notably grieving after the loss of her husband who was a patient of mine as well back in September.  In a since then she has had much difficulty sleeping at night.  She had a sleep study however the results are still pending.  Blood pressures well controlled today.  Notably her lipids were quite elevated recently.  She has been intolerant to statins due to myalgias and her total cholesterol was 254, triglycerides 127, HDL 50  and LDL 181.  She reports this is longstanding and has not been able to get her numbers much lower.  Results are very concerning for possible familial hyperlipidemia.  08/18/2019  I had the pleasure to see Nicole [name] back today.  She continues to grieve over the loss of her husband.  She has a counseling appointment tomorrow.  She has responded very well to Repatha.  Total cholesterol now is 170, triglycerides 124, HDL 54 and LDL of 94, down from 181.  09/29/2021  Nicole [name] returns today for follow-up.  She seems to be doing much better on Repatha.  Cholesterol has improved significantly with total 154, triglycerides 147, HDL 55 and LDL 74.  She does not have the side effects that she experienced with statins.  She has had some pulmonary issues.  She was seen by pulmonary and noted on CT to have fibrosis as well as probable bronchiectasis with atypical infection.  Over the past week she has had some worsening shortness of breath, fever and drainage with nonproductive cough.  She has taken Mucinex for this with minimal relief.  She was noted to be hypotensive today at 90/60.  She reported she did not take her blood pressure medicines this morning.  07/28/2022  Nicole [name] is here today for follow-up.  Overall she says she is doing fairly well.  She has recently had some more pulmonary evaluations including a CT scan which showed a nodule.  She has a follow-up scan next week and will follow-up with Dr. Maple Hudson.  She denies any chest pain or worsening productive cough or shortness of breath.  She is overdue for lipids.  Her last cholesterol check was in December which showed total 145, HDL 51, triglycerides 154 and LDL 68.  She is on Repatha and is tolerating it well.   Current Outpatient Medications  Medication Sig Dispense Refill   amLODipine (NORVASC) 5 MG tablet Take 1 tablet (5 mg total) by mouth daily. 90 tablet 0   Calcium Carbonate-Vitamin D (CALCIUM-VITAMIN D3 PO) Take 15 mLs by mouth 2 (two)  times daily.     CINNAMON PO Take 1 capsule by mouth 2 (two) times daily.     clonazePAM (KLONOPIN) 1 MG tablet Take 0.5 mg by mouth 2 (two) times daily as needed for anxiety.     denosumab (PROLIA) 60 MG/ML SOSY injection Inject 60 mg into the skin every 6 (six) months.     Diclofenac Sodium 1 % CREA 2 gram qid prn 120 g 11   Evolocumab (REPATHA SURECLICK) 140 MG/ML SOAJ INJECT 1 DOSE INTO THE SKIN EVERY 14 DAYS 2 mL 11   HYDROcodone-acetaminophen (NORCO/VICODIN) 5-325 MG tablet Take 1 tablet by mouth every 6 (six) hours as needed for up to 15 doses for severe pain. 15 tablet 0   levothyroxine (SYNTHROID) 88 MCG tablet Take 88 mcg by mouth daily.     lidocaine-prilocaine (EMLA) cream 1 gram qid prn 30 g 11   MILK THISTLE PO Take 1 capsule by mouth  2 (two) times daily.     Multiple Vitamin (MULTIVITAMIN WITH MINERALS) TABS tablet Take 1 tablet by mouth daily.     olmesartan (BENICAR) 40 MG tablet olmesartan 40 mg tablet  TAKE 1 TABLET BY MOUTH EVERY DAY     pregabalin (LYRICA) 50 MG capsule Take 3 capsules (150 mg total) by mouth at bedtime. 90 capsule 2   No current facility-administered medications for this visit.    Allergies  Allergen Reactions   Keppra [Levetiracetam]     Depression   Other     She is allergic to an antibiotic for uti that begins with "M"   Rosuvastatin Calcium Diarrhea    Muscle aches   Statins Other (See Comments)    Muscle aches   Lamictal [Lamotrigine] Rash    Past Medical History:  Diagnosis Date   Anxiety    Arthritis    Cancer (HCC)    Basal cell   GERD (gastroesophageal reflux disease)    HTN (hypertension)    Hyperlipemia    Hypothyroidism    Increased liver enzymes    PONV (postoperative nausea and vomiting)     Blood pressure (!) 146/70, pulse (!) 51, height 5\' 2"  (1.575 m), weight 110 lb 3.2 oz (50 kg), SpO2 98 %.   EXAM: General appearance: alert and no distress Neck: no carotid bruit, no JVD, and thyroid not enlarged, symmetric, no  tenderness/mass/nodules Lungs: clear to auscultation bilaterally Heart: regular rate and rhythm, S1, S2 normal, no murmur, click, rub or gallop Abdomen: soft, non-tender; bowel sounds normal; no masses,  no organomegaly Extremities: extremities normal, atraumatic, no cyanosis or edema Pulses: 2+ and symmetric Skin: Skin color, texture, turgor normal. No rashes or lesions Neurologic: Grossly normal Psych: Pleasant   EKG: EKG Interpretation Date/Time:  Friday July 28 2022 10:53:06 EDT Ventricular Rate:  51 PR Interval:  134 QRS Duration:  92 QT Interval:  452 QTC Calculation: 416 R Axis:   -2  Text Interpretation: Sinus bradycardia with Premature atrial complexes Minimal voltage criteria for LVH, may be normal variant ( R in aVL ) When compared with ECG of 29-Apr-2008 12:28, Premature atrial complexes are now Present Questionable change in QRS duration Confirmed by Zoila Shutter [phone]) on 07/28/2022 11:02:39 AM    IMPRESSION: Hypertension-uncontrolled Atypical chest pain Possible familial dyslipidemia with statin intolerance due to nonalcoholic fatty liver disease, elevated LFTs and myalgias GERD Pulmonary fibrosis/bronchiectasis Coronary artery calcification  PLAN:   1.  Mrs. Delgiudice continues to denies any chest pain or worsening shortness of breath.  She does have two-vessel coronary calcification and recently was found to have a pulmonary mass which is going to be rescanned next week.  Blood pressure was a little elevated today however she says at times the blood pressure can run very low.  She will need repeat lipids which we will get today including NMR and LP(a) and I will contact her with those results.  Follow-up with me annually or sooner as necessary.  Chrystie Nose, MD, [facility], FACP  [place]  [facility]  Medical Director of the [facility]  Cardiovascular Risk Reduction Clinic Diplomate of the American Board of Clinical Lipidology Attending  Cardiologist  Direct Dial: [phone]  Fax: [phone]  Website:  [email or web address]

## 2022-07-28 NOTE — Telephone Encounter (Signed)
Faxed PAP for Repatha to [employer] at [phone] 

## 2022-07-30 LAB — NMR, LIPOPROFILE
Cholesterol, Total: 134 mg/dL (ref 100–199)
HDL Particle Number: 34.9 umol/L (ref >=30.5)
HDL-C: 41 mg/dL (ref >39)
LDL Particle Number: 812 nmol/L (ref <1000)
LDL Size: 19.8 nm — ABNORMAL LOW (ref >20.5)
LDL-C (NIH Calc): 68 mg/dL (ref 0–99)
LP-IR Score: 57 — ABNORMAL HIGH (ref <=45)
Small LDL Particle Number: 599 nmol/L — ABNORMAL HIGH (ref <=527)
Triglycerides: 142 mg/dL (ref 0–149)

## 2022-07-31 ENCOUNTER — Encounter: Payer: Self-pay | Admitting: Internal Medicine

## 2022-08-01 ENCOUNTER — Ambulatory Visit (HOSPITAL_COMMUNITY)
Admission: RE | Admit: 2022-08-01 | Discharge: 2022-08-01 | Disposition: A | Discharge status: Home / Self Care | Payer: Medicare Other | Source: Ambulatory Visit | Attending: Internal Medicine | Admitting: Internal Medicine

## 2022-08-01 DIAGNOSIS — I700 Atherosclerosis of aorta: Secondary | ICD-10-CM | POA: Diagnosis not present

## 2022-08-01 DIAGNOSIS — R911 Solitary pulmonary nodule: Secondary | ICD-10-CM | POA: Insufficient documentation

## 2022-08-01 DIAGNOSIS — R918 Other nonspecific abnormal finding of lung field: Secondary | ICD-10-CM | POA: Diagnosis not present

## 2022-08-03 ENCOUNTER — Telehealth: Payer: Self-pay | Admitting: Internal Medicine

## 2022-08-03 NOTE — Telephone Encounter (Signed)
Pt calling into get CT Results

## 2022-08-03 NOTE — Telephone Encounter (Signed)
PA approval notification faxed to [employer] at [phone]

## 2022-08-07 NOTE — Telephone Encounter (Signed)
Dr. Maple Hudson please on advise on CT results?

## 2022-08-08 NOTE — Telephone Encounter (Signed)
Patient is returning phone call. Patient phone number is [phone].

## 2022-08-08 NOTE — Telephone Encounter (Signed)
Pt. Calling back back to speak to nurse about CT results

## 2022-08-09 ENCOUNTER — Encounter: Payer: Self-pay | Admitting: *Deleted

## 2022-08-09 NOTE — Telephone Encounter (Signed)
Patient is returning phone call. Patient phone number is [phone].

## 2022-08-09 NOTE — Telephone Encounter (Signed)
ATC.  Left detailed message per DPR.  Also sent mychart message.

## 2022-08-10 NOTE — Telephone Encounter (Signed)
Pt. Calling back in has more medical questions for nurse please advise

## 2022-08-13 ENCOUNTER — Other Ambulatory Visit: Payer: Self-pay | Admitting: Neurology

## 2022-08-14 NOTE — Telephone Encounter (Signed)
Requested Prescriptions   Pending Prescriptions Disp Refills   pregabalin (LYRICA) 50 MG capsule [Pharmacy Med Name: PREGABALIN 50 MG CAPSULE] 90 capsule 2    Sig: TAKE 3 CAPSULES (150 MG TOTAL) BY MOUTH AT BEDTIME.   Last seen 03/14/22, next appt not scheduled   Dispenses  Routing to work in to fill Dispensed Days Supply Quantity Provider Pharmacy  PREGABALIN 50 MG CAPSULE 07/10/2022 30 90 each Levert Feinstein, MD [facility] [phone] - G...  PREGABALIN 50 MG CAPSULE 06/09/2022 30 90 each Levert Feinstein, MD [facility] [phone] - G...  PREGABALIN 50 MG CAPSULE 05/02/2022 30 90 each Levert Feinstein, MD [facility] [phone] - G...  PREGABALIN 50 MG CAPSULE 01/25/2022 30 90 each Huston Foley, MD [facility] [phone] - G...  PREGABALIN 50 MG CAPSULE 10/21/2021 30 90 each Levert Feinstein, MD [facility] [phone] - G.Marland KitchenMarland Kitchen

## 2022-08-14 NOTE — Telephone Encounter (Signed)
I spoke with the patient. She is aware of her CT results. She wants to know if there is any additional testing that needs to be done. She said her husband had cancer. He had a CT and was told he was fine and then died in the hospital from double pneumonia. This worries her a lot. She said she saw her Cardiologist who said she may have had walking pneumonia and this is remnants of that. Could this be possible?

## 2022-08-14 NOTE — Telephone Encounter (Signed)
Her next scheduled appointment is not until February. I would be happy to have her brought in using a held spot sooner, so she can ask her questions and we can decide on any additional testing.

## 2022-08-15 NOTE — Telephone Encounter (Signed)
I have scheduled the patient an appt on 08/28/2022 at 10:30am and left her a message to call back and confirm.

## 2022-08-16 NOTE — Telephone Encounter (Signed)
I spoke with the patient on 08/14/2022. See patient message from 08/09/2022.  Nothing further needed.

## 2022-08-16 NOTE — Telephone Encounter (Signed)
I have left the patient another message for her to call the office back to confirm the appt on 08/28/2022.

## 2022-08-17 DIAGNOSIS — L03011 Cellulitis of right finger: Secondary | ICD-10-CM | POA: Diagnosis not present

## 2022-08-17 DIAGNOSIS — K130 Diseases of lips: Secondary | ICD-10-CM | POA: Diagnosis not present

## 2022-08-17 DIAGNOSIS — Z872 Personal history of diseases of the skin and subcutaneous tissue: Secondary | ICD-10-CM | POA: Diagnosis not present

## 2022-08-17 DIAGNOSIS — Z09 Encounter for follow-up examination after completed treatment for conditions other than malignant neoplasm: Secondary | ICD-10-CM | POA: Diagnosis not present

## 2022-08-18 NOTE — Telephone Encounter (Signed)
Attempted to contact the patient again. I have left another message. I will send her a letter notifying her of the appt.  Nothing further needed.

## 2022-08-23 ENCOUNTER — Other Ambulatory Visit (HOSPITAL_COMMUNITY): Payer: Self-pay

## 2022-08-23 NOTE — Telephone Encounter (Signed)
Called [employer] for update on PAP. They need PA approval on insurance letterhead.

## 2022-08-23 NOTE — Telephone Encounter (Signed)
Patient called to follow-up on the stats of getting her Repatha medication.

## 2022-08-23 NOTE — Telephone Encounter (Signed)
Faxed Repatha PAP with insurance approval letter to [employer]

## 2022-08-23 NOTE — Telephone Encounter (Signed)
Called pt to let her know that the prior Nicole [name] has been sent. Waiting on approval.

## 2022-08-26 NOTE — Progress Notes (Signed)
HPI F never smoker,followed for lung Nodules, Bronchiectasis, ILD/UIP  complicated by  HTN, GERD, Hepatic Steatosis, Hypothyroid, PONV, Arthritis, Hyperlipidemia, CAD, Aortic Atherosclerosis,   (widow of my patient Chakita Mcgraw who died with complications of thymic cancer, esophageal cancer, aspiration pneumonia, chronic lung disease.) Lab- Quatiferon TB Gold 11/27/19- Negative HRCT chest 01/07/20-  considered probable usual interstitial pneumonia (UIP) mucoid impaction in the lingula accounting for the micro and macronodularity in this region. 2. Aortic atherosclerosis, in addition to two vessel coronary artery disease. HST 04/24/19- AHI 1.2/ hr PFT 04/12/20- minimal obstruction(possible), no response to BD, Nl lung volumes, minimal DLCO deficit UIP w/u labs 3/29- negative. =========================================================   06/01/22- 81 yoF never smoker,followed for lung Nodules, Bronchiectasis, ILD/UIP  complicated by  HTN, GERD, Hepatic Steatosis, Hypothyroid, PONV, Arthritis, Hyperlipidemia, CAD, Aortic Atherosclerosis,   (widow of my patient Joy Reiger who died with complications of thymic cancer, esophageal cancer, aspiration pneumonia, chronic lung disease.) -clonazepam 1 mg, doxepin 50, Norco, temazepam 15 ------Pt states she is doing great She has wanted to avoid intervention for ILD. She has felt stable, reporting little cough.  Denies night sweats, adenopathy. We reviewed her April chest CT.  She has not wanted to do anything about the interstitial lung disease which we are watching.  A 7 mm nodule is noticed in the posterior left lower lobe and we will follow radiology recommendation to follow-up chest CT in 3 months. HRCT chest 05/15/22- IMPRESSION: 1. Pulmonary parenchymal pattern of fibrosis appears stable from 09/08/2021 and stable to minimally progressive from 01/07/2020. Findings are categorized as probable UIP per consensus guidelines: Diagnosis of Idiopathic Pulmonary  Fibrosis: An Official ATS/ERS/JRS/ALAT Clinical Practice Guideline. Am Rosezetta Schlatter Crit Care Med Vol 198, Iss 5, [phone], Sep 23 2016. 2. New 7 mm nodular density in the posterior left lower lobe. Recommend follow-up CT chest without contrast in 3 months as malignancy cannot be excluded. 3. Bronchiectasis with waxing and waning chronic mucoid impaction in the lingula, indicative of an atypical infectious process. 4. Cirrhosis. 5. Aortic atherosclerosis (ICD10-I70.0). Coronary artery calcification.  08/28/22- 81 yoF never smoker,followed for lung Nodules, Bronchiectasis, ILD/UIP  complicated by  HTN, GERD, Hepatic Steatosis, Hypothyroid, PONV, Arthritis, Hyperlipidemia, CAD, Aortic Atherosclerosis,   (widow of my patient Katharin Schneider who died with complications of thymic cancer, esophageal cancer, aspiration pneumonia, chronic lung disease.) -clonazepam 1 mg, doxepin 50, Norco, temazepam 15 She has wanted to avoid intervention for ILD. She reports feeling well- no productive cough.  Bothersome postnasal drip. Suggested trial of claritin. Discussed most recent CT, which indicates nodule appears smaller. We will continue to follow. She asks comparison to husband, who had a nodular pneumonia. She is not having fever/ sweats. CTchest 08/01/22 IMPRESSION: Previously seen nodule along the left posterior hemidiaphragm difficult to visualize on today's study, and appears smaller measuring 4 mm compared to 7 mm previously. No follow-up needed if patient is low-risk.This recommendation follows the consensus statement: Guidelines for Management of Incidental Pulmonary Nodules Detected on CT Images: From the Fleischner Society 2017; Radiology 2017; 284:228-243  Stable mucoid impaction and nodular densities in the lingula compatible with chronic/indolent infection such as MAI. Scattered coronary artery disease. Aortic Atherosclerosis (ICD10-I70.0).   ROS-see HPI   + = positive Constitutional:    weight  loss, night sweats, fevers, chills, fatigue, lassitude. HEENT:    headaches, difficulty swallowing, tooth/dental problems, sore throat,       sneezing, itching, ear ache, nasal congestion, post nasal drip, snoring CV:    chest pain,  orthopnea, PND, swelling in lower extremities, anasarca,                                   dizziness, palpitations Resp:   shortness of breath with exertion or at rest.                productive cough,   non-productive cough, coughing up of blood.              change in color of mucus.  wheezing.   Skin:    rash or lesions. GI:  No-   heartburn, indigestion, abdominal pain, nausea, vomiting, diarrhea,                 change in bowel habits, loss of appetite GU: dysuria, change in color of urine, no urgency or frequency.   flank pain. MS:   joint pain, stiffness, decreased range of motion, back pain. Neuro-    Peripheral neuropathy Psych:  change in mood or affect.  depression or anxiety.   memory loss.  OBJ- Physical Exam General- Alert, Oriented, Affect-appropriate, Distress- none acute, + slender Skin- rash-none, lesions- none, excoriation- none Lymphadenopathy- none Head- atraumatic            Eyes- Gross vision intact, PERRLA, conjunctivae and secretions clear            Ears- Hearing, canals-normal            Nose- Clear, no-Septal dev, mucus, polyps, erosion, perforation             Throat- Mallampati II , mucosa clear , drainage- none, tonsils- atrophic Neck- flexible , trachea midline, no stridor , thyroid nl, carotid no bruit Chest - symmetrical excursion , unlabored           Heart/CV- RRR , no murmur , no gallop  , no rub, nl s1 s2                           - JVD- none , edema- none, stasis changes- none, varices- none           Lung- clear to P&A-no crackles heard, wheeze- none, cough- none , dullness-none, rub- none           Chest wall-  Abd-  Br/ Gen/ Rectal- Not done, not indicated Extrem- cyanosis- none, clubbing, none, atrophy- none,  strength- nl Neuro- grossly intact to observation

## 2022-08-28 ENCOUNTER — Encounter: Payer: Self-pay | Admitting: Internal Medicine

## 2022-08-28 ENCOUNTER — Ambulatory Visit: Payer: Medicare Other | Admitting: Internal Medicine

## 2022-08-28 VITALS — BP 112/64 | HR 51 | Temp 98.3°F | Ht 62.0 in | Wt 107.8 lb

## 2022-08-28 DIAGNOSIS — J479 Bronchiectasis, uncomplicated: Secondary | ICD-10-CM

## 2022-08-28 DIAGNOSIS — R911 Solitary pulmonary nodule: Secondary | ICD-10-CM | POA: Diagnosis not present

## 2022-08-28 MED ORDER — REPATHA SURECLICK 140 MG/ML ~~LOC~~ SOAJ: 1.0000 mL | SUBCUTANEOUS | Status: DC

## 2022-08-28 NOTE — Assessment & Plan Note (Signed)
Acting like a resolving inflammatory nodule Plan- CT in 6 months

## 2022-08-28 NOTE — Telephone Encounter (Signed)
Call to [employer] at [phone]. Unable to get to a live agent.  LM to call if patient Repatha approved and sent.   Call to patient to inform call was made and waiting for response.

## 2022-08-28 NOTE — Telephone Encounter (Signed)
Patient was calling to see if the info was sent to pharmacy. So that she can pick up her medication. Please advise

## 2022-08-28 NOTE — Patient Instructions (Signed)
Glad you are feeling well  We suggested trying a nonsedating otc antihistamine like Claritin for your post nasal drip  Order- schedule CT chest no contrast  future in 6 months    dx lung nodules

## 2022-08-28 NOTE — Telephone Encounter (Signed)
Call to pharmacy and states went through insurance $151.  Is this correct or will it be less if the paperwork goes through?

## 2022-08-28 NOTE — Telephone Encounter (Signed)
If patient was approved for patient assistance than it would not go through her insurance and would not be filled at pharmacy. Medication would be shipped directly to patient. You need to call [employer] PAP for update to see if she was approved.

## 2022-08-28 NOTE — Telephone Encounter (Signed)
Called to patient and let her know that it was all sent.  The RX was sent to local pharmacy and will check to see if goes through and call her back once checked with pharmacy.

## 2022-08-28 NOTE — Assessment & Plan Note (Signed)
Latest CT does not show active progression, and she denies productive cough. Plan- continue to follow.

## 2022-08-28 NOTE — Addendum Note (Signed)
Addended by: Candie Chroman on: 08/28/2022 10:03 AM   Modules accepted: Orders

## 2022-08-30 NOTE — Telephone Encounter (Signed)
Spoke with representative Joyice Faster) regarding authorization of Repatha. States needs to call separate number for the [employer] at [phone]. Spoke with Alcario Drought regarding status. States needs letter from insurance.  Advised this was sent on August 1st.  She re looked   Spoke with Alcario Drought to check into the status and found paperwork and attached to the pending PA.  She states it is now under review and can take up to 2 business days. They will reach out to e patient with determination. A letter will also be sent to patient and office with determination as well.   Call to the patient and advised her of information.

## 2022-09-01 ENCOUNTER — Telehealth: Payer: Self-pay | Admitting: Internal Medicine

## 2022-09-01 ENCOUNTER — Other Ambulatory Visit: Payer: Self-pay | Admitting: *Deleted

## 2022-09-01 ENCOUNTER — Ambulatory Visit (HOSPITAL_COMMUNITY): Payer: Medicare Other

## 2022-09-01 MED ORDER — AMLODIPINE BESYLATE 5 MG PO TABS: 5.0000 mg | ORAL_TABLET | Freq: Every day | ORAL | 3 refills | Status: DC

## 2022-09-01 NOTE — Telephone Encounter (Signed)
*  STAT* If patient is at the pharmacy, call can be transferred to refill team.   1. Which medications need to be refilled? (please list name of each medication and dose if known)   amLODipine (NORVASC) 5 MG tablet     2. Would you like to learn more about the convenience, safety, & potential cost savings by using the [facility]? No   3. Are you open to using the [facility] (Type [facility] )No   4. Which pharmacy/location (including street and city if local pharmacy) is medication to be sent to?[facility] - [place], [place] - [address]    5. Do they need a 30 day or 90 day supply? 90 day   Pt was seen on 7/5

## 2022-09-14 NOTE — Telephone Encounter (Signed)
Patient approved until 01/23/23 First order was placed last week.

## 2022-09-21 ENCOUNTER — Telehealth: Payer: Self-pay | Admitting: Internal Medicine

## 2022-09-21 NOTE — Telephone Encounter (Signed)
Patient states her BP has been good range  8/28  115/70    8/27  146/79 8/24  141/77    8/233  122/73 8/22  123/77 8/20  128/73 8/17  130/70 8/18 110/67 8/16  116/71 8/15  136/74 8/6   129/71  These are all before medications.  She ask does she need to continue the amlodipine 5mg  Daily?   If so she will need a refill, need to verify pharmacy when call back

## 2022-09-21 NOTE — Telephone Encounter (Signed)
Pt c/o medication issue:  1. Name of Medication: amLODipine (NORVASC) 5 MG tablet   2. How are you currently taking this medication (dosage and times per day)?   Take 1 tablet (5 mg total) by mouth daily.    3. Are you having a reaction (difficulty breathing--STAT)? no  4. What is your medication issue? Patient called wanting to know if Dr. Rennis Golden wants her to continue taking this medication.

## 2022-09-22 MED ORDER — AMLODIPINE BESYLATE 5 MG PO TABS: 5.0000 mg | ORAL_TABLET | Freq: Every day | ORAL | 3 refills | Status: DC

## 2022-09-22 NOTE — Telephone Encounter (Signed)
Returned call to pt with Dr. Blanchie Dessert advice below.  Yes .[name] Continue amlodipine. BP looks good because of medication. Ok to refill  Dr Rexene Edison

## 2022-09-26 ENCOUNTER — Other Ambulatory Visit: Payer: Self-pay

## 2022-09-26 ENCOUNTER — Other Ambulatory Visit: Payer: Self-pay | Admitting: Neurology

## 2022-09-26 MED ORDER — PREGABALIN 50 MG PO CAPS: 150.0000 mg | ORAL_CAPSULE | Freq: Every day | ORAL | 2 refills | Status: DC

## 2022-09-26 NOTE — Telephone Encounter (Signed)
Pt is requesting a refill for pregabalin (LYRICA) 50 MG capsule.  Pharmacy: [facility] [phone]

## 2022-09-27 ENCOUNTER — Telehealth: Payer: Self-pay | Admitting: Internal Medicine

## 2022-09-27 NOTE — Telephone Encounter (Signed)
Follow Up:        Patient says she is calling to find out if she needs to continue taking her medicine. She said she had called about this on 09-21-22    Pt c/o medication issue:  1. Name of Medication: Amlodipine  2. How are you currently taking this medication (dosage and times per day)?   3. Are you having a reaction (difficulty breathing--STAT)?   4. What is your medication issue? Patient wants to know if she is supposed to continue taking the Amlodipine? [name]

## 2022-09-27 NOTE — Telephone Encounter (Signed)
See previous message regarding this.  Unsure how it did not get relayed to patient but LM that she is to continue the medication and that the RX was sent to pharmacy. Call if any further questions.

## 2022-10-11 MED ORDER — PREGABALIN 50 MG PO CAPS: 150.0000 mg | ORAL_CAPSULE | Freq: Every day | ORAL | 2 refills | Status: DC

## 2022-10-11 NOTE — Addendum Note (Signed)
Addended by: Bertram Savin on: 10/11/2022 03:11 PM   Modules accepted: Orders

## 2022-11-06 DIAGNOSIS — N3001 Acute cystitis with hematuria: Secondary | ICD-10-CM | POA: Diagnosis not present

## 2022-11-06 DIAGNOSIS — Z20822 Contact with and (suspected) exposure to covid-19: Secondary | ICD-10-CM | POA: Diagnosis not present

## 2022-11-06 DIAGNOSIS — R052 Subacute cough: Secondary | ICD-10-CM | POA: Diagnosis not present

## 2022-11-17 DIAGNOSIS — J310 Chronic rhinitis: Secondary | ICD-10-CM | POA: Diagnosis not present

## 2022-11-17 DIAGNOSIS — R319 Hematuria, unspecified: Secondary | ICD-10-CM | POA: Diagnosis not present

## 2022-11-17 DIAGNOSIS — R300 Dysuria: Secondary | ICD-10-CM | POA: Diagnosis not present

## 2022-11-17 DIAGNOSIS — N390 Urinary tract infection, site not specified: Secondary | ICD-10-CM | POA: Diagnosis not present

## 2022-11-21 ENCOUNTER — Other Ambulatory Visit: Payer: Self-pay | Admitting: Neurology

## 2022-11-28 DIAGNOSIS — H04123 Dry eye syndrome of bilateral lacrimal glands: Secondary | ICD-10-CM | POA: Diagnosis not present

## 2022-11-28 DIAGNOSIS — Z961 Presence of intraocular lens: Secondary | ICD-10-CM | POA: Diagnosis not present

## 2022-11-29 NOTE — Telephone Encounter (Signed)
Requested Prescriptions   Pending Prescriptions Disp Refills   pregabalin (LYRICA) 50 MG capsule [Pharmacy Med Name: PREGABALIN 50 MG CAPSULE] 90 capsule 2    Sig: TAKE 3 CAPSULES BY MOUTH AT BEDTIME   Last seen 03/14/22 Next appt not scheduled Dispenses   Dispensed Days Supply Quantity Provider Pharmacy  PREGABALIN 50 MG CAPSULE 11/14/2022 30 90 each Anson Fret, MD [facility] [phone] - G...  PREGABALIN 50 MG CAPSULE 09/28/2022 30 90 each Anson Fret, MD [facility] [phone] - G...  PREGABALIN 50 MG CAPSULE 08/15/2022 30 90 each Anson Fret, MD [facility] [phone] - G...  PREGABALIN 50 MG CAPSULE 07/10/2022 30 90 each Levert Feinstein, MD [facility] [phone] - G...  PREGABALIN 50 MG CAPSULE 06/09/2022 30 90 each Levert Feinstein, MD [facility] [phone] - G...  PREGABALIN 50 MG CAPSULE 05/02/2022 30 90 each Levert Feinstein, MD [facility] [phone] - G...  PREGABALIN 50 MG CAPSULE 01/25/2022 30 90 each Huston Foley, MD [facility] [phone] - G.Marland KitchenMarland Kitchen

## 2022-12-11 DIAGNOSIS — Z87440 Personal history of urinary (tract) infections: Secondary | ICD-10-CM | POA: Diagnosis not present

## 2022-12-11 DIAGNOSIS — Z20822 Contact with and (suspected) exposure to covid-19: Secondary | ICD-10-CM | POA: Diagnosis not present

## 2022-12-11 DIAGNOSIS — J40 Bronchitis, not specified as acute or chronic: Secondary | ICD-10-CM | POA: Diagnosis not present

## 2022-12-11 DIAGNOSIS — R319 Hematuria, unspecified: Secondary | ICD-10-CM | POA: Diagnosis not present

## 2022-12-15 DIAGNOSIS — R3129 Other microscopic hematuria: Secondary | ICD-10-CM | POA: Diagnosis not present

## 2022-12-15 DIAGNOSIS — N8110 Cystocele, unspecified: Secondary | ICD-10-CM | POA: Diagnosis not present

## 2022-12-25 ENCOUNTER — Other Ambulatory Visit (HOSPITAL_COMMUNITY): Payer: Self-pay | Admitting: Nurse Practitioner

## 2022-12-25 DIAGNOSIS — R3129 Other microscopic hematuria: Secondary | ICD-10-CM

## 2023-01-12 ENCOUNTER — Ambulatory Visit (HOSPITAL_COMMUNITY): Payer: Medicare Other

## 2023-01-29 DIAGNOSIS — R109 Unspecified abdominal pain: Secondary | ICD-10-CM | POA: Diagnosis not present

## 2023-01-29 DIAGNOSIS — M810 Age-related osteoporosis without current pathological fracture: Secondary | ICD-10-CM | POA: Diagnosis not present

## 2023-01-29 DIAGNOSIS — E1121 Type 2 diabetes mellitus with diabetic nephropathy: Secondary | ICD-10-CM | POA: Diagnosis not present

## 2023-01-29 DIAGNOSIS — E039 Hypothyroidism, unspecified: Secondary | ICD-10-CM | POA: Diagnosis not present

## 2023-01-29 DIAGNOSIS — N181 Chronic kidney disease, stage 1: Secondary | ICD-10-CM | POA: Diagnosis not present

## 2023-01-29 DIAGNOSIS — Z0000 Encounter for general adult medical examination without abnormal findings: Secondary | ICD-10-CM | POA: Diagnosis not present

## 2023-01-29 DIAGNOSIS — E7800 Pure hypercholesterolemia, unspecified: Secondary | ICD-10-CM | POA: Diagnosis not present

## 2023-01-29 DIAGNOSIS — I129 Hypertensive chronic kidney disease with stage 1 through stage 4 chronic kidney disease, or unspecified chronic kidney disease: Secondary | ICD-10-CM | POA: Diagnosis not present

## 2023-01-31 ENCOUNTER — Other Ambulatory Visit: Payer: Self-pay | Admitting: Internal Medicine

## 2023-01-31 DIAGNOSIS — M810 Age-related osteoporosis without current pathological fracture: Secondary | ICD-10-CM

## 2023-01-31 DIAGNOSIS — Z1231 Encounter for screening mammogram for malignant neoplasm of breast: Secondary | ICD-10-CM

## 2023-02-02 ENCOUNTER — Ambulatory Visit (HOSPITAL_COMMUNITY): Payer: Medicare Other

## 2023-02-02 ENCOUNTER — Encounter (HOSPITAL_COMMUNITY): Payer: Self-pay

## 2023-02-08 ENCOUNTER — Telehealth: Payer: Self-pay | Admitting: Internal Medicine

## 2023-02-08 DIAGNOSIS — E785 Hyperlipidemia, unspecified: Secondary | ICD-10-CM

## 2023-02-08 NOTE — Telephone Encounter (Signed)
Pt states that she has used last of Repatha and needs for nurse to order more through [place]. Please advise

## 2023-02-09 NOTE — Telephone Encounter (Signed)
[employer] approved. Will mail letter to patient with info  Pharmacy Card  Card No. [id number]  Card Status Active  BIN [id number]  PCN [id number]  PC Group [id number]  Provider PDMI

## 2023-02-15 NOTE — Telephone Encounter (Signed)
Pt calling back wanting to know if medication will be mailed to her. Requesting cb

## 2023-02-16 ENCOUNTER — Ambulatory Visit (HOSPITAL_COMMUNITY)
Admission: RE | Admit: 2023-02-16 | Discharge: 2023-02-16 | Disposition: A | Discharge status: Home / Self Care | Payer: Medicare Other | Source: Ambulatory Visit | Attending: Nurse Practitioner | Admitting: Nurse Practitioner

## 2023-02-16 DIAGNOSIS — K838 Other specified diseases of biliary tract: Secondary | ICD-10-CM | POA: Diagnosis not present

## 2023-02-16 DIAGNOSIS — K5730 Diverticulosis of large intestine without perforation or abscess without bleeding: Secondary | ICD-10-CM | POA: Diagnosis not present

## 2023-02-16 DIAGNOSIS — J849 Interstitial pulmonary disease, unspecified: Secondary | ICD-10-CM | POA: Insufficient documentation

## 2023-02-16 DIAGNOSIS — R3129 Other microscopic hematuria: Secondary | ICD-10-CM | POA: Insufficient documentation

## 2023-02-16 DIAGNOSIS — I700 Atherosclerosis of aorta: Secondary | ICD-10-CM | POA: Diagnosis not present

## 2023-02-16 MED ORDER — IOHEXOL 300 MG/ML  SOLN
100.0000 mL | Freq: Once | INTRAMUSCULAR | Status: AC | PRN
  Administered 2023-02-16: 100 mL via INTRAVENOUS

## 2023-02-16 MED ORDER — REPATHA SURECLICK 140 MG/ML ~~LOC~~ SOAJ: SUBCUTANEOUS | 6 refills | Status: AC

## 2023-02-16 NOTE — Telephone Encounter (Signed)
Spoke with patient. Explained that [employer] grant has been approved and letter w/info mailed. She has not received. Explained that med will come from pharmacy, will not be mailed -- that would be if she got it from [facility]. Refilled Repatha until when she is due for annual visit (07/2023)

## 2023-02-16 NOTE — Addendum Note (Signed)
Addended by: Lindell Spar on: 02/16/2023 01:36 PM   Modules accepted: Orders

## 2023-02-26 ENCOUNTER — Telehealth: Payer: Self-pay | Admitting: Internal Medicine

## 2023-02-28 ENCOUNTER — Ambulatory Visit (HOSPITAL_COMMUNITY): Payer: Medicare Other

## 2023-03-02 NOTE — Progress Notes (Deleted)
 HPI F never smoker,followed for lung Nodules, Bronchiectasis, ILD/UIP  complicated by  HTN, GERD, Hepatic Steatosis, Hypothyroid, PONV, Arthritis, Hyperlipidemia, CAD, Aortic Atherosclerosis,   (widow of my patient Sharese Manrique who died with complications of thymic cancer, esophageal cancer, aspiration pneumonia, chronic lung disease.) Lab- Quatiferon TB Gold 11/27/19- Negative HRCT chest 01/07/20-  considered probable usual interstitial pneumonia (UIP) mucoid impaction in the lingula accounting for the micro and macronodularity in this region. 2. Aortic atherosclerosis, in addition to two vessel coronary artery disease. HST 04/24/19- AHI 1.2/ hr PFT 04/12/20- minimal obstruction(possible), no response to BD, Nl lung volumes, minimal DLCO deficit UIP w/u labs 3/29- negative. =================================================================================================  08/28/22- 81 yoF never smoker,followed for lung Nodules, Bronchiectasis, ILD/UIP  complicated by  HTN, GERD, Hepatic Steatosis, Hypothyroid, PONV, Arthritis, Hyperlipidemia, CAD, Aortic Atherosclerosis,   (widow of my patient Symphani Eckstrom who died with complications of thymic cancer, esophageal cancer, aspiration pneumonia, chronic lung disease.) -clonazepam 1 mg, doxepin 50, Norco, temazepam 15 She has wanted to avoid intervention for ILD. She reports feeling well- no productive cough.  Bothersome postnasal drip. Suggested trial of claritin. Discussed most recent CT, which indicates nodule appears smaller. We will continue to follow. She asks comparison to husband, who had a nodular pneumonia. She is not having fever/ sweats. CTchest 08/01/22 IMPRESSION: Previously seen nodule along the left posterior hemidiaphragm difficult to visualize on today's study, and appears smaller measuring 4 mm compared to 7 mm previously. No follow-up needed if patient is low-risk.This recommendation follows the consensus statement: Guidelines for  Management of Incidental Pulmonary Nodules Detected on CT Images: From the Fleischner Society 2017; Radiology 2017; 284:228-243  Stable mucoid impaction and nodular densities in the lingula compatible with chronic/indolent infection such as MAI. Scattered coronary artery disease. Aortic Atherosclerosis (ICD10-I70.0).  03/05/23-  82 yoF never smoker,followed for lung Nodules, Bronchiectasis, ILD/UIP  complicated by  HTN, GERD, Hepatic Steatosis, Hypothyroid, PONV, Arthritis, Hyperlipidemia, CAD, Aortic Atherosclerosis,   (widow of my patient Breckyn Troyer who died with complications of thymic cancer, esophageal cancer, aspiration pneumonia, chronic lung disease.) -clonazepam 1 mg, doxepin 50, Norco, temazepam 15 She has wanted to avoid intervention for ILD. HRCT chest 02/16/23 MPRESSION: 1. Pulmonary parenchymal findings favor chronic atypical mycobacterial infection (MAI) in the lingula superimposed on pulmonary parenchymal findings of mild basilar predominant fibrotic interstitial lung disease compatible with probable UIP. No compelling progression of ILD since 05/15/2022 high-resolution chest CT. Mild progression of ILD since baseline 01/07/2020 high-resolution chest CT. Findings are categorized as probable UIP per consensus guidelines: Diagnosis of Idiopathic Pulmonary Fibrosis: An Official ATS/ERS/JRS/ALAT Clinical Practice Guideline. Am Rosezetta Schlatter Crit Care Med Vol 198, Iss 5, [phone], Sep 23 2016. 2. Two-vessel coronary atherosclerosis. 3.  Aortic Atherosclerosis (ICD10-I70.0).   ROS-see HPI   + = positive Constitutional:    weight loss, night sweats, fevers, chills, fatigue, lassitude. HEENT:    headaches, difficulty swallowing, tooth/dental problems, sore throat,       sneezing, itching, ear ache, nasal congestion, post nasal drip, snoring CV:    chest pain, orthopnea, PND, swelling in lower extremities, anasarca,                                   dizziness, palpitations Resp:    shortness of breath with exertion or at rest.                productive cough,   non-productive cough, coughing up of  blood.              change in color of mucus.  wheezing.   Skin:    rash or lesions. GI:  No-   heartburn, indigestion, abdominal pain, nausea, vomiting, diarrhea,                 change in bowel habits, loss of appetite GU: dysuria, change in color of urine, no urgency or frequency.   flank pain. MS:   joint pain, stiffness, decreased range of motion, back pain. Neuro-    Peripheral neuropathy Psych:  change in mood or affect.  depression or anxiety.   memory loss.  OBJ- Physical Exam General- Alert, Oriented, Affect-appropriate, Distress- none acute, + slender Skin- rash-none, lesions- none, excoriation- none Lymphadenopathy- none Head- atraumatic            Eyes- Gross vision intact, PERRLA, conjunctivae and secretions clear            Ears- Hearing, canals-normal            Nose- Clear, no-Septal dev, mucus, polyps, erosion, perforation             Throat- Mallampati II , mucosa clear , drainage- none, tonsils- atrophic Neck- flexible , trachea midline, no stridor , thyroid nl, carotid no bruit Chest - symmetrical excursion , unlabored           Heart/CV- RRR , no murmur , no gallop  , no rub, nl s1 s2                           - JVD- none , edema- none, stasis changes- none, varices- none           Lung- clear to P&A-no crackles heard, wheeze- none, cough- none , dullness-none, rub- none           Chest wall-  Abd-  Br/ Gen/ Rectal- Not done, not indicated Extrem- cyanosis- none, clubbing, none, atrophy- none, strength- nl Neuro- grossly intact to observation

## 2023-03-05 ENCOUNTER — Other Ambulatory Visit (HOSPITAL_COMMUNITY): Payer: Medicare Other

## 2023-03-05 ENCOUNTER — Ambulatory Visit: Payer: Medicare Other | Admitting: Internal Medicine

## 2023-03-08 ENCOUNTER — Inpatient Hospital Stay: Admission: RE | Admit: 2023-03-08 | Payer: Medicare Other | Source: Ambulatory Visit

## 2023-03-08 ENCOUNTER — Ambulatory Visit: Payer: Medicare Other | Admitting: Internal Medicine

## 2023-03-18 ENCOUNTER — Other Ambulatory Visit: Payer: Self-pay | Admitting: Neurology

## 2023-03-19 NOTE — Telephone Encounter (Signed)
 Rx refilled.

## 2023-03-29 DIAGNOSIS — R3121 Asymptomatic microscopic hematuria: Secondary | ICD-10-CM | POA: Diagnosis not present

## 2023-03-29 DIAGNOSIS — N3946 Mixed incontinence: Secondary | ICD-10-CM | POA: Diagnosis not present

## 2023-04-02 DIAGNOSIS — K080 Exfoliation of teeth due to systemic causes: Secondary | ICD-10-CM | POA: Diagnosis not present

## 2023-04-22 NOTE — Progress Notes (Deleted)
 HPI F never smoker,followed for lung Nodules, Bronchiectasis, ILD/UIP  complicated by  HTN, GERD, Hepatic Steatosis, Hypothyroid, PONV, Arthritis, Hyperlipidemia, CAD, Aortic Atherosclerosis,   (widow of my patient Mysty Kielty who died with complications of thymic cancer, esophageal cancer, aspiration pneumonia, chronic lung disease.) Lab- Quatiferon TB Gold 11/27/19- Negative HRCT chest 01/07/20-  considered probable usual interstitial pneumonia (UIP) mucoid impaction in the lingula accounting for the micro and macronodularity in this region. 2. Aortic atherosclerosis, in addition to two vessel coronary artery disease. HST 04/24/19- AHI 1.2/ hr PFT 04/12/20- minimal obstruction(possible), no response to BD, Nl lung volumes, minimal DLCO deficit UIP w/u labs 3/29- negative. =========================================================  08/28/22- 81 yoF never smoker,followed for lung Nodules, Bronchiectasis, ILD/UIP  complicated by  HTN, GERD, Hepatic Steatosis, Hypothyroid, PONV, Arthritis, Hyperlipidemia, CAD, Aortic Atherosclerosis,   (widow of my patient Cameran Ahmed who died with complications of thymic cancer, esophageal cancer, aspiration pneumonia, chronic lung disease.) -clonazepam 1 mg, doxepin 50, Norco, temazepam 15 She has wanted to avoid intervention for ILD. She reports feeling well- no productive cough.  Bothersome postnasal drip. Suggested trial of claritin. Discussed most recent CT, which indicates nodule appears smaller. We will continue to follow. She asks comparison to husband, who had a nodular pneumonia. She is not having fever/ sweats. CTchest 08/01/22 IMPRESSION: Previously seen nodule along the left posterior hemidiaphragm difficult to visualize on today's study, and appears smaller measuring 4 mm compared to 7 mm previously. No follow-up needed if patient is low-risk.This recommendation follows the consensus statement: Guidelines for Management of Incidental Pulmonary  Nodules Detected on CT Images: From the Fleischner Society 2017; Radiology 2017; 284:228-243  Stable mucoid impaction and nodular densities in the lingula compatible with chronic/indolent infection such as MAI. Scattered coronary artery disease. Aortic Atherosclerosis (ICD10-I70.0).  04/24/23- 82 yoF never smoker,followed for lung Nodules, Bronchiectasis, ILD/UIP  complicated by  HTN, GERD, Hepatic Steatosis, Hypothyroid, PONV, Arthritis, Hyperlipidemia, CAD, Aortic Atherosclerosis,   (widow of my patient Celine Dishman who died with complications of thymic cancer, esophageal cancer, aspiration pneumonia, chronic lung disease.) -clonazepam 1 mg, doxepin 50, Norco, temazepam 15 She has wanted to avoid intervention for ILD.  HRCT chest 02/16/23 IMPRESSION: 1. Pulmonary parenchymal findings favor chronic atypical mycobacterial infection (MAI) in the lingula superimposed on pulmonary parenchymal findings of mild basilar predominant fibrotic interstitial lung disease compatible with probable UIP. No compelling progression of ILD since 05/15/2022 high-resolution chest CT. Mild progression of ILD since baseline 01/07/2020 high-resolution chest CT. Findings are categorized as probable UIP per consensus guidelines: Diagnosis of Idiopathic Pulmonary Fibrosis: An Official ATS/ERS/JRS/ALAT Clinical Practice Guideline. Am Rosezetta Schlatter Crit Care Med Vol 198, Iss 5, [phone], Sep 23 2016. 2. Two-vessel coronary atherosclerosis. 3.  Aortic Atherosclerosis (ICD10-I70.0).    ROS-see HPI   + = positive Constitutional:    weight loss, night sweats, fevers, chills, fatigue, lassitude. HEENT:    headaches, difficulty swallowing, tooth/dental problems, sore throat,       sneezing, itching, ear ache, nasal congestion, post nasal drip, snoring CV:    chest pain, orthopnea, PND, swelling in lower extremities, anasarca,                                   dizziness, palpitations Resp:   shortness of breath with exertion  or at rest.                productive cough,   non-productive cough, coughing up  of blood.              change in color of mucus.  wheezing.   Skin:    rash or lesions. GI:  No-   heartburn, indigestion, abdominal pain, nausea, vomiting, diarrhea,                 change in bowel habits, loss of appetite GU: dysuria, change in color of urine, no urgency or frequency.   flank pain. MS:   joint pain, stiffness, decreased range of motion, back pain. Neuro-    Peripheral neuropathy Psych:  change in mood or affect.  depression or anxiety.   memory loss.  OBJ- Physical Exam General- Alert, Oriented, Affect-appropriate, Distress- none acute, + slender Skin- rash-none, lesions- none, excoriation- none Lymphadenopathy- none Head- atraumatic            Eyes- Gross vision intact, PERRLA, conjunctivae and secretions clear            Ears- Hearing, canals-normal            Nose- Clear, no-Septal dev, mucus, polyps, erosion, perforation             Throat- Mallampati II , mucosa clear , drainage- none, tonsils- atrophic Neck- flexible , trachea midline, no stridor , thyroid nl, carotid no bruit Chest - symmetrical excursion , unlabored           Heart/CV- RRR , no murmur , no gallop  , no rub, nl s1 s2                           - JVD- none , edema- none, stasis changes- none, varices- none           Lung- clear to P&A-no crackles heard, wheeze- none, cough- none , dullness-none, rub- none           Chest wall-  Abd-  Br/ Gen/ Rectal- Not done, not indicated Extrem- cyanosis- none, clubbing, none, atrophy- none, strength- nl Neuro- grossly intact to observation

## 2023-04-23 ENCOUNTER — Other Ambulatory Visit: Payer: Self-pay | Admitting: Neurology

## 2023-04-24 ENCOUNTER — Ambulatory Visit: Payer: Medicare Other | Admitting: Internal Medicine

## 2023-04-24 ENCOUNTER — Other Ambulatory Visit: Payer: Self-pay | Admitting: Neurology

## 2023-04-24 NOTE — Telephone Encounter (Signed)
 Requested Prescriptions   Pending Prescriptions Disp Refills   pregabalin (LYRICA) 50 MG capsule [Pharmacy Med Name: PREGABALIN 50 MG CAPSULE] 90 capsule 2    Sig: TAKE 3 CAPSULES BY MOUTH AT BEDTIME   Last seen 03/14/22, next appt not scheduled. Pt will need to schedule appt to continue receiving this rx from Dr. Terrace Arabia. Refusing refill until pt schedules appt

## 2023-04-26 ENCOUNTER — Ambulatory Visit
Admission: RE | Admit: 2023-04-26 | Discharge: 2023-04-26 | Disposition: A | Discharge status: Home / Self Care | Source: Ambulatory Visit | Attending: Internal Medicine | Admitting: Internal Medicine

## 2023-04-26 ENCOUNTER — Telehealth: Payer: Self-pay | Admitting: Neurology

## 2023-04-26 DIAGNOSIS — Z1231 Encounter for screening mammogram for malignant neoplasm of breast: Secondary | ICD-10-CM

## 2023-04-26 NOTE — Telephone Encounter (Signed)
 Requested Prescriptions   Pending Prescriptions Disp Refills   pregabalin (LYRICA) 50 MG capsule 90 capsule 2    Sig: Take 3 capsules (150 mg total) by mouth at bedtime.   Last seen 03/14/22, next appt not scheduled. Refusing refill until pt makes appt. Routing to phone room to get on next available

## 2023-04-26 NOTE — Telephone Encounter (Signed)
 Pt is requesting a refill for pregabalin (LYRICA) 50 MG capsule.  [facility]arm[facility]: [facility] [phone]

## 2023-04-29 ENCOUNTER — Other Ambulatory Visit: Payer: Self-pay | Admitting: Neurology

## 2023-04-30 ENCOUNTER — Other Ambulatory Visit: Payer: Self-pay | Admitting: Neurology

## 2023-04-30 MED ORDER — PREGABALIN 50 MG PO CAPS: 150.0000 mg | ORAL_CAPSULE | Freq: Every day | ORAL | 2 refills | Status: DC

## 2023-04-30 NOTE — Telephone Encounter (Signed)
 Pt has scheduled her 1 yr f/u with Dr Terrace Arabia, she would now like to request a refill on her pregabalin (LYRICA) 50 MG capsule to [facility] [phone]

## 2023-04-30 NOTE — Addendum Note (Signed)
 Addended by: Eather Colas E on: 04/30/2023 12:58 PM   Modules accepted: Orders

## 2023-04-30 NOTE — Telephone Encounter (Signed)
   Dispensed Days Supply Quantity Provider Pharmacy  PREGABALIN 50 MG CAPSULE 03/27/2023 30 90 each Levert Feinstein, MD [facility] [phone] - G...  PREGABALIN 50 MG CAPSULE 02/16/2023 30 90 each Levert Feinstein, MD [facility] [phone] - G...  PREGABALIN 50 MG CAPSULE 01/01/2023 30 90 each Levert Feinstein, MD [facility] [phone] - G...  PREGABALIN 50 MG CAPSULE 11/14/2022 30 90 each Anson Fret, MD [facility] [phone] - G...  PREGABALIN 50 MG CAPSULE 09/28/2022 30 90 each Anson Fret, MD [facility] [phone] - G...  PREGABALIN 50 MG CAPSULE 08/15/2022 30 90 each Anson Fret, MD [facility] [phone] - G...  PREGABALIN 50 MG CAPSULE 07/10/2022 30 90 each Levert Feinstein, MD [facility] [phone] - G...  PREGABALIN 50 MG CAPSULE 06/09/2022 30 90 each Levert Feinstein, MD [facility] [phone] - G...      Last visit 2/202/2024 Next visit 07/12/2023  Addressed by another staff member

## 2023-05-01 ENCOUNTER — Other Ambulatory Visit: Payer: Self-pay | Admitting: Neurology

## 2023-05-01 NOTE — Telephone Encounter (Signed)
 Last seen 06/23/21, next appt 07/12/23 Dispenses   Dispensed Days Supply Quantity Provider Pharmacy  PREGABALIN 50 MG CAPSULE 03/27/2023 30 90 each Levert Feinstein, MD [facility] [phone] - G...  PREGABALIN 50 MG CAPSULE 02/16/2023 30 90 each Levert Feinstein, MD [facility] [phone] - G...  PREGABALIN 50 MG CAPSULE 01/01/2023 30 90 each Levert Feinstein, MD [facility] [phone] - G...  PREGABALIN 50 MG CAPSULE 11/14/2022 30 90 each Anson Fret, MD [facility] [phone] - G...  PREGABALIN 50 MG CAPSULE 09/28/2022 30 90 each Anson Fret, MD [facility] [phone] - G...  PREGABALIN 50 MG CAPSULE 08/15/2022 30 90 each Anson Fret, MD [facility] [phone] - G...  PREGABALIN 50 MG CAPSULE 07/10/2022 30 90 each Levert Feinstein, MD [facility] [phone] - G...  PREGABALIN 50 MG CAPSULE 06/09/2022 30 90 each Levert Feinstein, MD [facility] [phone] - G.Marland KitchenMarland Kitchen

## 2023-05-03 DIAGNOSIS — K080 Exfoliation of teeth due to systemic causes: Secondary | ICD-10-CM | POA: Diagnosis not present

## 2023-05-04 ENCOUNTER — Other Ambulatory Visit: Payer: Self-pay | Admitting: Neurology

## 2023-05-04 NOTE — Telephone Encounter (Signed)
 Requested Prescriptions   Pending Prescriptions Disp Refills   pregabalin (LYRICA) 50 MG capsule [Pharmacy Med Name: PREGABALIN 50 MG CAPSULE] 90 capsule     Sig: TAKE 3 CAPSULES BY MOUTH AT BEDTIME   Last seen 03/14/22, next appt 07/12/23 Dispenses   Dispensed Days Supply Quantity Provider Pharmacy  PREGABALIN 50 MG CAPSULE 03/27/2023 30 90 each Levert Feinstein, MD [facility] [phone] - G...  PREGABALIN 50 MG CAPSULE 02/16/2023 30 90 each Levert Feinstein, MD [facility] [phone] - G...  PREGABALIN 50 MG CAPSULE 01/01/2023 30 90 each Levert Feinstein, MD [facility] [phone] - G...  PREGABALIN 50 MG CAPSULE 11/14/2022 30 90 each Anson Fret, MD [facility] [phone] - G...  PREGABALIN 50 MG CAPSULE 09/28/2022 30 90 each Anson Fret, MD [facility] [phone] - G...  PREGABALIN 50 MG CAPSULE 08/15/2022 30 90 each Anson Fret, MD [facility] [phone] - G...  PREGABALIN 50 MG CAPSULE 07/10/2022 30 90 each Levert Feinstein, MD [facility] [phone] - G...  PREGABALIN 50 MG CAPSULE 06/09/2022 30 90 each Levert Feinstein, MD [facility] [phone] - G.Marland KitchenMarland Kitchen

## 2023-05-23 DIAGNOSIS — L821 Other seborrheic keratosis: Secondary | ICD-10-CM | POA: Diagnosis not present

## 2023-05-23 DIAGNOSIS — L814 Other melanin hyperpigmentation: Secondary | ICD-10-CM | POA: Diagnosis not present

## 2023-05-23 DIAGNOSIS — D225 Melanocytic nevi of trunk: Secondary | ICD-10-CM | POA: Diagnosis not present

## 2023-05-23 DIAGNOSIS — L853 Xerosis cutis: Secondary | ICD-10-CM | POA: Diagnosis not present

## 2023-05-23 DIAGNOSIS — K080 Exfoliation of teeth due to systemic causes: Secondary | ICD-10-CM | POA: Diagnosis not present

## 2023-06-28 DIAGNOSIS — K080 Exfoliation of teeth due to systemic causes: Secondary | ICD-10-CM | POA: Diagnosis not present

## 2023-07-04 DIAGNOSIS — M79671 Pain in right foot: Secondary | ICD-10-CM | POA: Diagnosis not present

## 2023-07-04 DIAGNOSIS — M47812 Spondylosis without myelopathy or radiculopathy, cervical region: Secondary | ICD-10-CM | POA: Diagnosis not present

## 2023-07-04 DIAGNOSIS — Z96612 Presence of left artificial shoulder joint: Secondary | ICD-10-CM | POA: Diagnosis not present

## 2023-07-12 ENCOUNTER — Ambulatory Visit: Admitting: Neurology

## 2023-07-12 ENCOUNTER — Encounter: Payer: Self-pay | Admitting: Neurology

## 2023-07-12 VITALS — BP 143/77 | HR 53 | Ht 62.0 in | Wt 107.0 lb

## 2023-07-12 DIAGNOSIS — G2581 Restless legs syndrome: Secondary | ICD-10-CM | POA: Diagnosis not present

## 2023-07-12 DIAGNOSIS — M79671 Pain in right foot: Secondary | ICD-10-CM | POA: Diagnosis not present

## 2023-07-12 MED ORDER — PREGABALIN 50 MG PO CAPS: 50.0000 mg | ORAL_CAPSULE | Freq: Three times a day (TID) | ORAL | 3 refills | Status: DC

## 2023-07-12 NOTE — Progress Notes (Signed)
 Chief Complaint  Patient presents with   Follow-up    Rm 15 alone  RLS, RIGHT FOOT PAIN 1 yr f/u:  Pt is well and stable, reports Lyrica  does help with RLS. No new concerns.       ASSESSMENT AND PLAN  Nicole [name] is a 83 y.o. female   Restless leg symptoms Right foot pain  In the setting of worsening right bunion,    Likely combination of musculoskeletal etiology,  Previously tried gabapentin , Cymbalta , is a limited help,   Lyrica  to 50 mg  3 tablets every night only,  Diclofenac  gel mix with Emla  gel, warm compression, massage for right foot pain,    Will continue Lyrica  refill by PCP, only return for new issues  DIAGNOSTIC DATA (LABS, IMAGING, TESTING) - I reviewed patient records, labs, notes, testing and imaging myself where available.   MEDICAL HISTORY:  Nicole [name] is a 83 year old female, previously patient of Dr. Tilda Fogo, return to evaluation for bilateral lower extremity paresthesia, her primary care physician is Dr. Manfred Seed, Lewie Rector,    I reviewed and summarized the referring note.  Past medical history Prediabetes, was under good control with weight loss and tired,  She was seen by Dr. Tilda Fogo since 2019 for evaluation of few years history of gradual onset numbness in her feet, she describes discomfort when bearing weight, but most bothersome symptoms is at night when she tried to go to sleep, she has difficulty finding a comfortable position, bilateral lower extremity numbness tingling deep achy sensation, she was given gabapentin  up to 300 mg twice a day without much benefit  EMG nerve conduction study by Dr. 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, requires standing on her feet for prolonged period of time, she described bilateral foot  burning sensation after prolonged standing, she denies difficulty walking, walk around the park regularly about 1 mile without any difficulty, she denies significant low back pain, no bilateral lower extremity paresthesia  She does have bladder prolapse, complains of bladder frequency, urgency,  Nighttime she complains of difficulty falling to sleep because bilateral lower extremity achy numbing sensation, but did not have the urge to pace around, she has tried Capzasin and lidocaine  cream with limited help   Evaluation in March 2022 showed normal negative ANA, ESR, and disco derma antibody, Sjogren antibody, ANCA, rheumatoid factor, Vit D.  UPDATE Mar 14 2022: She was given Lyrica  since June 2023, initially it was helping, now less obvious, she is taking 50 mg 1-2 times a day, 1 in the morning, the other at nighttime  She works part-time job at [employer], aft standing for few hours, she felt right foot pain, centered around her right first and second toes, ball area, at nighttime, she has the urge to move her leg, difficulty falling to sleep,  She complains of mild low back pain, denies bowel or bladder incontinence  She had a history of left bunion surgery, right bunion gradually getting worse, also has a thick callus at the base of the right second toe,  UPDATE  July 12 2023: She is doing well taking Lyrica  50 mg 3 times at night, is going to see a podiatrist for her bunion,  PHYSICAL EXAM:   Vitals:   07/12/23 0946  BP: (!) 143/77  Pulse: (!) 53  Weight: 107 lb (48.5 kg)  Height: 5' 2 (1.575 m)    Body mass index is 19.57 kg/m.  PHYSICAL EXAMNIATION:  Gen: NAD, conversant, well nourised,  well groomed       MENTAL STATUS: Speech/cognition: Awake, alert, oriented to history taking and casual conversation CRANIAL NERVES: CN II: Visual fields are full to confrontation. Pupils are round equal and briskly reactive to light. CN III, IV, VI: extraocular movement are normal. No ptosis. CN V: Facial sensation is intact to light touch CN VII: Face is symmetric with normal eye closure  CN VIII: Hearing is normal to causal conversation. CN IX, X: Phonation is normal. CN XI: Head turning and shoulder shrug are intact  MOTOR: There is no pronator drift of out-stretched arms. Muscle bulk and tone are normal. Muscle strength is normal.    REFLEXES: Reflexes are 1 and symmetric at the biceps, triceps, 2/2knees, and ankles. Plantar responses are flexor.  SENSORY: Decreased vibratory sensation at toes, preserved to pinprick and light touch.  COORDINATION: There is no trunk or limb dysmetria noted.  GAIT/STANCE:  needs push-up to get up from seated position, steady  REVIEW OF SYSTEMS:  Full 14 system review of systems performed and notable only for as above All other review of systems were negative.   ALLERGIES: Allergies  Allergen Reactions   Keppra  [Levetiracetam ]     Depression   Other     She is allergic to an antibiotic for uti that begins with M   Rosuvastatin Calcium  Diarrhea    Muscle aches   Statins Other (See Comments)    Muscle aches   Lamictal  [Lamotrigine ] Rash    HOME MEDICATIONS: Current Outpatient Medications  Medication Sig Dispense Refill   amLODipine  (NORVASC ) 5 MG tablet Take 1 tablet (5 mg total) by mouth daily. 90 tablet 3   Calcium  Carbonate-Vitamin D (CALCIUM -VITAMIN D3 PO) Take 15 mLs by mouth 2 (two) times daily.     CINNAMON PO Take 1 capsule by mouth 2 (two) times daily.     clonazePAM  (KLONOPIN ) 1 MG tablet Take 0.5 mg by mouth 2 (two) times daily as needed for anxiety.     denosumab  (PROLIA ) 60 MG/ML SOSY injection Inject 60 mg into the  skin every 6 (six) months.     diclofenac  Sodium (VOLTAREN ) 1 % GEL APPLY 2 GRAMS 4 TIMES A DAY AS NEEDED 100 g 3   Evolocumab  (REPATHA  SURECLICK) 140 MG/ML SOAJ Inject 140 mg into the skin every 14 (fourteen) days.     Evolocumab  (REPATHA  SURECLICK) 140 MG/ML SOAJ INJECT 1 DOSE INTO THE SKIN EVERY 14 DAYS 2 mL 6   HYDROcodone -acetaminophen  (NORCO/VICODIN) 5-325 MG tablet Take 1 tablet by mouth every 6 (six) hours as needed for up to 15 doses for severe pain. 15 tablet 0   levothyroxine  (SYNTHROID ) 88 MCG tablet Take 88 mcg by mouth daily.     lidocaine -prilocaine  (EMLA ) cream 1 gram qid prn 30 g 11   Multiple Vitamin (MULTIVITAMIN WITH MINERALS) TABS tablet Take 1 tablet by mouth daily.     olmesartan (BENICAR) 40 MG tablet olmesartan 40 mg tablet  TAKE 1 TABLET BY MOUTH EVERY DAY     pregabalin  (LYRICA ) 50 MG capsule TAKE 3 CAPSULES BY MOUTH  AT BEDTIME 90 capsule 3   No current facility-administered medications for this visit.    PAST MEDICAL HISTORY: Past Medical History:  Diagnosis Date   Anxiety    Arthritis    Cancer (HCC)    Basal cell   GERD (gastroesophageal reflux disease)    HTN (hypertension)    Hyperlipemia    Hypothyroidism    Increased liver enzymes    PONV (postoperative nausea and vomiting)     PAST SURGICAL HISTORY: Past Surgical History:  Procedure Laterality Date   BREAST BIOPSY Left 12/28/2010   BREAST EXCISIONAL BIOPSY Left 12/28/2010   BREAST SURGERY Bilateral    reduction over 20 years   CARDIOVASCULAR STRESS TEST  05/1998   breast attenuation in anterior septal wall, EF 63%   CHOLECYSTECTOMY     FOOT SURGERY     HIP SURGERY     HYSTEROSCOPY     REDUCTION MAMMAPLASTY Bilateral    over 20 years ago   REVERSE SHOULDER ARTHROPLASTY Left 05/12/2016   Procedure: LEFT REVERSE TOTAL SHOULDER ARTHROPLASTY;  Surgeon: Winston Hawking, MD;  Location: [facility] OR;  Service: Orthopedics;  Laterality: Left;    FAMILY HISTORY: Family History  Problem Relation Age of  Onset   Depression Mother        stroke   Stroke Father    Drug abuse Daughter    Depression Daughter    Heart attack Brother 28   Breast cancer Neg Hx     SOCIAL HISTORY: Social History   Socioeconomic History   Marital status: Widowed    Spouse name: Not on file   Number of children: 2   Years of education: 16   Highest education level: Not on file  Occupational History   Occupation: Investment banker, corporate: [employer]  Tobacco Use   Smoking status: Never   Smokeless tobacco: Never  Vaping Use   Vaping status: Never Used  Substance and Sexual Activity   Alcohol  use: Yes    Alcohol /week: 2.0 standard drinks of alcohol     Types: 2 Glasses of wine per week    Comment: occasionally   Drug use: No   Sexual activity: Not Currently  Other Topics Concern   Not on file  Social History Narrative   Lives at home with husband   Right handed    1 cup of Caffeine daily     Social Drivers of Corporate investment banker Strain: Not on file  Food Insecurity: Not on file  Transportation Needs: Not on file  Physical Activity: Not on file  Stress: Not on file  Social Connections: Not on file  Intimate Partner Violence: Not on file      Phebe Brasil, M.D. Ph.D.  [facility] [address] [place], [place] [address] Ph: [phone] Fax: [phone]  CC:  Elester Grim, MD [address] [place],  [place] [address]  Elester Grim, MD

## 2023-07-16 NOTE — Progress Notes (Deleted)
 HPI F never smoker,followed for lung Nodules, Bronchiectasis, ILD/UIP  complicated by  HTN, GERD, Hepatic Steatosis, Hypothyroid, PONV, Arthritis, Hyperlipidemia, CAD, Aortic Atherosclerosis,   (widow of my patient Nicole [name] who died with complications of thymic cancer, esophageal cancer, aspiration pneumonia, chronic lung disease.) Lab- Quatiferon TB Gold 11/27/19- Negative HRCT chest 01/07/20-  considered probable usual interstitial pneumonia (UIP) mucoid impaction in the lingula accounting for the micro and macronodularity in this region. 2. Aortic atherosclerosis, in addition to two vessel coronary artery disease. HST 04/24/19- AHI 1.2/ hr PFT 04/12/20- minimal obstruction(possible), no response to BD, Nl lung volumes, minimal DLCO deficit UIP w/u labs 3/29- negative. =========================================================    08/28/22- 81 yoF never smoker,followed for lung Nodules, Bronchiectasis, ILD/UIP  complicated by  HTN, GERD, Hepatic Steatosis, Hypothyroid, PONV, Arthritis, Hyperlipidemia, CAD, Aortic Atherosclerosis,   (widow of my patient Nicole [name] who died with complications of thymic cancer, esophageal cancer, aspiration pneumonia, chronic lung disease.) -clonazepam  1 mg, doxepin 50, Norco, temazepam  15 She has wanted to avoid intervention for ILD. She reports feeling well- no productive cough.  Bothersome postnasal drip. Suggested trial of claritin. Discussed most recent CT, which indicates nodule appears smaller. We will continue to follow. She asks comparison to husband, who had a nodular pneumonia. She is not having fever/ sweats. CTchest 08/01/22 IMPRESSION: Previously seen nodule along the left posterior hemidiaphragm difficult to visualize on today's study, and appears smaller measuring 4 mm compared to 7 mm previously. No follow-up needed if patient is low-risk.This recommendation follows the consensus statement: Guidelines for Management of Incidental Pulmonary  Nodules Detected on CT Images: From the Fleischner Society 2017; Radiology 2017; 284:228-243  Stable mucoid impaction and nodular densities in the lingula compatible with chronic/indolent infection such as MAI. Scattered coronary artery disease. Aortic Atherosclerosis (ICD10-I70.0).  07/17/23- 82 yoF never smoker,followed for lung Nodules, Bronchiectasis, ILD/UIP  complicated by  HTN, GERD, Hepatic Steatosis, Hypothyroid, PONV, Arthritis, Hyperlipidemia, CAD, Aortic Atherosclerosis,   (widow of my patient Yasmen Cortner who died with complications of thymic cancer, esophageal cancer, aspiration pneumonia, chronic lung disease.) -clonazepam  1 mg, doxepin 50, Norco, temazepam  15 She has wanted to avoid intervention for ILD.  HRCT chest 02/16/23 IMPRESSION: 1. Pulmonary parenchymal findings favor chronic atypical mycobacterial infection (MAI) in the lingula superimposed on pulmonary parenchymal findings of mild basilar predominant fibrotic interstitial lung disease compatible with probable UIP. No compelling progression of ILD since 05/15/2022 high-resolution chest CT. Mild progression of ILD since baseline 01/07/2020 high-resolution chest CT. Findings are categorized as probable UIP per consensus guidelines: Diagnosis of Idiopathic Pulmonary Fibrosis: An Official ATS/ERS/JRS/ALAT Clinical Practice Guideline. Am JINNY Honey Crit Care Med Vol 198, Iss 5, [phone], Sep 23 2016. 2. Two-vessel coronary atherosclerosis. 3.  Aortic Atherosclerosis (ICD10-I70.0).      ROS-see HPI   + = positive Constitutional:    weight loss, night sweats, fevers, chills, fatigue, lassitude. HEENT:    headaches, difficulty swallowing, tooth/dental problems, sore throat,       sneezing, itching, ear ache, nasal congestion, post nasal drip, snoring CV:    chest pain, orthopnea, PND, swelling in lower extremities, anasarca,                                   dizziness, palpitations Resp:   shortness of breath with  exertion or at rest.                productive cough,  non-productive cough, coughing up of blood.              change in color of mucus.  wheezing.   Skin:    rash or lesions. GI:  No-   heartburn, indigestion, abdominal pain, nausea, vomiting, diarrhea,                 change in bowel habits, loss of appetite GU: dysuria, change in color of urine, no urgency or frequency.   flank pain. MS:   joint pain, stiffness, decreased range of motion, back pain. Neuro-    Peripheral neuropathy Psych:  change in mood or affect.  depression or anxiety.   memory loss.  OBJ- Physical Exam General- Alert, Oriented, Affect-appropriate, Distress- none acute, + slender Skin- rash-none, lesions- none, excoriation- none Lymphadenopathy- none Head- atraumatic            Eyes- Gross vision intact, PERRLA, conjunctivae and secretions clear            Ears- Hearing, canals-normal            Nose- Clear, no-Septal dev, mucus, polyps, erosion, perforation             Throat- Mallampati II , mucosa clear , drainage- none, tonsils- atrophic Neck- flexible , trachea midline, no stridor , thyroid  nl, carotid no bruit Chest - symmetrical excursion , unlabored           Heart/CV- RRR , no murmur , no gallop  , no rub, nl s1 s2                           - JVD- none , edema- none, stasis changes- none, varices- none           Lung- clear to P&A-no crackles heard, wheeze- none, cough- none , dullness-none, rub- none           Chest wall-  Abd-  Br/ Gen/ Rectal- Not done, not indicated Extrem- cyanosis- none, clubbing, none, atrophy- none, strength- nl Neuro- grossly intact to observation

## 2023-07-17 ENCOUNTER — Ambulatory Visit: Admitting: Internal Medicine

## 2023-07-19 ENCOUNTER — Encounter: Payer: Self-pay | Admitting: Obstetrics and Gynecology

## 2023-07-19 ENCOUNTER — Ambulatory Visit: Admitting: Obstetrics and Gynecology

## 2023-07-19 VITALS — BP 168/87 | HR 46

## 2023-07-19 DIAGNOSIS — N812 Incomplete uterovaginal prolapse: Secondary | ICD-10-CM | POA: Diagnosis not present

## 2023-07-19 DIAGNOSIS — L309 Dermatitis, unspecified: Secondary | ICD-10-CM | POA: Diagnosis not present

## 2023-07-19 DIAGNOSIS — L0101 Non-bullous impetigo: Secondary | ICD-10-CM | POA: Diagnosis not present

## 2023-07-19 NOTE — Patient Instructions (Addendum)
 For Constipation: Take a small (1/2 cap) dose of miralax  daily to help with bowel movements. You can take this in addition to the metamucil.   Plan for surgery : Anterior and posterior repair, sacrospinous ligament fixation, cystoscopy

## 2023-07-19 NOTE — Progress Notes (Signed)
 [facility] Return Visit  SUBJECTIVE  History of Present Illness: Nicole [name] is a 83 y.o. female seen in follow-up for prolapse. She is interested in surgery now.   Feeling more prolapse, especially with standings. Having more pelvic pressure.  No trouble emptying her bladder, but sometimes has some hesitancy.   Bowel movements are irregular She is using metamucil daily.   She had follow up cysto for hematuria with Dr Jacqlyn in March  Had urodynamic testing (07/25/21) at [facility]:  - maximum bladder capacity - bladder was stable, no DO - No SUI demonstrated but low pressures generated- performed with and without prolapse reduction.  - Max voiding pressure 57cm H20, residual , prolapse was reduced for voiding. Normal EMG activity.   Past Medical History: Patient  has a past medical history of Anxiety, Arthritis, Cancer (HCC), GERD (gastroesophageal reflux disease), HTN (hypertension), Hyperlipemia, Hypothyroidism, Increased liver enzymes, and PONV (postoperative nausea and vomiting).   Past Surgical History: She  has a past surgical history that includes Cholecystectomy; Foot surgery; Hip surgery; Cardiovascular stress test (05/1998); Breast surgery (Bilateral); Reverse shoulder arthroplasty (Left, 05/12/2016); Reduction mammaplasty (Bilateral); Breast biopsy (Left, 12/28/2010); Breast excisional biopsy (Left, 12/28/2010); and Hysteroscopy.   Medications: She has a current medication list which includes the following prescription(s): amlodipine , calcium  carb-cholecalciferol, cinnamon, clonazepam , denosumab , diclofenac  sodium, repatha  sureclick, repatha  sureclick, hydrocodone -acetaminophen , levothyroxine , lidocaine -prilocaine , multivitamin with minerals, olmesartan, and pregabalin .   Allergies: Patient is allergic to keppra  [levetiracetam ], other, rosuvastatin calcium , statins, and lamictal  [lamotrigine ].   Social History: Patient  reports that  she has never smoked. She has never used smokeless tobacco. She reports current alcohol  use of about 2.0 standard drinks of alcohol  per week. She reports that she does not use drugs.     OBJECTIVE     Physical Exam: Vitals:   07/19/23 0927  BP: (!) 168/87  Pulse: (!) 46   Gen: No apparent distress, A&O x 3.  Detailed Urogynecologic Evaluation:  Normal external genitalia. On speculum, normal vaginal mucosa and normal appearing cervix. On bimanual, uterus is small, mobile and nontender.   Prior exam showed:  POP-Q  2                                            Aa   2                                           Ba  1                                              C   3.5                                            Gh  3.5                                            Pb  6  tvl   -2                                            Ap  -2                                            Bp  -3.5                                              D      ASSESSMENT AND PLAN    Nicole [name] is a 83 y.o. with:  1. Uterovaginal prolapse, incomplete     - We discussed repeating urodynamic testing but she has not had any new urinary symptoms and she prefers not to go through testing again. We discussed that she may develop incontinence after prolapse repair, but we can always plan for a procedure at a later date if needed.   Plan for surgery: Exam under anesthesia, anterior and posterior repair, sacrospinous hysteropexy, cystoscopy  - We reviewed the patient's specific anatomic and functional findings, with the assistance of diagrams, and together finalized the above procedure. The planned surgical procedures were discussed along with the surgical risks outlined below, which were also provided on a detailed handout. Additional treatment options including expectant management, conservative management, medical management were discussed where appropriate.  We  reviewed the benefits and risks of each treatment option.  - Prolapse had advanced some since she was last seen in Dec 2023. She still prefers a less invasive prolapse repair without hysterectomy.   General Surgical Risks: For all procedures, there are risks of bleeding, infection, damage to surrounding organs including but not limited to bowel, bladder, blood vessels, ureters and nerves, and need for further surgery if an injury were to occur. These risks are all low with minimally invasive surgery.   There are risks of numbness and weakness at any body site or buttock/rectal pain.  It is possible that baseline pain can be worsened by surgery, either with or without mesh. If surgery is vaginal, there is also a low risk of possible conversion to laparoscopy or open abdominal incision where indicated. Very rare risks include blood transfusion, blood clot, heart attack, pneumonia, or death.   There is also a risk of short-term postoperative urinary retention with need to use a catheter. About half of patients need to go home from surgery with a catheter, which is then later removed in the office. The risk of long-term need for a catheter is very low. There is also a risk of worsening of overactive bladder.   Prolapse (with or without mesh): Risk factors for surgical failure  include things that put pressure on your pelvis and the surgical repair, including obesity, chronic cough, and heavy lifting or straining (including lifting children or adults, straining on the toilet, or lifting heavy objects such as furniture or anything weighing >25 lbs. Risks of recurrence is 20-30% with vaginal native tissue repair and a less than 10% with sacrocolpopexy with mesh.     - For preop Visit:  She is required to have a visit within 30 days of her surgery.   -  Medical clearance: required from Pulmonology. Letter sent to Dr Reggy Salt requesting risk stratification and medical optimization. - Anticoagulant use:  No - Medicaid Hysterectomy form: n/a - Accepts blood transfusion: Yes - Expected length of stay: outpatient  Request sent for surgery scheduling.   Rosaline LOISE Caper, MD

## 2023-07-20 ENCOUNTER — Telehealth: Payer: Self-pay | Admitting: Internal Medicine

## 2023-07-20 ENCOUNTER — Telehealth: Payer: Self-pay | Admitting: Pharmacy Technician

## 2023-07-20 ENCOUNTER — Other Ambulatory Visit (HOSPITAL_COMMUNITY): Payer: Self-pay

## 2023-07-20 NOTE — Telephone Encounter (Signed)
 Pharmacy Patient Advocate Encounter  Received notification from [facility] that Prior Authorization for repatha  140mg  has been APPROVED from 07/20/23 to 07/19/24 Sent patient a mychart   PA #/Case ID/Reference #: [id number]

## 2023-07-20 NOTE — Telephone Encounter (Signed)
 Fax received from Dr. Rosaline Caper with [facility] to perform a surgery for her pelvin organ prolapse on patient. Estimated 1 1/2 hour procedure under general  Patient needs surgery clearance. Surgery is pending. Patient was seen on 08/28/22. Office protocol is a risk assessment can be sent to surgeon if patient has been seen in 60 days or less.   She has appt pending on 08/30/23 with Dr Oretha to appt notes that risk assessment is needed. Routing to clearance pool until the visit has been completed.

## 2023-07-20 NOTE — Telephone Encounter (Signed)
 Pharmacy Patient Advocate Encounter   Received notification from Fax that prior authorization for Repatha  is required/requested.   Insurance verification completed.   The patient is insured through [employer] .   Per test claim: PA required; PA submitted to above mentioned insurance via CoverMyMeds Key/confirmation #/EOC [facility] Status is pending

## 2023-07-24 DIAGNOSIS — M2041 Other hammer toe(s) (acquired), right foot: Secondary | ICD-10-CM | POA: Diagnosis not present

## 2023-07-30 DIAGNOSIS — E039 Hypothyroidism, unspecified: Secondary | ICD-10-CM | POA: Diagnosis not present

## 2023-07-30 DIAGNOSIS — I129 Hypertensive chronic kidney disease with stage 1 through stage 4 chronic kidney disease, or unspecified chronic kidney disease: Secondary | ICD-10-CM | POA: Diagnosis not present

## 2023-07-30 DIAGNOSIS — M810 Age-related osteoporosis without current pathological fracture: Secondary | ICD-10-CM | POA: Diagnosis not present

## 2023-07-30 DIAGNOSIS — E1121 Type 2 diabetes mellitus with diabetic nephropathy: Secondary | ICD-10-CM | POA: Diagnosis not present

## 2023-08-01 ENCOUNTER — Telehealth: Payer: Self-pay

## 2023-08-01 NOTE — Telephone Encounter (Addendum)
 Copied from CRM [id number]. Topic: Appointments - Scheduling Inquiry for Clinic >> Jul 31, 2023  4:06 PM Isabell A wrote: Reason for CRM: Patient wants to confirm Dr.Young received a letter from Dr.Schroeder in regard to her outpatient procedure for her bladder - she is requesting a sooner appointment for pre-op clearance.   Callback number: [id number] >> Jul 31, 2023  4:15 PM Thersia RAMAN wrote: No appointments available until September, can I add patient to blocked slot? Also please advise if Dr.Young got the letter, thanks!      Per Dr. Neysa, yes, you can use a blocked (RNA) slot.  And yes, the letter was in [facility] and I printed the letter out for Dr. Neysa and gave this to him for his review (per his request).  You can call and schedule patient for her appointment.  Thank you!

## 2023-08-02 NOTE — Telephone Encounter (Signed)
 ATC patient, she answered and then hung up, I called back and no answer so I left vm.

## 2023-08-03 ENCOUNTER — Other Ambulatory Visit: Payer: Self-pay | Admitting: Internal Medicine

## 2023-08-03 ENCOUNTER — Encounter: Payer: Self-pay | Admitting: Internal Medicine

## 2023-08-03 DIAGNOSIS — A4902 Methicillin resistant Staphylococcus aureus infection, unspecified site: Secondary | ICD-10-CM | POA: Diagnosis not present

## 2023-08-03 NOTE — Telephone Encounter (Signed)
 Patient called wanting to schedule pre-op appointment.   Please follow up

## 2023-08-06 NOTE — Telephone Encounter (Signed)
 Patient is now scheduled 7/25 at 11:30 w/ Dr.Young. NFN.

## 2023-08-10 DIAGNOSIS — M8588 Other specified disorders of bone density and structure, other site: Secondary | ICD-10-CM | POA: Diagnosis not present

## 2023-08-10 DIAGNOSIS — E2839 Other primary ovarian failure: Secondary | ICD-10-CM | POA: Diagnosis not present

## 2023-08-13 NOTE — Telephone Encounter (Signed)
 Pt scheduled for 08/17/23 with CY

## 2023-08-15 NOTE — Telephone Encounter (Signed)
 Thank  you. Sotero CMA

## 2023-08-16 NOTE — Progress Notes (Signed)
 HPI Nicole [name] never smoker,followed for lung Nodules, Bronchiectasis, ILD/UIP  complicated by  HTN, GERD, Hepatic Steatosis, Hypothyroid, PONV, Arthritis, Hyperlipidemia, CAD, Aortic Atherosclerosis,   (widow of my patient Nicole [name] who died with complications of thymic cancer, esophageal cancer, aspiration pneumonia, chronic lung disease.) Lab- [facility] TB Gold 11/27/19- Negative HRCT chest 01/07/20-  considered probable usual interstitial pneumonia (UIP) mucoid impaction in the lingula accounting for the micro and macronodularity in this region. 2. Aortic atherosclerosis, in addition to two vessel coronary artery disease. HST 04/24/19- AHI 1.2/ hr PFT 04/12/20- minimal obstruction(possible), no response to BD, Nl lung volumes, minimal DLCO deficit UIP w/u labs 3/29- negative. =========================================================   08/28/22- 81 yoF never smoker,followed for lung Nodules, Bronchiectasis, ILD/UIP  complicated by  HTN, GERD, Hepatic Steatosis, Hypothyroid, PONV, Arthritis, Hyperlipidemia, CAD, Aortic Atherosclerosis,   (widow of my patient Nicole [name] who died with complications of thymic cancer, esophageal cancer, aspiration pneumonia, chronic lung disease.) -clonazepam  1 mg, doxepin 50, Norco, temazepam  15 She has wanted to avoid intervention for ILD. She reports feeling well- no productive cough.  Bothersome postnasal drip. Suggested trial of claritin. Discussed most recent CT, which indicates nodule appears smaller. We will continue to follow. She asks comparison to husband, who had a nodular pneumonia. She is not having fever/ sweats. CTchest 08/01/22 IMPRESSION: Previously seen nodule along the left posterior hemidiaphragm difficult to visualize on today's study, and appears smaller measuring 4 mm compared to 7 mm previously. No follow-up needed if patient is low-risk.This recommendation follows the consensus statement: Guidelines for Management of Incidental Pulmonary  Nodules Detected on CT Images: From the Fleischner Society 2017; Radiology 2017; 284:228-243  Stable mucoid impaction and nodular densities in the lingula compatible with chronic/indolent infection such as MAI. Scattered coronary artery disease. Aortic Atherosclerosis (ICD10-I70.0).  08/17/23- 82yoF 81 yoF never smoker,followed for lung Nodules, Bronchiectasis, ILD/UIP  complicated by  HTN, GERD, Hepatic Steatosis, Hypothyroid, PONV, Arthritis, Hyperlipidemia, CAD, Aortic Atherosclerosis,   (widow of my patient Nicole [name] who died with complications of thymic cancer, esophageal cancer, aspiration pneumonia, chronic lung disease.) -clonazepam  1 mg, doxepin 50, Norco, temazepam  15 She has wanted to avoid intervention for ILD. Now needs preop risk assessment for GOT hysterectomy-- prolapse ------Breathing is good.  No problems noted.   Discussed the use of AI scribe software for clinical note transcription with the patient, who gave verbal consent to proceed.  History of Present Illness   Nicole [name] is an 83 year old female with interstitial lung disease who presents for preoperative clearance. She was referred by Dr. Marilynne for preoperative clearance.  Her breathing is stable with no acute symptoms like fever or productive cough. Recent pulmonary function tests and CT scan results have been reviewed. A recent CT scan showed mild inflammation in the lingula of the left lung. She is asks switching from annual CT scans to annual chest x-rays due to cost concerns, since she has chosen not to have antifibrotic therapy for her ILD.SABRA  She has interstitial lung disease with no significant progression in the last year. She has chosen not to take medication for ILD due to side effects experienced by others.     Assessment and Plan:    Interstitial lung disease Interstitial lung disease consistent with usual interstitial pneumonia. Progression noted since 2021, stable in the last  year. Declined treatment due to medication side effects. Monitoring preferred. - Schedule follow-up in six months with a chest x-ray. - Recommend follow-up with Dr. Geronimo or Dr. Valorie  for ongoing management post-retirement.  Mycobacterium avium complex infection- suspected Mild inflammation in the lingula likely due to Mycobacterium avium complex infection. Slow-growing, non-contagious, no significant progression. Monitoring preferred over treatment due to potential side effects of the prolonged antibiotic regimen usually required.. - Monitor with annual chest x-ray instead of CT scans.  Preoperative evaluation- clear to proceed as planned from Pulmonary standpoint. No pulmonary contraindications for surgery on the 18th. Shared recent pulmonary function test and CT scan results with surgical team. - Ensure surgical team, including anesthesiologist, has access to recent pulmonary function test and CT scan results.    History of Present Illness  Pulmonary Function Test 04/12/20- : Minimal Obstructive Airways Disease- possible Insignificant response to bronchodilator Normal lung volumes Minimal Diffusion Defect  HRCT chest 02/16/23 MPRESSION: 1. Pulmonary parenchymal findings favor chronic atypical mycobacterial infection (MAI) in the lingula superimposed on pulmonary parenchymal findings of mild basilar predominant fibrotic interstitial lung disease compatible with probable UIP. No compelling progression of ILD since 05/15/2022 high-resolution chest CT. Mild progression of ILD since baseline 01/07/2020 high-resolution chest CT. Findings are categorized as probable UIP per consensus guidelines: Diagnosis of Idiopathic Pulmonary Fibrosis: An Official ATS/ERS/JRS/ALAT Clinical Practice Guideline. Am Nicole [name] Vol 198, Iss 5, [phone], Sep 23 2016. 2. Two-vessel coronary atherosclerosis. 3.  Aortic Atherosclerosis (ICD10-I70.0).     ROS-see HPI   + =  positive Constitutional:    weight loss, night sweats, fevers, chills, fatigue, lassitude. HEENT:    headaches, difficulty swallowing, tooth/dental problems, sore throat,       sneezing, itching, ear ache, nasal congestion, post nasal drip, snoring CV:    chest pain, orthopnea, PND, swelling in lower extremities, anasarca,                                   dizziness, palpitations Resp:   shortness of breath with exertion or at rest.                productive cough,   non-productive cough, coughing up of blood.              change in color of mucus.  wheezing.   Skin:    rash or lesions. GI:  No-   heartburn, indigestion, abdominal pain, nausea, vomiting, diarrhea,                 change in bowel habits, loss of appetite GU: dysuria, change in color of urine, no urgency or frequency.   flank pain. MS:   joint pain, stiffness, decreased range of motion, back pain. Neuro-    Peripheral neuropathy Psych:  change in mood or affect.  depression or anxiety.   memory loss.  OBJ- Physical Exam General- Alert, Oriented, Affect-appropriate, Distress- none acute, + slender Skin- rash-none, lesions- none, excoriation- none Lymphadenopathy- none Head- atraumatic            Eyes- Gross vision intact, PERRLA, conjunctivae and secretions clear            Ears- Hearing, canals-normal            Nose- Clear, no-Septal dev, mucus, polyps, erosion, perforation             Throat- Mallampati II , mucosa clear , drainage- none, tonsils- atrophic Neck- flexible , trachea midline, no stridor , thyroid  nl, carotid no bruit Chest - symmetrical excursion , unlabored  Heart/CV- RRR , no murmur , no gallop  , no rub, nl s1 s2                           - JVD- none , edema- none, stasis changes- none, varices- none           Lung- clear to P&A-no crackles heard, wheeze- none, cough- none , dullness-none, rub- none           Chest wall-  Abd-  Br/ Gen/ Rectal- Not done, not indicated Extrem- cyanosis-  none, clubbing, none, atrophy- none, strength- nl Neuro- grossly intact to observation

## 2023-08-17 ENCOUNTER — Ambulatory Visit: Admitting: Internal Medicine

## 2023-08-17 ENCOUNTER — Encounter: Payer: Self-pay | Admitting: Internal Medicine

## 2023-08-17 VITALS — BP 132/84 | HR 58 | Temp 98.2°F | Ht 61.5 in | Wt 106.0 lb

## 2023-08-17 DIAGNOSIS — J849 Interstitial pulmonary disease, unspecified: Secondary | ICD-10-CM | POA: Diagnosis not present

## 2023-08-17 NOTE — Patient Instructions (Signed)
 Order- schedule CXR in 6 months   dx ILD  You are ok from a pulmonary standpoint to go forward with your planned surgery next month. If anything changes or you have new concerns please let me know.

## 2023-08-20 ENCOUNTER — Encounter: Payer: Self-pay | Admitting: Internal Medicine

## 2023-08-20 NOTE — Telephone Encounter (Signed)
 Copy of 08/17/23 note with Dr Neysa faxed to [facility]

## 2023-08-29 DIAGNOSIS — M810 Age-related osteoporosis without current pathological fracture: Secondary | ICD-10-CM | POA: Diagnosis not present

## 2023-08-30 ENCOUNTER — Ambulatory Visit: Admitting: Internal Medicine

## 2023-09-03 ENCOUNTER — Ambulatory Visit (INDEPENDENT_AMBULATORY_CARE_PROVIDER_SITE_OTHER): Admitting: Obstetrics and Gynecology

## 2023-09-03 ENCOUNTER — Other Ambulatory Visit: Payer: Self-pay | Admitting: Obstetrics and Gynecology

## 2023-09-03 VITALS — BP 147/82 | HR 45

## 2023-09-03 DIAGNOSIS — Z01818 Encounter for other preprocedural examination: Secondary | ICD-10-CM

## 2023-09-03 MED ORDER — POLYETHYLENE GLYCOL 3350 17 GM/SCOOP PO POWD: 17.0000 g | Freq: Every day | ORAL | 0 refills | Status: AC

## 2023-09-03 MED ORDER — IBUPROFEN 600 MG PO TABS: 600.0000 mg | ORAL_TABLET | Freq: Four times a day (QID) | ORAL | 0 refills | Status: DC | PRN

## 2023-09-03 MED ORDER — ACETAMINOPHEN 500 MG PO TABS: 500.0000 mg | ORAL_TABLET | Freq: Four times a day (QID) | ORAL | 0 refills | Status: AC | PRN

## 2023-09-03 MED ORDER — OXYCODONE HCL 5 MG PO TABS: 5.0000 mg | ORAL_TABLET | ORAL | 0 refills | Status: AC | PRN

## 2023-09-03 NOTE — Progress Notes (Signed)
 [facility] Pre-Operative Exam  Subjective Chief Complaint: 83 y.o. presents for a preoperative encounter.   History of Present Illness: Nicole [name] is a 83 y.o. female who presents for preoperative visit.  She is scheduled to undergo Exam under anesthesia, anterior and posterior repair, sacrospinous hysteropexy, cystoscopy  on 09/10/23.  Her symptoms include Pelvic organ prolapse, and she was was found to have Stage III anterior, Stage I posterior, Stage II apical prolapse.   Urodynamics showed: Deferred  Past Medical History:  Diagnosis Date   Anxiety    Arthritis    Bronchiectasis (HCC)    Cancer (HCC)    Basal cell   GERD (gastroesophageal reflux disease)    HTN (hypertension)    Hyperlipemia    Hypothyroidism    Increased liver enzymes    Interstitial lung disease (HCC)    PONV (postoperative nausea and vomiting)      Past Surgical History:  Procedure Laterality Date   BREAST BIOPSY Left 12/28/2010   BREAST EXCISIONAL BIOPSY Left 12/28/2010   BREAST SURGERY Bilateral    reduction over 20 years   CARDIOVASCULAR STRESS TEST  05/1998   breast attenuation in anterior septal wall, EF 63%   CHOLECYSTECTOMY     FOOT SURGERY     HIP SURGERY     HYSTEROSCOPY     REDUCTION MAMMAPLASTY Bilateral    over 20 years ago   REVERSE SHOULDER ARTHROPLASTY Left 05/12/2016   Procedure: LEFT REVERSE TOTAL SHOULDER ARTHROPLASTY;  Surgeon: Marcey Her, MD;  Location: [facility] OR;  Service: Orthopedics;  Laterality: Left;    is allergic to keppra  [levetiracetam ], other, rosuvastatin calcium , statins, and lamictal  [lamotrigine ].   Family History  Problem Relation Age of Onset   Depression Mother        stroke   Stroke Father    Drug abuse Daughter    Depression Daughter    Heart attack Brother 47   Breast cancer Neg Hx     Social History   Tobacco Use   Smoking status: Never   Smokeless tobacco: Never  Vaping Use   Vaping status:  Never Used  Substance Use Topics   Alcohol  use: Yes    Alcohol /week: 2.0 standard drinks of alcohol     Types: 2 Glasses of wine per week    Comment: occasionally   Drug use: No     Review of Systems was negative for a full 10 system review except as noted in the History of Present Illness.   Current Outpatient Medications:    amLODipine  (NORVASC ) 5 MG tablet, Take 1 tablet (5 mg total) by mouth daily., Disp: 90 tablet, Rfl: 3   Calcium  Carbonate-Vitamin D (CALCIUM -VITAMIN D3 PO), Take 15 mLs by mouth 2 (two) times daily., Disp: , Rfl:    CINNAMON PO, Take 1 capsule by mouth 2 (two) times daily., Disp: , Rfl:    clonazePAM  (KLONOPIN ) 1 MG tablet, Take 0.5 mg by mouth 2 (two) times daily as needed for anxiety., Disp: , Rfl:    diclofenac  Sodium (VOLTAREN ) 1 % GEL, APPLY 2 GRAMS 4 TIMES A DAY AS NEEDED, Disp: 100 g, Rfl: 3   Evolocumab  (REPATHA  SURECLICK) 140 MG/ML SOAJ, INJECT 1 DOSE INTO THE SKIN EVERY 14 DAYS, Disp: 2 mL, Rfl: 6   HYDROcodone -acetaminophen  (NORCO/VICODIN) 5-325 MG tablet, Take 1 tablet by mouth every 6 (six) hours as needed for up to 15 doses for severe pain., Disp: 15 tablet, Rfl: 0   levothyroxine  (SYNTHROID ) 88 MCG tablet, Take  88 mcg by mouth daily., Disp: , Rfl:    lidocaine -prilocaine  (EMLA ) cream, 1 gram qid prn, Disp: 30 g, Rfl: 11   Multiple Vitamin (MULTIVITAMIN WITH MINERALS) TABS tablet, Take 1 tablet by mouth daily., Disp: , Rfl:    olmesartan (BENICAR) 40 MG tablet, olmesartan 40 mg tablet  TAKE 1 TABLET BY MOUTH EVERY DAY, Disp: , Rfl:    Objective Vitals:   09/03/23 0943  BP: (!) 147/82  Pulse: (!) 45    Gen: NAD CV: S1 S2 RRR Lungs: Clear to auscultation bilaterally Abd: soft, nontender   Previous Pelvic Exam showed: POP-Q   2                                            Aa   2                                           Ba   1                                              C    3.5                                            Gh   3.5                                             Pb   6                                            tvl    -2                                            Ap   -2                                            Bp   -3.5                                              D      Assessment/ Plan  Assessment: The patient is a 83 y.o. year old scheduled to undergo Exam under anesthesia, anterior and posterior repair, sacrospinous hysteropexy, cystoscopy . Verbal consent was obtained for these procedures.  Plan: General Surgical Consent: The patient has previously been counseled on alternative treatments, and the decision by the patient and provider was to proceed with the procedure  listed above.  For all procedures, there are risks of bleeding, infection, damage to surrounding organs including but not limited to bowel, bladder, blood vessels, ureters and nerves, and need for further surgery if an injury were to occur. These risks are all low with minimally invasive surgery.   There are risks of numbness and weakness at any body site or buttock/rectal pain.  It is possible that baseline pain can be worsened by surgery, either with or without mesh. If surgery is vaginal, there is also a low risk of possible conversion to laparoscopy or open abdominal incision where indicated. Very rare risks include blood transfusion, blood clot, heart attack, pneumonia, or death.   There is also a risk of short-term postoperative urinary retention with need to use a catheter. About half of patients need to go home from surgery with a catheter, which is then later removed in the office. The risk of long-term need for a catheter is very low. There is also a risk of worsening of overactive bladder.    Prolapse (with or without mesh): Risk factors for surgical failure  include things that put pressure on your pelvis and the surgical repair, including obesity, chronic cough, and heavy lifting or straining (including lifting children  or adults, straining on the toilet, or lifting heavy objects such as furniture or anything weighing >25 lbs. Risks of recurrence is 20-30% with vaginal native tissue repair and a less than 10% with sacrocolpopexy with mesh.     We discussed consent for blood products. Risks for blood transfusion include allergic reactions, other reactions that can affect different body organs and managed accordingly, transmission of infectious diseases such as HIV or Hepatitis. However, the blood is screened. Patient consents for blood products.  Pre-operative instructions:  She was instructed to not take Aspirin/NSAIDs x 7days prior to surgery.   Antibiotic prophylaxis was ordered as indicated.  Catheter use: Patient will go home with foley if needed after post-operative voiding trial.  Post-operative instructions:  She was provided with specific post-operative instructions, including precautions and signs/symptoms for which we would recommend contacting us , in addition to daytime and after-hours contact phone numbers. This was provided on a handout.   Post-operative medications: Prescriptions for motrin , tylenol , miralax , and oxycodone  were sent to her pharmacy. Discussed using ibuprofen  and tylenol  on a schedule to limit use of narcotics.   Laboratory testing:  We will check labs: As requested by anesthesia  Preoperative clearance:  She does require surgical clearance.  Pulmonary clearance in chart.  Post-operative follow-up:  A post-operative appointment will be made for 6 weeks from the date of surgery. If she needs a post-operative nurse visit for a voiding trial, that will be set up after she leaves the hospital.    Patient will call the clinic or use MyChart should anything change or any new issues arise.   Mohammedali Bedoy G Rossie Bretado, NP

## 2023-09-03 NOTE — H&P (Signed)
 [facility] H&P  Subjective Chief Complaint: Nicole [name] presents for a preoperative encounter.   History of Present Illness: Nicole [name] is a 83 y.o. female who presents for preoperative visit.  She is scheduled to undergo Exam under anesthesia, anterior and posterior repair, sacrospinous hysteropexy, cystoscopy  on 09/10/23.  Her symptoms include Pelvic organ prolapse, and she was was found to have Stage III anterior, Stage I posterior, Stage II apical prolapse.   Urodynamics showed: Deferred  Past Medical History:  Diagnosis Date   Anxiety    Arthritis    Bronchiectasis (HCC)    Cancer (HCC)    Basal cell   GERD (gastroesophageal reflux disease)    HTN (hypertension)    Hyperlipemia    Hypothyroidism    Increased liver enzymes    Interstitial lung disease (HCC)    PONV (postoperative nausea and vomiting)      Past Surgical History:  Procedure Laterality Date   BREAST BIOPSY Left 12/28/2010   BREAST EXCISIONAL BIOPSY Left 12/28/2010   BREAST SURGERY Bilateral    reduction over 20 years   CARDIOVASCULAR STRESS TEST  05/1998   breast attenuation in anterior septal wall, EF 63%   CHOLECYSTECTOMY     FOOT SURGERY     HIP SURGERY     HYSTEROSCOPY     REDUCTION MAMMAPLASTY Bilateral    over 20 years ago   REVERSE SHOULDER ARTHROPLASTY Left 05/12/2016   Procedure: LEFT REVERSE TOTAL SHOULDER ARTHROPLASTY;  Surgeon: Marcey Her, MD;  Location: [facility] OR;  Service: Orthopedics;  Laterality: Left;    is allergic to keppra  [levetiracetam ], other, rosuvastatin calcium , statins, and lamictal  [lamotrigine ].   Family History  Problem Relation Age of Onset   Depression Mother        stroke   Stroke Father    Drug abuse Daughter    Depression Daughter    Heart attack Brother 80   Breast cancer Neg Hx     Social History   Tobacco Use   Smoking status: Never   Smokeless tobacco: Never  Vaping Use   Vaping status: Never Used   Substance Use Topics   Alcohol  use: Yes    Alcohol /week: 2.0 standard drinks of alcohol     Types: 2 Glasses of wine per week    Comment: occasionally   Drug use: No     Review of Systems was negative for a full 10 system review except as noted in the History of Present Illness.  No current facility-administered medications for this encounter.  Current Outpatient Medications:    acetaminophen  (TYLENOL ) 500 MG tablet, Take 1 tablet (500 mg total) by mouth every 6 (six) hours as needed (pain)., Disp: 30 tablet, Rfl: 0   amLODipine  (NORVASC ) 5 MG tablet, Take 1 tablet (5 mg total) by mouth daily., Disp: 90 tablet, Rfl: 3   Calcium  Carbonate-Vitamin D (CALCIUM -VITAMIN D3 PO), Take 15 mLs by mouth 2 (two) times daily., Disp: , Rfl:    CINNAMON PO, Take 1 capsule by mouth 2 (two) times daily., Disp: , Rfl:    clonazePAM  (KLONOPIN ) 1 MG tablet, Take 0.5 mg by mouth 2 (two) times daily as needed for anxiety., Disp: , Rfl:    diclofenac  Sodium (VOLTAREN ) 1 % GEL, APPLY 2 GRAMS 4 TIMES A DAY AS NEEDED, Disp: 100 g, Rfl: 3   Evolocumab  (REPATHA  SURECLICK) 140 MG/ML SOAJ, INJECT 1 DOSE INTO THE SKIN EVERY 14 DAYS, Disp: 2 mL, Rfl: 6   HYDROcodone -acetaminophen  (NORCO/VICODIN) 5-325 MG  tablet, Take 1 tablet by mouth every 6 (six) hours as needed for up to 15 doses for severe pain., Disp: 15 tablet, Rfl: 0   ibuprofen  (ADVIL ) 600 MG tablet, Take 1 tablet (600 mg total) by mouth every 6 (six) hours as needed., Disp: 30 tablet, Rfl: 0   levothyroxine  (SYNTHROID ) 88 MCG tablet, Take 88 mcg by mouth daily., Disp: , Rfl:    lidocaine -prilocaine  (EMLA ) cream, 1 gram qid prn, Disp: 30 g, Rfl: 11   Multiple Vitamin (MULTIVITAMIN WITH MINERALS) TABS tablet, Take 1 tablet by mouth daily., Disp: , Rfl:    olmesartan (BENICAR) 40 MG tablet, olmesartan 40 mg tablet  TAKE 1 TABLET BY MOUTH EVERY DAY, Disp: , Rfl:    oxyCODONE  (OXY IR/ROXICODONE ) 5 MG immediate release tablet, Take 1 tablet (5 mg total) by mouth  every 4 (four) hours as needed for severe pain (pain score 7-10)., Disp: 15 tablet, Rfl: 0   polyethylene glycol powder (GLYCOLAX /MIRALAX ) 17 GM/SCOOP powder, Take 17 g by mouth daily. Drink 17g (1 scoop) dissolved in water per day., Disp: 255 g, Rfl: 0   Objective There were no vitals filed for this visit.   Gen: NAD CV: S1 S2 RRR Lungs: Clear to auscultation bilaterally Abd: soft, nontender   Previous Pelvic Exam showed: POP-Q   2                                            Aa   2                                           Ba   1                                              C    3.5                                            Gh   3.5                                            Pb   6                                            tvl    -2                                            Ap   -2  Bp   -3.5                                              D      Assessment/ Plan  Assessment: The patient is a 83 y.o. year old scheduled to undergo Exam under anesthesia, anterior and posterior repair, sacrospinous hysteropexy, cystoscopy . Verbal consent was obtained for these procedures.

## 2023-09-07 ENCOUNTER — Encounter (HOSPITAL_COMMUNITY): Payer: Self-pay | Admitting: Obstetrics and Gynecology

## 2023-09-07 NOTE — Progress Notes (Signed)
 Spoke w/ via phone for pre-op interview--- pt Lab needs dos----   bmp /  EKG  Lab results------ current HR CT Chest in epic dated 02-16-2023 COVID test -----patient states asymptomatic no test needed Arrive at ------- 1000 on 09-10-2023 NPO after MN NO Solid Food.  Clear liquids from MN until--- 0900 Pre-Surgery Ensure or G2: n/a  Med rec completed Medications to take morning of surgery ----- prilosec, synthroid , if needed take clonazepam , it you start taking norvasc  then take morning of surgery Diabetic medication ----- n/a  GLP1 agonist last dose: n/a GLP1 instructions:  Patient instructed no nail polish to be worn day of surgery Patient instructed to bring photo id and insurance card day of surgery Patient aware to have Driver (ride ) / caregiver    for 24 hours after surgery - daughter, laura solmon Patient Special Instructions ----- antibacterial soap shower morning of surgery Pre-Op special Instructions -----  pt has pulmonary clearance by Dr C. Young dated 08-17-2023 in epic chart  Patient verbalized understanding of instructions that were given at this phone interview. Patient denies chest pain, sob, fever, cough at the interview.    Anesthesia Review:  HTN;  ILD/ UIP bronchiectasis-- pt has pulmonary clearance by Dr C. Young in epic/ chart. Pt denies sob w/ stairs/ long distance/ household chores  PCP:  Dr JONELLE. Vernon (lov 08-29-2023) Cardiologist : Dr Mona [facility] 07-28-2022) for HTN/ HLD  Chest x-ray :  HR CT chest 02-16-2023 EKG :  07-28-2022 Echo : no Stress test:  [facility] 05-29-1998/  [facility] 06-11-2019 Cardiac Cath :   no  Activity level:   see above Sleep Study/ CPAP : no Fasting Blood Sugar :      / Checks Blood Sugar -- times a day:  pre-diabetes  Blood Thinner/ Instructions /Last Dose:  no ASA / Instructions/ Last Dose :   no

## 2023-09-10 ENCOUNTER — Encounter (HOSPITAL_COMMUNITY): Payer: Self-pay | Admitting: Obstetrics and Gynecology

## 2023-09-10 ENCOUNTER — Ambulatory Visit (HOSPITAL_COMMUNITY): Admitting: Anesthesiology

## 2023-09-10 ENCOUNTER — Other Ambulatory Visit: Payer: Self-pay

## 2023-09-10 ENCOUNTER — Telehealth: Payer: Self-pay | Admitting: Obstetrics and Gynecology

## 2023-09-10 ENCOUNTER — Ambulatory Visit (HOSPITAL_BASED_OUTPATIENT_CLINIC_OR_DEPARTMENT_OTHER): Admitting: Anesthesiology

## 2023-09-10 ENCOUNTER — Encounter (HOSPITAL_COMMUNITY)
Admission: RE | Disposition: A | Discharge status: Home / Self Care | Payer: Self-pay | Source: Home / Self Care | Attending: Obstetrics and Gynecology

## 2023-09-10 ENCOUNTER — Ambulatory Visit (HOSPITAL_COMMUNITY)
Admission: RE | Admit: 2023-09-10 | Discharge: 2023-09-10 | Disposition: A | Discharge status: Home / Self Care | Attending: Obstetrics and Gynecology | Admitting: Obstetrics and Gynecology

## 2023-09-10 DIAGNOSIS — J449 Chronic obstructive pulmonary disease, unspecified: Secondary | ICD-10-CM | POA: Insufficient documentation

## 2023-09-10 DIAGNOSIS — N812 Incomplete uterovaginal prolapse: Secondary | ICD-10-CM

## 2023-09-10 DIAGNOSIS — F32A Depression, unspecified: Secondary | ICD-10-CM | POA: Insufficient documentation

## 2023-09-10 DIAGNOSIS — J849 Interstitial pulmonary disease, unspecified: Secondary | ICD-10-CM | POA: Insufficient documentation

## 2023-09-10 DIAGNOSIS — N819 Female genital prolapse, unspecified: Secondary | ICD-10-CM | POA: Diagnosis not present

## 2023-09-10 DIAGNOSIS — N189 Chronic kidney disease, unspecified: Secondary | ICD-10-CM | POA: Diagnosis not present

## 2023-09-10 DIAGNOSIS — I129 Hypertensive chronic kidney disease with stage 1 through stage 4 chronic kidney disease, or unspecified chronic kidney disease: Secondary | ICD-10-CM | POA: Insufficient documentation

## 2023-09-10 DIAGNOSIS — K219 Gastro-esophageal reflux disease without esophagitis: Secondary | ICD-10-CM | POA: Insufficient documentation

## 2023-09-10 DIAGNOSIS — Z79890 Hormone replacement therapy (postmenopausal): Secondary | ICD-10-CM | POA: Insufficient documentation

## 2023-09-10 DIAGNOSIS — F419 Anxiety disorder, unspecified: Secondary | ICD-10-CM | POA: Diagnosis not present

## 2023-09-10 DIAGNOSIS — Z79899 Other long term (current) drug therapy: Secondary | ICD-10-CM | POA: Diagnosis not present

## 2023-09-10 DIAGNOSIS — Z01818 Encounter for other preprocedural examination: Secondary | ICD-10-CM

## 2023-09-10 DIAGNOSIS — M1990 Unspecified osteoarthritis, unspecified site: Secondary | ICD-10-CM | POA: Diagnosis not present

## 2023-09-10 DIAGNOSIS — E039 Hypothyroidism, unspecified: Secondary | ICD-10-CM | POA: Diagnosis not present

## 2023-09-10 HISTORY — PX: PERINEOPLASTY: SHX2218

## 2023-09-10 HISTORY — PX: VAGINAL PROLAPSE REPAIR: SHX830

## 2023-09-10 HISTORY — DX: Venous insufficiency (chronic) (peripheral): I87.2

## 2023-09-10 HISTORY — DX: Anxiety disorder, unspecified: F41.9

## 2023-09-10 HISTORY — PX: CYSTOSCOPY: SHX5120

## 2023-09-10 HISTORY — DX: Paresthesia of skin: R20.2

## 2023-09-10 HISTORY — DX: Diverticulosis of large intestine without perforation or abscess without bleeding: K57.30

## 2023-09-10 HISTORY — DX: Sleep disorder, unspecified: G47.9

## 2023-09-10 HISTORY — DX: Fatty (change of) liver, not elsewhere classified: K76.0

## 2023-09-10 HISTORY — PX: EXAM UNDER ANESTHESIA, PELVIC: SHX7461

## 2023-09-10 HISTORY — DX: Chronic kidney disease, stage 1: N18.1

## 2023-09-10 HISTORY — DX: Diaphragmatic hernia without obstruction or gangrene: K44.9

## 2023-09-10 HISTORY — DX: Bradycardia, unspecified: R00.1

## 2023-09-10 HISTORY — DX: Prediabetes: R73.03

## 2023-09-10 HISTORY — PX: ANTERIOR AND POSTERIOR REPAIR: SHX5121

## 2023-09-10 HISTORY — DX: Age-related osteoporosis without current pathological fracture: M81.0

## 2023-09-10 HISTORY — DX: Personal history of other malignant neoplasm of skin: Z85.828

## 2023-09-10 HISTORY — DX: Unspecified urinary incontinence: R32

## 2023-09-10 HISTORY — DX: Restless legs syndrome: G25.81

## 2023-09-10 HISTORY — DX: Female genital prolapse, unspecified: N81.9

## 2023-09-10 LAB — BASIC METABOLIC PANEL WITH GFR
Anion gap: 10 (ref 5–15)
BUN: 8 mg/dL (ref 8–23)
CO2: 21 mmol/L — ABNORMAL LOW (ref 22–32)
Calcium: 8.9 mg/dL (ref 8.9–10.3)
Chloride: 105 mmol/L (ref 98–111)
Creatinine, Ser: 0.97 mg/dL (ref 0.44–1.00)
GFR, Estimated: 58 mL/min — ABNORMAL LOW (ref >60)
Glucose, Bld: 93 mg/dL (ref 70–99)
Potassium: 4.1 mmol/L (ref 3.5–5.1)
Sodium: 136 mmol/L (ref 135–145)

## 2023-09-10 SURGERY — ANTERIOR (CYSTOCELE) AND POSTERIOR REPAIR (RECTOCELE)
Anesthesia: General

## 2023-09-10 MED ORDER — FENTANYL CITRATE (PF) 250 MCG/5ML IJ SOLN
INTRAMUSCULAR | Status: AC
  Filled 2023-09-10: qty 5

## 2023-09-10 MED ORDER — LACTATED RINGERS IV SOLN: INTRAVENOUS | Status: DC | PRN

## 2023-09-10 MED ORDER — PHENYLEPHRINE 80 MCG/ML (10ML) SYRINGE FOR IV PUSH (FOR BLOOD PRESSURE SUPPORT)
PREFILLED_SYRINGE | INTRAVENOUS | Status: DC | PRN
  Administered 2023-09-10: 80 ug via INTRAVENOUS

## 2023-09-10 MED ORDER — WHITE PETROLATUM EX OINT
TOPICAL_OINTMENT | CUTANEOUS | Status: AC
Start: 2023-09-10 — End: 2023-09-10
  Filled 2023-09-10: qty 28.35

## 2023-09-10 MED ORDER — PHENYLEPHRINE HCL (PRESSORS) 10 MG/ML IV SOLN
INTRAVENOUS | Status: AC
  Filled 2023-09-10: qty 1

## 2023-09-10 MED ORDER — PHENAZOPYRIDINE HCL 100 MG PO TABS
ORAL_TABLET | ORAL | Status: AC
  Filled 2023-09-10: qty 2

## 2023-09-10 MED ORDER — ENOXAPARIN SODIUM 40 MG/0.4ML IJ SOSY
40.0000 mg | PREFILLED_SYRINGE | INTRAMUSCULAR | Status: AC
  Administered 2023-09-10: 40 mg via SUBCUTANEOUS

## 2023-09-10 MED ORDER — GABAPENTIN 300 MG PO CAPS
ORAL_CAPSULE | ORAL | Status: AC
  Filled 2023-09-10: qty 1

## 2023-09-10 MED ORDER — LIDOCAINE-EPINEPHRINE 1 %-1:100000 IJ SOLN
INTRAMUSCULAR | Status: DC | PRN
  Administered 2023-09-10: 19 mL

## 2023-09-10 MED ORDER — ORAL CARE MOUTH RINSE: 15.0000 mL | Freq: Once | OROMUCOSAL | Status: AC

## 2023-09-10 MED ORDER — ONDANSETRON HCL 4 MG/2ML IJ SOLN
INTRAMUSCULAR | Status: DC | PRN
  Administered 2023-09-10: 4 mg via INTRAVENOUS

## 2023-09-10 MED ORDER — CEFAZOLIN SODIUM-DEXTROSE 2-4 GM/100ML-% IV SOLN
2.0000 g | INTRAVENOUS | Status: AC
  Administered 2023-09-10: 2 g via INTRAVENOUS

## 2023-09-10 MED ORDER — FENTANYL CITRATE (PF) 100 MCG/2ML IJ SOLN
25.0000 ug | INTRAMUSCULAR | Status: DC | PRN
  Administered 2023-09-10 (×2): 25 ug via INTRAVENOUS

## 2023-09-10 MED ORDER — GABAPENTIN 300 MG PO CAPS
300.0000 mg | ORAL_CAPSULE | ORAL | Status: AC
  Administered 2023-09-10: 300 mg via ORAL

## 2023-09-10 MED ORDER — ONDANSETRON HCL 4 MG/2ML IJ SOLN
INTRAMUSCULAR | Status: AC
  Filled 2023-09-10: qty 2

## 2023-09-10 MED ORDER — ENOXAPARIN SODIUM 40 MG/0.4ML IJ SOSY
PREFILLED_SYRINGE | INTRAMUSCULAR | Status: AC
  Filled 2023-09-10: qty 0.4

## 2023-09-10 MED ORDER — LACTATED RINGERS IV SOLN: INTRAVENOUS | Status: DC

## 2023-09-10 MED ORDER — LIDOCAINE 2% (20 MG/ML) 5 ML SYRINGE
INTRAMUSCULAR | Status: AC
Start: 2023-09-10 — End: 2023-09-10
  Filled 2023-09-10: qty 5

## 2023-09-10 MED ORDER — OXYCODONE HCL 5 MG PO TABS: 5.0000 mg | ORAL_TABLET | Freq: Once | ORAL | Status: DC | PRN

## 2023-09-10 MED ORDER — SODIUM CHLORIDE 0.9 % IR SOLN
Status: DC | PRN
  Administered 2023-09-10: 1000 mL via INTRAVESICAL

## 2023-09-10 MED ORDER — CEFAZOLIN SODIUM-DEXTROSE 2-4 GM/100ML-% IV SOLN
INTRAVENOUS | Status: AC
  Filled 2023-09-10: qty 100

## 2023-09-10 MED ORDER — DEXAMETHASONE SODIUM PHOSPHATE 10 MG/ML IJ SOLN
INTRAMUSCULAR | Status: DC | PRN
  Administered 2023-09-10: 5 mg via INTRAVENOUS

## 2023-09-10 MED ORDER — CHLORHEXIDINE GLUCONATE 0.12 % MT SOLN
15.0000 mL | Freq: Once | OROMUCOSAL | Status: AC
  Administered 2023-09-10: 15 mL via OROMUCOSAL

## 2023-09-10 MED ORDER — PROPOFOL 10 MG/ML IV BOLUS
INTRAVENOUS | Status: DC | PRN
  Administered 2023-09-10: 50 mg via INTRAVENOUS
  Administered 2023-09-10: 110 ug/kg/min via INTRAVENOUS
  Administered 2023-09-10: 20 mg via INTRAVENOUS
  Administered 2023-09-10: 50 mg via INTRAVENOUS

## 2023-09-10 MED ORDER — LIDOCAINE-EPINEPHRINE 1 %-1:100000 IJ SOLN
INTRAMUSCULAR | Status: AC
  Filled 2023-09-10: qty 2

## 2023-09-10 MED ORDER — PROPOFOL 10 MG/ML IV BOLUS
INTRAVENOUS | Status: AC
  Filled 2023-09-10: qty 20

## 2023-09-10 MED ORDER — LIDOCAINE 2% (20 MG/ML) 5 ML SYRINGE
INTRAMUSCULAR | Status: DC | PRN
  Administered 2023-09-10: 50 mg via INTRAVENOUS

## 2023-09-10 MED ORDER — EPHEDRINE SULFATE-NACL 50-0.9 MG/10ML-% IV SOSY
PREFILLED_SYRINGE | INTRAVENOUS | Status: DC | PRN
  Administered 2023-09-10: 10 mg via INTRAVENOUS

## 2023-09-10 MED ORDER — ACETAMINOPHEN 500 MG PO TABS
1000.0000 mg | ORAL_TABLET | ORAL | Status: AC
  Administered 2023-09-10: 1000 mg via ORAL

## 2023-09-10 MED ORDER — CHLORHEXIDINE GLUCONATE 0.12 % MT SOLN
OROMUCOSAL | Status: AC
  Filled 2023-09-10: qty 15

## 2023-09-10 MED ORDER — ACETAMINOPHEN 500 MG PO TABS
ORAL_TABLET | ORAL | Status: DC
Start: 2023-09-10 — End: 2023-09-10
  Filled 2023-09-10: qty 2

## 2023-09-10 MED ORDER — ONDANSETRON HCL 4 MG/2ML IJ SOLN: 4.0000 mg | Freq: Once | INTRAMUSCULAR | Status: DC | PRN

## 2023-09-10 MED ORDER — DEXAMETHASONE SODIUM PHOSPHATE 10 MG/ML IJ SOLN
INTRAMUSCULAR | Status: AC
  Filled 2023-09-10: qty 1

## 2023-09-10 MED ORDER — OXYCODONE HCL 5 MG/5ML PO SOLN: 5.0000 mg | Freq: Once | ORAL | Status: DC | PRN

## 2023-09-10 MED ORDER — 0.9 % SODIUM CHLORIDE (POUR BTL) OPTIME
TOPICAL | Status: DC | PRN
  Administered 2023-09-10: 1000 mL

## 2023-09-10 MED ORDER — FENTANYL CITRATE (PF) 250 MCG/5ML IJ SOLN
INTRAMUSCULAR | Status: DC | PRN
  Administered 2023-09-10 (×2): 50 ug via INTRAVENOUS

## 2023-09-10 MED ORDER — FENTANYL CITRATE (PF) 100 MCG/2ML IJ SOLN
INTRAMUSCULAR | Status: AC
  Filled 2023-09-10: qty 2

## 2023-09-10 MED ORDER — PHENAZOPYRIDINE HCL 100 MG PO TABS
200.0000 mg | ORAL_TABLET | ORAL | Status: AC
  Administered 2023-09-10: 200 mg via ORAL

## 2023-09-10 SURGICAL SUPPLY — 41 items
BLADE CLIPPER SENSICLIP SURGIC (BLADE) ×2 IMPLANT
BLADE SURG 15 STRL LF DISP TIS (BLADE) ×2 IMPLANT
DEVICE CAPIO SLIM SINGLE (INSTRUMENTS) IMPLANT
ELECTRODE REM PT RTRN 9FT ADLT (ELECTROSURGICAL) IMPLANT
GAUZE 4X4 16PLY ~~LOC~~+RFID DBL (SPONGE) IMPLANT
GLOVE BIOGEL PI IND STRL 6.5 (GLOVE) ×2 IMPLANT
GLOVE BIOGEL PI IND STRL 7.0 (GLOVE) ×2 IMPLANT
GLOVE ECLIPSE 6.0 STRL STRAW (GLOVE) ×2 IMPLANT
GOWN STRL REUS W/ TWL LRG LVL3 (GOWN DISPOSABLE) ×2 IMPLANT
GOWN STRL REUS W/TWL LRG LVL3 (GOWN DISPOSABLE) ×2 IMPLANT
HIBICLENS CHG 4% 4OZ (MISCELLANEOUS) ×2 IMPLANT
HIBICLENS CHG 4% 4OZ BTL (MISCELLANEOUS) ×2 IMPLANT
HOLDER FOLEY CATH W/STRAP (MISCELLANEOUS) ×2 IMPLANT
IV NS 1000ML BAXH (IV SOLUTION) IMPLANT
KIT TURNOVER KIT B (KITS) ×2 IMPLANT
MANIFOLD NEPTUNE II (INSTRUMENTS) ×2 IMPLANT
NDL HYPO 22X1.5 SAFETY MO (MISCELLANEOUS) ×2 IMPLANT
NDL MAYO 6 CRC TAPER PT (NEEDLE) ×2 IMPLANT
NEEDLE HYPO 22X1.5 SAFETY MO (MISCELLANEOUS) ×2 IMPLANT
NEEDLE MAYO 6 CRC TAPER PT (NEEDLE) ×4 IMPLANT
NS IRRIG 1000ML POUR BTL (IV SOLUTION) ×2 IMPLANT
PACK VAGINAL WOMENS (CUSTOM PROCEDURE TRAY) ×2 IMPLANT
PAD OB MATERNITY 11 LF (PERSONAL CARE ITEMS) ×2 IMPLANT
RETRACTOR LONE STAR DISPOSABLE (INSTRUMENTS) ×2 IMPLANT
RETRACTOR STAY HOOK 5MM (MISCELLANEOUS) ×2 IMPLANT
SET CYSTO W/LG BORE CLAMP LF (SET/KITS/TRAYS/PACK) ×2 IMPLANT
SET IRRIG Y TYPE TUR BLADDER L (SET/KITS/TRAYS/PACK) ×2 IMPLANT
SLEEVE SCD COMPRESS KNEE MED (STOCKING) ×2 IMPLANT
SPIKE FLUID TRANSFER (MISCELLANEOUS) IMPLANT
SUCTION TUBE FRAZIER 10FR DISP (SUCTIONS) ×2 IMPLANT
SURGIFLO W/THROMBIN 8M KIT (HEMOSTASIS) IMPLANT
SUT ABS MONO DBL WITH NDL 48IN (SUTURE) IMPLANT
SUT CAPIO POLYGLYCOLIC (SUTURE) IMPLANT
SUT PDS PLUS AB 0 CT-2 (SUTURE) IMPLANT
SUT VIC AB 0 CT1 27XBRD ANBCTR (SUTURE) ×2 IMPLANT
SUT VIC AB 2-0 SH 27XBRD (SUTURE) ×2 IMPLANT
SUT VICRYL 2-0 SH 8X27 (SUTURE) ×2 IMPLANT
SYR BULB EAR ULCER 3OZ GRN STR (SYRINGE) ×2 IMPLANT
TOWEL GREEN STERILE (TOWEL DISPOSABLE) ×2 IMPLANT
TOWEL GREEN STERILE FF (TOWEL DISPOSABLE) ×2 IMPLANT
TRAY FOLEY W/BAG SLVR 14FR LF (SET/KITS/TRAYS/PACK) ×2 IMPLANT

## 2023-09-10 NOTE — OR Nursing (Signed)
 Fentanyl  50mcg wasted. Patient was no longer in pyxis.  Witnessed by Morna Fila RN Andree Guadeloupe

## 2023-09-10 NOTE — Interval H&P Note (Signed)
 History and Physical Interval Note:  09/10/2023 11:57 AM  Nicole [name]  has presented today for surgery, with the diagnosis of uterovag prolap[se incomplete.  The various methods of treatment have been discussed with the patient and family. After consideration of risks, benefits and other options for treatment, the patient has consented to  Procedure(s) with comments: ANTERIOR (CYSTOCELE) AND POSTERIOR REPAIR (RECTOCELE) (N/A) SUSPENSION, VAGINAL VAULT (N/A) - sacropinuous hysteropexy PERINEOPLASTY (N/A) CYSTOSCOPY (N/A) as a surgical intervention.  The patient's history has been reviewed, patient examined, no change in status, stable for surgery.  I have reviewed the patient's chart and labs.  Questions were answered to the patient's satisfaction.     Rosaline LOISE Caper

## 2023-09-10 NOTE — Discharge Instructions (Addendum)

## 2023-09-10 NOTE — Telephone Encounter (Signed)
 Nicole [name] underwent Anterior and posterior repair with perineorrhaphy, sacrospinous ligament fixation, cystoscopy on 09/10/23.   Nicole [name] failed Nicole [name] voiding trial.  was backfilled into the bladder Voided 30ml   Nicole [name] was discharged with a catheter. Please call Nicole [name] for a routine post op check and to schedule a voiding trial by end of week (8/21- 8/22). Thanks!  Rosaline LOISE Caper, MD

## 2023-09-10 NOTE — Anesthesia Procedure Notes (Signed)
 Procedure Name: LMA Insertion Date/Time: 09/10/2023 12:32 PM  Performed by: Obadiah Reyes BROCKS, CRNAPre-anesthesia Checklist: Patient identified, Emergency Drugs available, Suction available and Patient being monitored Patient Re-evaluated:Patient Re-evaluated prior to induction Oxygen Delivery Method: Circle System Utilized Preoxygenation: Pre-oxygenation with 100% oxygen Induction Type: IV induction Ventilation: Mask ventilation without difficulty LMA: LMA inserted LMA Size: 3.0 Number of attempts: 1 Airway Equipment and Method: Bite block Placement Confirmation: positive ETCO2 Tube secured with: Tape Dental Injury: Teeth and Oropharynx as per pre-operative assessment

## 2023-09-10 NOTE — Anesthesia Postprocedure Evaluation (Signed)
 Anesthesia Post Note  Patient: Chaneka Trefz  Procedure(s) Performed: ANTERIOR (CYSTOCELE) AND POSTERIOR REPAIR (RECTOCELE) SACROSPINOUS FIXATION PERINEOPLASTY CYSTOSCOPY EXAM UNDER ANESTHESIA, PELVIC     Patient location during evaluation: PACU Anesthesia Type: General Level of consciousness: awake and alert Pain management: pain level controlled Vital Signs Assessment: post-procedure vital signs reviewed and stable Respiratory status: spontaneous breathing, nonlabored ventilation, respiratory function stable and patient connected to nasal cannula oxygen Cardiovascular status: blood pressure returned to baseline and stable Postop Assessment: no apparent nausea or vomiting Anesthetic complications: no   No notable events documented.  Last Vitals:  Vitals:   09/10/23 1407 09/10/23 1425  BP: 117/75   Pulse: 66 61  Resp: 12 10  Temp:    SpO2: 97% 97%    Last Pain:  Vitals:   09/10/23 1435  TempSrc:   PainSc: 4                  Rome Ade

## 2023-09-10 NOTE — Op Note (Signed)
 Operative Note  Preoperative Diagnosis: anterior vaginal prolapse, posterior vaginal prolapse, and uterovaginal prolapse, incomplete  Postoperative Diagnosis: same  Procedures performed:  Anterior and posterior repair with perineorrhaphy, sacrospinous ligament fixation, cystoscopy  Implants: none  Attending Surgeon: Rosaline Caper, MD  Assistant: Jorene Moats, PA  Anesthesia: General LMA  Findings: 1. On vaginal exam, stage II prolapse present  2. On cystoscopy, normal bladder and urethral mucosa without injury or lesion. Brisk bilateral ureteral efflux present.    Specimens: none  Estimated blood loss: 50 mL  IV fluids: 800 mL  Urine output: 5 mL  Complications: none  Procedure in Detail:  After informed consent was obtained, the patient was taken to the operating room where anesthesia was induced and found to be adequate. She was placed in dorsal lithotomy position, taking care to avoid any traction on the extremities, and then prepped and draped in the usual sterile fashion. A self-retaining lonestar retractor was placed using four elastic blue stays.  After a foley catheter was inserted into the urethra, the location of the midurethra was palpated. Two Allis clamps were along the anterior vaginal wall defect. 1% lidocaine  with epinephrine  was injected into the vaginal mucosa.  A vertical incision was made between these two Allis clamps with a 15 blade scalpel.  Allis clamps were placed along this incision and Metzenbaum scissors were used to undermine the vaginal mucosa along the incision.  The vaginal mucosa was then sharply dissected off to the vesicovaginal septum bilaterally to the level of the pubic rami.    For the sacrospinous ligament fixation (SSLF), the ischial spine was accessed on the right side via dissection with Metzenbaum scissors and blunt dissection.  The sacrospinous ligament was palpated. Two 0 PDS suture was then placed at the sacrospinous ligament two  fingerbreadths medial to the ischial spine, in order to avoid the pudendal neurovascular bundle, using a Capio needle driver.  The PDS suture was attached to the vaginal epithelium on the ipsilateral side of the vaginal apex and held. Anterior plication of the vesicovaginal septum was then performed using mattress sutures of 2-0 Vicryl. The vaginal mucosal edges were trimmed and the incision reapproximated with 2-0 Vicryl in a running fashion. The SSLF suture was then tied down with excellent support of the anterior and apical vagina.  The Foley catheter was removed.  A 70-degree cystoscope was introduced, and 360-degree inspection revealed normal bladder mucosa, no trauma to the bladder, with brisk bilateral ureteral efflux.  The bladder was drained and the cystoscope was removed.  The Foley catheter was reinserted.   Attention was then turned to the posterior vagina.  Two Allis clamps were in the midline of the posterior vaginal wall defect and two at the introitus.  1% lidocaine  with epinephrine  was injected into the vaginal mucosa. A vertical incision was made between these clamps with a 15 blade scalpel and a diamond shaped was made over the perineum and excess skin removed.  The rectovaginal septum was then dissected off the vaginal mucosa bilaterally.  No enterocele was noted.  The rectovaginal septum was then plicated first with a pursestring then with mattress sutures of 2-0 Vicryl.  After placement of the first plication stitch two fingers were inserted into the vaginal to confirm adequate caliber.  The last distal stitch incorporated the perineal body in a U stitch fashion.  The bulbocavernosus muscles were reapproximated using interrupted stitches of 2-0 Vicryl and the hymenal ring was redeveloped. After plication, the excess vaginal mucosa was trimmed and  the vaginal mucosa was reapproximated using 2-0 Vicryl sutures in a running fashion. The perineal body was then reapproximated with a 0-vicryl  suture. The perineal skin was closed with a 2-0 vicryl in a subcutaneous then subcuticular fashion. The vagina was copiously irrigated.  Hemostasis was noted.  Vaginal packing was not placed.  A rectal examination was normal and confirmed no sutures within the rectum.  The patient tolerated the procedure well.  She was awakened from anesthesia and transferred to the recovery room in stable condition. All counts were correct x 2.    Rosaline LOISE Caper, MD

## 2023-09-10 NOTE — OR Nursing (Signed)
 Patient starts voiding trial. 300cc instilled into bladder via cathetor. Cathetor balloon deflated and removed.  Patient assisted to restroom.  Voided approx 30cc urine with small blood. Dr. Marilynne notified. Orders received to replace foley cathetor and discharge home with foley cathetor. 82F foley cathetor used using sterile technique. Expelled 450cc clear yellow orange urine. Patient and patients daughter given instructions on how to care for cathetor.  Patients daughter-Laura states she has worked as a Lawyer. Andree Kerns RN

## 2023-09-10 NOTE — Transfer of Care (Signed)
 Immediate Anesthesia Transfer of Care Note  Patient: Nicole [name]  Procedure(s) Performed: ANTERIOR (CYSTOCELE) AND POSTERIOR REPAIR (RECTOCELE) SACROSPINOUS FIXATION PERINEOPLASTY CYSTOSCOPY EXAM UNDER ANESTHESIA, PELVIC  Patient Location: PACU  Anesthesia Type:General  Level of Consciousness: awake, alert , and oriented  Airway & Oxygen Therapy: Patient Spontanous Breathing and Patient connected to nasal cannula oxygen  Post-op Assessment: Report given to RN and Post -op Vital signs reviewed and stable  Post vital signs: Reviewed and stable  Last Vitals:  Vitals Value Taken Time  BP 117/75 09/10/23 14:07  Temp    Pulse 65 09/10/23 14:09  Resp 13 09/10/23 14:09  SpO2 96 % 09/10/23 14:09  Vitals shown include unfiled device data.  Last Pain:  Vitals:   09/10/23 1010  TempSrc: Oral  PainSc: 0-No pain      Patients Stated Pain Goal: 5 (09/10/23 1010)  Complications: No notable events documented.

## 2023-09-10 NOTE — Anesthesia Preprocedure Evaluation (Signed)
 Anesthesia Evaluation  Patient identified by MRN, date of birth, ID band Patient awake    Reviewed: Allergy & Precautions, H&P , NPO status , Patient's Chart, lab work & pertinent test results  History of Anesthesia Complications (+) PONV and history of anesthetic complications  Airway Mallampati: II  TM Distance: >3 FB Neck ROM: Full    Dental no notable dental hx. (+) Teeth Intact, Dental Advisory Given   Pulmonary neg sleep apnea, COPD, Patient abstained from smoking.Not current smoker Interstitial lung disease, not on inhalers or home O2   Pulmonary exam normal breath sounds clear to auscultation       Cardiovascular Exercise Tolerance: Good METShypertension, Pt. on medications (-) CAD and (-) Past MI (-) dysrhythmias  Rhythm:Regular Rate:Normal - Systolic murmurs    Neuro/Psych  PSYCHIATRIC DISORDERS Anxiety Depression    negative neurological ROS     GI/Hepatic Neg liver ROS,GERD  Medicated and Controlled,,  Endo/Other  neg diabetesHypothyroidism    Renal/GU Renal InsufficiencyRenal disease  negative genitourinary   Musculoskeletal  (+) Arthritis , Osteoarthritis,    Abdominal   Peds  Hematology negative hematology ROS (+)   Anesthesia Other Findings Past Medical History: No date: Anxiety disorder No date: Arthritis No date: Bradycardia No date: Bronchiectasis (HCC) No date: CKD (chronic kidney disease), stage I No date: Depression No date: Diverticulosis of colon     Comment:  w/ hx diverticulitis No date: GERD (gastroesophageal reflux disease) No date: Hepatic steatosis No date: Hiatal hernia No date: History of basal cell carcinoma (BCC) excision No date: HTN (hypertension)     Comment:  followed by cardology--- dr mona and pcp;   CTC               06-11-2019  calcium  score= 2.3;  NUC 05-29-1998  LR w/               normal perfusion, nuclear ef 63% No date: Hyperlipemia     Comment:   intolerent to statins;  followed by cardiology--- dr               mona No date: Hypothyroidism     Comment:  followe by pcp No date: Interstitial lung disease (HCC)     Comment:  (09-07-2023  pt stated able to do stairs/ long distance               walk/ household chores w/ no SOB) pulmonary--- dr c.               young;  ILD/ UIP bronchiectasis;  PFT 04-12-2020  mim               obstruction/ mim DLCO deficit; No date: Osteoporosis No date: Paresthesia of both lower extremities     Comment:  neurologist--- dr onita;   negative EMG nerve conduction               test 02-12-2018 for neuropathy No date: PONV (postoperative nausea and vomiting) No date: Pre-diabetes No date: Prolapse of female pelvic organs No date: RLS (restless legs syndrome) No date: Sleep disorder     Comment:  hx sleep study in epic 04-28-2019  no OSA , AHI 1.2/hr No date: Urine incontinence     Comment:  urologist--- dr gaston No date: Venous insufficiency of leg  Reproductive/Obstetrics negative OB ROS  Anesthesia Physical Anesthesia Plan  ASA: 2  Anesthesia Plan: General   Post-op Pain Management: Tylenol  PO (pre-op)* and Gabapentin  PO (pre-op)*   Induction: Intravenous  PONV Risk Score and Plan: 4 or greater and Ondansetron , Dexamethasone , TIVA and Propofol  infusion  Airway Management Planned: LMA  Additional Equipment: None  Intra-op Plan:   Post-operative Plan: Extubation in OR  Informed Consent: I have reviewed the patients History and Physical, chart, labs and discussed the procedure including the risks, benefits and alternatives for the proposed anesthesia with the patient or authorized representative who has indicated his/her understanding and acceptance.     Dental advisory given  Plan Discussed with: CRNA  Anesthesia Plan Comments: (Discussed risks of anesthesia with patient, including PONV, sore throat, lip/dental/eye damage,  post operative cognitive dysfunction.. Rare risks discussed as well, such as cardiorespiratory and neurological sequelae, and allergic reactions. Discussed the role of CRNA in patient's perioperative care. Patient understands.)         Anesthesia Quick Evaluation

## 2023-09-11 ENCOUNTER — Encounter (HOSPITAL_COMMUNITY): Payer: Self-pay | Admitting: Obstetrics and Gynecology

## 2023-09-11 NOTE — Telephone Encounter (Signed)
 Appointment scheduled for 09-14-2023. Patient is refusing a morning appointment.

## 2023-09-14 ENCOUNTER — Ambulatory Visit

## 2023-09-14 NOTE — Telephone Encounter (Signed)
Left message for follow-up. 

## 2023-09-14 NOTE — Telephone Encounter (Signed)
 Nicole [name]  underwent Anterior (cystocele) And Posterior Repair (rectocele), Sacrospinous Fixation, Perineoplasty, Cystoscopy, and Exam Under Anesthesia, Pelvic  on 09/10/2023  with [] Dr Marilynne [] Dr Guadlupe.  The patient reports that her pain is controlled.  She is taking [] No Medication [] Acetaminophen  500mg  every 6 hours [] Ibuprofen  600mg  every 6 hours or []  Prescribed Narcotic.  Her pain level is 3[x] with medication  She has some spotting vaginal bleeding,  The patient is tolerating PO fluids and solids. She has had a bowel movement and is taking Miralax  for a bowel regimen. She stated her stools are hard and would increase her Miralax  to 1 capful a day. She is passing gas.  She was discharged with a catheter.   []  Discharged without a catheter, the patient feels as if she is emptying her bladder.  [] Discharged with a catheter, the patient is not having any concerns with her catheter.  She will return for a voiding trial. [] Verified scheduled date and time with patient.  She does not having any additional questions.  Reviewed Post operative instructions as needed to answer additional questions.   CC'd note to patient's provider.

## 2023-09-14 NOTE — Progress Notes (Signed)
 Pat underwent Anterior (cystocele) And Posterior Repair (rectocele), Sacrospinous Fixation, Perineoplasty, Cystoscopy, and Exam Under Anesthesia, Pelvic on 09/10/2023  She presents for a voiding trial.   Patient was identified with 2 identifiers.  The patient states she does not have any concerns with the foley placed.  250 mL of NS was instilled into the bladder via a catheter.  The catheter was removed and patient was instructed to void into the urinary hat, she ended up urinating all over the bed. She voided in the hat.  The post void residual measured by bladder scan was 14 mL.  She did pass the voiding trial.  The patient was not sent home with a catheter.    The patient received aftercare instructions and will follow up as scheduled.

## 2023-09-19 NOTE — Telephone Encounter (Signed)
 error

## 2023-09-20 ENCOUNTER — Ambulatory Visit: Admitting: Obstetrics and Gynecology

## 2023-09-20 ENCOUNTER — Other Ambulatory Visit (HOSPITAL_COMMUNITY)
Admission: RE | Admit: 2023-09-20 | Discharge: 2023-09-20 | Disposition: A | Discharge status: Home / Self Care | Source: Other Acute Inpatient Hospital | Attending: Obstetrics and Gynecology | Admitting: Obstetrics and Gynecology

## 2023-09-20 ENCOUNTER — Encounter: Payer: Self-pay | Admitting: Obstetrics and Gynecology

## 2023-09-20 VITALS — BP 143/73 | HR 83

## 2023-09-20 DIAGNOSIS — R82998 Other abnormal findings in urine: Secondary | ICD-10-CM | POA: Insufficient documentation

## 2023-09-20 DIAGNOSIS — R350 Frequency of micturition: Secondary | ICD-10-CM

## 2023-09-20 DIAGNOSIS — R319 Hematuria, unspecified: Secondary | ICD-10-CM

## 2023-09-20 LAB — POCT URINALYSIS DIP (CLINITEK)
Bilirubin, UA: NEGATIVE
Glucose, UA: NEGATIVE mg/dL
Ketones, POC UA: NEGATIVE mg/dL
Nitrite, UA: NEGATIVE
POC PROTEIN,UA: 30 — AB
Spec Grav, UA: <=1.005 — AB (ref 1.010–1.025)
Urobilinogen, UA: 0.2 U/dL
pH, UA: 5 (ref 5.0–8.0)

## 2023-09-20 LAB — URINALYSIS, ROUTINE W REFLEX MICROSCOPIC
Bilirubin Urine: NEGATIVE
Glucose, UA: NEGATIVE mg/dL
Ketones, ur: NEGATIVE mg/dL
Nitrite: POSITIVE — AB
Protein, ur: 30 mg/dL — AB
Specific Gravity, Urine: 1.004 — ABNORMAL LOW (ref 1.005–1.030)
WBC, UA: >50 WBC/hpf (ref 0–5)
pH: 7 (ref 5.0–8.0)

## 2023-09-20 MED ORDER — PHENAZOPYRIDINE HCL 95 MG PO TABS: 95.0000 mg | ORAL_TABLET | Freq: Three times a day (TID) | ORAL | 1 refills | Status: AC | PRN

## 2023-09-20 MED ORDER — SULFAMETHOXAZOLE-TRIMETHOPRIM 800-160 MG PO TABS: 1.0000 | ORAL_TABLET | Freq: Two times a day (BID) | ORAL | 0 refills | Status: AC

## 2023-09-20 NOTE — Progress Notes (Signed)
 [facility] Return Visit  SUBJECTIVE  History of Present Illness: Nicole [name] is a 83 y.o. female seen in follow-up for dysuria   Patient had: Exam under anesthesia, anterior and posterior repair, sacrospinous hysteropexy, cystoscopy  on 09/10/23.    Past Medical History: Patient  has a past medical history of Anxiety disorder, Arthritis, Bradycardia, Bronchiectasis (HCC), CKD (chronic kidney disease), stage I, Depression, Diverticulosis of colon, GERD (gastroesophageal reflux disease), Hepatic steatosis, Hiatal hernia, History of basal cell carcinoma (BCC) excision, HTN (hypertension), Hyperlipemia, Hypothyroidism, Interstitial lung disease (HCC), Osteoporosis, Paresthesia of both lower extremities, PONV (postoperative nausea and vomiting), Pre-diabetes, Prolapse of female pelvic organs, RLS (restless legs syndrome), Sleep disorder, Urine incontinence, and Venous insufficiency of leg.   Past Surgical History: She  has a past surgical history that includes Cholecystectomy open (1968); Bunionectomy with hammertoe reconstruction (Left, 12/14/2004); Excision/release bursa hip (Left, 04/29/2008); Reverse shoulder arthroplasty (Left, 05/12/2016); Reduction mammaplasty (Bilateral, 1985); Breast excisional biopsy (Left, 12/28/2010); Hysteroscopy with D & C; Cataract extraction w/ intraocular lens implant (Bilateral, 2018); Foot hardware removal (Left, 02/28/2005); Colonoscopy with esophagogastroduodenoscopy (egd) (2017); Tonsillectomy and adenoidectomy; Anterior and posterior repair (N/A, 09/10/2023); Vaginal prolapse repair (N/A, 09/10/2023); Perineoplasty (N/A, 09/10/2023); Cystoscopy (N/A, 09/10/2023); and Exam under anesthesia, pelvic (09/10/2023).   Medications: She has a current medication list which includes the following prescription(s): acetaminophen , amlodipine , calcium  carb-cholecalciferol, cinnamon, clonazepam , diclofenac  sodium, repatha  sureclick, cvs dry eye relief,  hydrocodone -acetaminophen , ibuprofen , levothyroxine , magic mouthwash (lidocaine , diphenhydrAMINE, alum & mag hydroxide) suspension, milk thistle, multiple vitamins-minerals, mupirocin ointment, olmesartan, omeprazole, oxycodone , phenazopyridine , pregabalin , sulfamethoxazole -trimethoprim , and zoledronic acid.   Allergies: Patient is allergic to chlorthalidone , crestor [rosuvastatin], keppra  [levetiracetam ], pitavastatin , lamictal  [lamotrigine ], and nitrofuran derivatives.   Social History: Patient  reports that she has never smoked. She has never used smokeless tobacco. She reports that she does not currently use alcohol . She reports that she does not use drugs.     OBJECTIVE    Lab Results  Component Value Date   COLORU yellow 09/20/2023   CLARITYU clear 09/20/2023   GLUCOSEUR negative 09/20/2023   BILIRUBINUR negative 09/20/2023   KETONESU Negative 03/03/2022   SPECGRAV <=1.005 (A) 09/20/2023   RBCUR moderate (A) 09/20/2023   PHUR 5.0 09/20/2023   PROTEINUR Positive (A) 03/03/2022   UROBILINOGEN 0.2 09/20/2023   LEUKOCYTESUR Large (3+) (A) 09/20/2023    Post Void Residual - 09/20/23 1529       Post Void Residual   Post Void Residual 30 mL           Physical Exam: Vitals:   09/20/23 1502  BP: (!) 143/73  Pulse: 83   Gen: No apparent distress, A&O x 3.  Detailed Urogynecologic Evaluation:  Deferred.    ASSESSMENT AND PLAN    Nicole [name] is a 83 y.o. with:  1. Leukocytes in urine   2. Urinary frequency    Patient has + signs of UTI with both blood and leukocytes present. Will send for culture. Order for bactrim  x2 daily for 3 days.  Patient's PVR  via bladder scan was 30ml, so no catheter done.   Patient to return for routine post op visit.     Nicole [name] G Zainab Crumrine, NP

## 2023-09-20 NOTE — Patient Instructions (Signed)
 Start the bactrim  x2 daily for 3 days. You can take the pyridium  for bladder irritation.

## 2023-09-22 LAB — URINE CULTURE: Culture: >=100000 — AB

## 2023-09-25 ENCOUNTER — Ambulatory Visit: Payer: Self-pay | Admitting: Obstetrics and Gynecology

## 2023-09-28 ENCOUNTER — Telehealth: Payer: Self-pay

## 2023-09-28 NOTE — Telephone Encounter (Signed)
 Patient is requesting a return to work letter stating her limatations  for work with lifting ,pushing and pulling.

## 2023-10-02 ENCOUNTER — Telehealth: Payer: Self-pay | Admitting: Obstetrics and Gynecology

## 2023-10-02 NOTE — Telephone Encounter (Signed)
 Letter provided with return to work restrictions.

## 2023-10-18 ENCOUNTER — Other Ambulatory Visit (HOSPITAL_COMMUNITY)
Admission: RE | Admit: 2023-10-18 | Discharge: 2023-10-18 | Disposition: A | Discharge status: Home / Self Care | Source: Other Acute Inpatient Hospital | Attending: Obstetrics and Gynecology | Admitting: Obstetrics and Gynecology

## 2023-10-18 ENCOUNTER — Ambulatory Visit (INDEPENDENT_AMBULATORY_CARE_PROVIDER_SITE_OTHER)

## 2023-10-18 VITALS — BP 163/80 | HR 58

## 2023-10-18 DIAGNOSIS — R350 Frequency of micturition: Secondary | ICD-10-CM | POA: Diagnosis not present

## 2023-10-18 DIAGNOSIS — R319 Hematuria, unspecified: Secondary | ICD-10-CM

## 2023-10-18 DIAGNOSIS — R82998 Other abnormal findings in urine: Secondary | ICD-10-CM | POA: Diagnosis not present

## 2023-10-18 LAB — URINALYSIS, ROUTINE W REFLEX MICROSCOPIC
Bilirubin Urine: NEGATIVE
Glucose, UA: NEGATIVE mg/dL
Ketones, ur: NEGATIVE mg/dL
Nitrite: NEGATIVE
Protein, ur: 100 mg/dL — AB
Specific Gravity, Urine: 1.016 (ref 1.005–1.030)
pH: 6 (ref 5.0–8.0)

## 2023-10-18 LAB — POCT URINALYSIS DIP (CLINITEK)
Bilirubin, UA: NEGATIVE
Glucose, UA: NEGATIVE mg/dL
Ketones, POC UA: NEGATIVE mg/dL
Nitrite, UA: NEGATIVE
POC PROTEIN,UA: 100 — AB
Spec Grav, UA: 1.015 (ref 1.010–1.025)
Urobilinogen, UA: 0.2 U/dL
pH, UA: 6 (ref 5.0–8.0)

## 2023-10-18 MED ORDER — SULFAMETHOXAZOLE-TRIMETHOPRIM 800-160 MG PO TABS: 1.0000 | ORAL_TABLET | Freq: Two times a day (BID) | ORAL | 0 refills | Status: DC

## 2023-10-18 NOTE — Patient Instructions (Signed)
 Your Urine dip that was done in office was Positive. I am sending the urine off for culture and you can take AZO over the counter for your discomfort.  We have ordered Bactrim for you to take while we wait for your culture results, hopefully this gives you some relief. We will contact you when the results are back between 3-5 days.  If a different antibiotic is needed we will sent the order to the pharmacy and you will be notified. If you have any questions or concerns please feel free to call us at [phone]

## 2023-10-18 NOTE — Progress Notes (Signed)
 Nicole [name] is a 83 y.o. female arrived today with UTI sx.  A urine specimen was collected and POCT Urine was done and urine culture sent to the lab. POCT Urine was POSITIVE for Leukoctyes Pt was notified and prescription sent to the preferred pharmacy.

## 2023-10-19 LAB — URINE CULTURE: Culture: NO GROWTH

## 2023-10-22 ENCOUNTER — Ambulatory Visit: Payer: Self-pay | Admitting: Obstetrics and Gynecology

## 2023-10-26 ENCOUNTER — Encounter: Payer: Self-pay | Admitting: Obstetrics and Gynecology

## 2023-10-26 ENCOUNTER — Ambulatory Visit: Admitting: Obstetrics and Gynecology

## 2023-10-26 VITALS — BP 114/77 | HR 76

## 2023-10-26 DIAGNOSIS — N952 Postmenopausal atrophic vaginitis: Secondary | ICD-10-CM | POA: Diagnosis not present

## 2023-10-26 DIAGNOSIS — Z87440 Personal history of urinary (tract) infections: Secondary | ICD-10-CM | POA: Diagnosis not present

## 2023-10-26 DIAGNOSIS — Z48816 Encounter for surgical aftercare following surgery on the genitourinary system: Secondary | ICD-10-CM | POA: Diagnosis not present

## 2023-10-26 DIAGNOSIS — Z9889 Other specified postprocedural states: Secondary | ICD-10-CM

## 2023-10-26 DIAGNOSIS — N390 Urinary tract infection, site not specified: Secondary | ICD-10-CM | POA: Diagnosis not present

## 2023-10-26 MED ORDER — ESTRADIOL 0.1 MG/GM VA CREA: TOPICAL_CREAM | VAGINAL | 11 refills | Status: AC

## 2023-10-26 NOTE — Progress Notes (Signed)
 [facility]  Date of Visit: 10/26/2023  History of Present Illness: Ms. Fong is a 83 y.o. female scheduled today for a post-operative visit.   Surgery: s/p Anterior and posterior repair with perineorrhaphy, sacrospinous ligament fixation, cystoscopy  on 09/10/23  She did not pass her postoperative void trial. Returned to office on 8/22 and passed voiding trial   Postoperative course has been uncomplicated.   Today she reports she is feeling well. She is happy with the results of the procedure. Denies vaginal bleeding or pain.   UTI in the last 6 weeks? Yes - treated 8/28 Pain? No  She has returned to her normal activity (except for postop restrictions) Vaginal bulge? No  Stress incontinence: No  Urgency/frequency: No  Urge incontinence: No  Voiding dysfunction: No  Bowel issues: taking miralax  once a week  Subjective Success: Do you usually have a bulge or something falling out that you can see or feel in the vaginal area? No  Retreatment Success: Any retreatment with surgery or pessary for any compartment? No    Medications: She has a current medication list which includes the following prescription(s): acetaminophen , amlodipine , calcium  carb-cholecalciferol, cinnamon, clonazepam , diclofenac  sodium, [START ON 10/29/2023] estradiol, repatha  sureclick, cvs dry eye relief, hydrocodone -acetaminophen , ibuprofen , levothyroxine , magic mouthwash (lidocaine , diphenhydrAMINE, alum & mag hydroxide) suspension, milk thistle, multiple vitamins-minerals, mupirocin ointment, olmesartan, omeprazole, oxycodone , phenazopyridine , pregabalin , sulfamethoxazole -trimethoprim , and zoledronic acid.   Allergies: Patient is allergic to chlorthalidone , crestor [rosuvastatin], keppra  [levetiracetam ], pitavastatin , lamictal  [lamotrigine ], and nitrofuran derivatives.   Physical Exam: BP 114/77   Pulse 76    Pelvic Examination: Vagina: Incisions healing well. Sutures are present at the cervix and  there is a small amount of granulation tissue, cauterized with silver nitrate. No tenderness along the anterior or posterior vagina. No apical tenderness. No pelvic masses. No visible or palpable mesh.  POP-Q: POP-Q  -3                                            Aa   -3                                           Ba  -7.5                                              C   2                                            Gh  4.5                                            Pb  8                                            tvl   -  3                                            Ap  -3                                            Bp  -8                                              D    ---------------------------------------------------------  Assessment and Plan:  1. Post-operative state   2. Recurrent urinary tract infection     - Can resume regular activity including exercise and intercourse,  if desired.  - Discussed avoidance of heavy lifting and straining long term to reduce the risk of recurrence.  - Start vaginal estrogen cream 0.5g twice a week for atrophy and prevention of UTI. We also discussed using coconut oil or vitamin E for moisture as needed - Letter provided to return to work.   Follow up as needed  Rosaline LOISE Caper, MD

## 2023-10-26 NOTE — Patient Instructions (Signed)
Start vaginal estrogen therapy nightly for two weeks then 2 times weekly at night for treatment of vaginal atrophy (dryness of the vaginal tissues).  Please let us know if the prescription is too expensive and we can look for alternative options.  ° ° °Vulvovaginal moisturizer Options: °Vitamin E oil (pump or capsule) or cream (Gene's Vit E Cream) °Coconut oil °Silicone-based lubricant for use during intercourse ("wet platinum" is a brand available at most drugstores) °Crisco °Consider the ingredients of the product - the fewer the ingredients the better! ° °Directions for Use: °Clean and dry your hands °Gently dab the vulvar/vaginal area dry as needed °Apply a “pea-sized” amount of the moisturizer onto your fingertip °Using you other hand, open the labia  °Apply the moisturizer to the vulvar/vaginal tissues °Wear loose fitting underwear/clothing if possible following application °Use moisturize up to 3 times daily as desired. ° °

## 2023-11-01 ENCOUNTER — Telehealth: Payer: Self-pay | Admitting: Internal Medicine

## 2023-11-01 NOTE — Telephone Encounter (Signed)
 See Nicole [name]'s note in separate encounter. Card information should still be active till 01/09/2024

## 2023-11-01 NOTE — Telephone Encounter (Signed)
 Pt wants to know if office still has her medication grant information for Repatha . Please advise.

## 2023-11-01 NOTE — Telephone Encounter (Signed)
 Tried to call, but voicemail not set up. Pt's [employer] is active through 01/09/2024. Sent message over Mychart as well.

## 2023-11-02 DIAGNOSIS — F5101 Primary insomnia: Secondary | ICD-10-CM | POA: Diagnosis not present

## 2023-11-02 DIAGNOSIS — F419 Anxiety disorder, unspecified: Secondary | ICD-10-CM | POA: Diagnosis not present

## 2023-11-02 DIAGNOSIS — R41840 Attention and concentration deficit: Secondary | ICD-10-CM | POA: Diagnosis not present

## 2023-11-08 ENCOUNTER — Telehealth: Payer: Self-pay | Admitting: Neurology

## 2023-11-08 NOTE — Telephone Encounter (Signed)
 Pt called to request Medication  refill for  Pt pregabalin  (LYRICA ) 50 MG capsule.Pt states that PCP recommend Neurologist to  fill Medication   Pt Pharmacy  [facility] Address: [address] [place], [place], [place] [address] Phone: [phone]

## 2023-11-12 ENCOUNTER — Telehealth: Payer: Self-pay

## 2023-11-12 NOTE — Telephone Encounter (Signed)
 Epic chat with PCP, she will manage lyrica  moving forward. Please do not refill from our office.   Call to [facility] at [facility] and to [facility] and asked for profile update and removing scripts from Dr. Onita.

## 2023-11-12 NOTE — Telephone Encounter (Signed)
 Mychart message sent to patient.

## 2023-11-14 NOTE — Telephone Encounter (Signed)
 Pt called stating that PCP informed Pt to get future refill from Neurologist  . Informed PT of Nurse Message . Pt states Pcp prefers Neurologist to fil medication pregabalin  (LYRICA ) 50 MG capsule    [facility]  Address: [address] [place], [place], [place] [address] Phone: [phone]

## 2023-11-28 ENCOUNTER — Other Ambulatory Visit: Payer: Self-pay | Admitting: Neurology

## 2023-11-28 MED ORDER — PREGABALIN 50 MG PO CAPS: 150.0000 mg | ORAL_CAPSULE | Freq: Every day | ORAL | 5 refills | Status: AC

## 2023-11-28 NOTE — Telephone Encounter (Signed)
 Pt is needing a refill on her pregabalin  (LYRICA ) 50 MG capsule sent in to the [facility] in [place]. She states her PCP advised her to have her Neurologist fill it for her.

## 2023-11-28 NOTE — Telephone Encounter (Signed)
 Requested Prescriptions   Pending Prescriptions Disp Refills   pregabalin  (LYRICA ) 50 MG capsule 90 capsule 5    Sig: Take 3 capsules (150 mg total) by mouth at bedtime.   Last seen 07/12/23 Next appt not scheduled Dispenses   Dispensed Days Supply Quantity Provider Pharmacy  PREGABALIN  50MG  CAP 10/20/2023 30 90 each Onita Duos, MD [facility] [phone] ...  PREGABALIN  50 MG CAPSULE 09/21/2023 30 90 each [facility] Duos, MD [facility] [phone] - G...  PREGABALIN  50MG  CAPSULES 09/08/2023 30 90 each Onita Duos, MD [facility] #...  PREGABALIN  50MG  CAP 08/26/2023 30 90 each Pahwani, Velna SAUNDERS, MD [facility] [phone] ...  PREGABALIN  50MG  CAPSULES 07/27/2023 30 90 each Onita Duos, MD [facility] #...  pregabalin  (LYRICA ) capsule 07/12/2023 90 270 Onita Duos, MD   PREGABALIN  50 MG CAPSULE 07/05/2023 30 90 each Onita Duos, MD [facility] [phone] - G...  PREGABALIN  50 MG CAPSULE 06/04/2023 30 90 each Onita Duos, MD [facility] [phone] - G...  PREGABALIN  50 MG CAPSULE 05/04/2023 30 90 each Onita Duos, MD [facility] [phone] - G...  PREGABALIN  50 MG CAPSULE 03/27/2023 30 90 each Onita Duos, MD [facility] [phone] - G...  PREGABALIN  50 MG CAPSULE 02/16/2023 30 90 each Onita Duos, MD [facility] [phone] - G...  PREGABALIN  50 MG CAPSULE 01/01/2023 30 90 each Onita Duos, MD [facility] [phone] - G...      Routing to provider to escribe

## 2023-12-06 ENCOUNTER — Ambulatory Visit: Admitting: Internal Medicine

## 2023-12-06 ENCOUNTER — Telehealth: Payer: Self-pay | Admitting: Internal Medicine

## 2023-12-06 NOTE — Telephone Encounter (Signed)
 Pt wanted to see if her grant was still active for medication Repatha  please advise

## 2023-12-06 NOTE — Telephone Encounter (Signed)
 I spoke with patient and let her know [employer] [place] is active through 01/09/2024.   Patient reports Jenna has been helping her with this grant in the past and is asking if she will continue to help her with this.

## 2023-12-07 ENCOUNTER — Ambulatory Visit: Attending: Internal Medicine | Admitting: Internal Medicine

## 2023-12-07 VITALS — BP 166/79 | HR 46 | Ht 62.0 in | Wt 108.2 lb

## 2023-12-07 DIAGNOSIS — M791 Myalgia, unspecified site: Secondary | ICD-10-CM

## 2023-12-07 DIAGNOSIS — E785 Hyperlipidemia, unspecified: Secondary | ICD-10-CM | POA: Diagnosis not present

## 2023-12-07 DIAGNOSIS — T466X5D Adverse effect of antihyperlipidemic and antiarteriosclerotic drugs, subsequent encounter: Secondary | ICD-10-CM

## 2023-12-07 DIAGNOSIS — I2510 Atherosclerotic heart disease of native coronary artery without angina pectoris: Secondary | ICD-10-CM | POA: Diagnosis not present

## 2023-12-07 DIAGNOSIS — I10 Essential (primary) hypertension: Secondary | ICD-10-CM | POA: Diagnosis not present

## 2023-12-07 NOTE — Patient Instructions (Addendum)
 Medication Instructions:  Your physician has recommended you make the following change in your medication:  Restart Amlodipine  5 mg once daily Continue taking all other medications as prescribed  Labwork: Repeat NMR-Fasting at [facility]   Testing/Procedures: None  Follow-Up: Your physician recommends that you schedule a follow-up appointment in: 1 year. You will receive a reminder call in about 8 months reminding you to schedule your appointment. If you don't receive this call, please contact our office.   Any Other Special Instructions Will Be Listed Below (If Applicable). Please Log your blood pressure on log attached and Dr. Mona recommended purchasing a pulse ox at your local pharmacy as well  Thank you for choosing [facility]!     If you need a refill on your cardiac medications before your next appointment, please call your pharmacy.

## 2023-12-07 NOTE — Progress Notes (Signed)
 12/07/2023 Avelina Cheek Hartshorne March 02, 1940 [id number]  CC: Follow-up  HPI:   Nashay Brickley Kras is a 83 y.o. female patient of Dr Mona, with a PMH below who presents today for a blood pressure check.  I last saw Ms. Ouk about 1 month ago when we added amlodipine  5mg  each day.  Today she has no complaints.  At her last visit she brought her home BP cuff, which was found to be accurate within 10 points.  Since the addition of amlodipine  her home readings have dropped significantly, in that now they average in the 130s systolic.  Per patient there was only one reading in the 150s systolic and nothing higher.  She does not use tobacco products or drink alcohol . She drinks minimal caffeine.  She does not add salt to her cooking, although she does eat some prepared foods.  She is going to her local [employer] 3-4 times per week and getting in a good workout of stair stepper and treadmill.  Because of a torn rotator cuff she is not able to do much in the way of weights.    Mrs. Claudio returns today for follow-up. She reports she's been significantly fatigued for the past several months and this is unlike her. It appears that her mood is somewhat depressed. She continues to exercise but does not seem to be helping her. She reports having her thyroid  checked fairly regularly and maybe her levothyroxine  was increased recently to 50 g. Blood pressure has been well controlled. She currently is being treated for sinus infection on amoxicillin. She has had some lightheadedness and dizziness. She never started Livalo  due to questions about the cholesterol medicine and liver function. She does have a history of mild abnormalities in her LFTs. This of course was the reason that I recommended that medication at our last office visit. She was also provided with samples which she has no idea where they are at.  Mrs. Porto returns today for follow-up. I previously tried her on low-dose Livalo  since she  had a history of elevated liver enzymes, but a recheck showed that this also caused her liver enzymes to rise. She was then taken off of this. She's currently asymptomatic. Blood pressure is mildly elevated today.  06/07/2015  Mrs. Hazell returns today for follow-up. She reports that recently she's been more depressed. She see her therapist but he is apparently got very busy and she's not been able to see him recently. She's not currently on any medicine for depression. She did have some chest discomfort which was attributed to reflux. It seems positional and worse if she laid on one side after eating at night. She's had some relief on omeprazole 20 mg twice a day. We have adjusted her medication including adding chlorthalidone  instead of hydrochlorothiazide. This is helped blood pressure and we've discontinued her beta blocker. Heart rate is, per the 40s to the 60s. She reports no change in symptoms.  07/14/2016  Mrs. Pagan was seen today follow-up. She recently underwent shoulder surgery and required additional surgery and she continues to have slow healing wearing a brace. She's had good control of her hypertension and has been losing some weight. She relates this to her surgery as well as ongoing problems with depression. She seems to be struggling with some relationship problems and is in alcoholic's anonymous. She denies any current alcohol  use. She also denies any chest pain or worsening shortness of breath.  05/02/2019  Mrs. Leisa is seen in follow-up today.  She is fatigued and notably grieving after the loss of her husband who was a patient of mine as well back in September.  In a since then she has had much difficulty sleeping at night.  She had a sleep study however the results are still pending.  Blood pressures well controlled today.  Notably her lipids were quite elevated recently.  She has been intolerant to statins due to myalgias and her total cholesterol was 254, triglycerides 127,  HDL 50 and LDL 181.  She reports this is longstanding and has not been able to get her numbers much lower.  Results are very concerning for possible familial hyperlipidemia.  08/18/2019  I had the pleasure to see Ms. Heyer back today.  She continues to grieve over the loss of her husband.  She has a counseling appointment tomorrow.  She has responded very well to Repatha .  Total cholesterol now is 170, triglycerides 124, HDL 54 and LDL of 94, down from 181.  09/29/2021  Ms. Leisa returns today for follow-up.  She seems to be doing much better on Repatha .  Cholesterol has improved significantly with total 154, triglycerides 147, HDL 55 and LDL 74.  She does not have the side effects that she experienced with statins.  She has had some pulmonary issues.  She was seen by pulmonary and noted on CT to have fibrosis as well as probable bronchiectasis with atypical infection.  Over the past week she has had some worsening shortness of breath, fever and drainage with nonproductive cough.  She has taken Mucinex for this with minimal relief.  She was noted to be hypotensive today at 90/60.  She reported she did not take her blood pressure medicines this morning.  07/28/2022  Ms. Leisa is here today for follow-up.  Overall she says she is doing fairly well.  She has recently had some more pulmonary evaluations including a CT scan which showed a nodule.  She has a follow-up scan next week and will follow-up with Dr. Neysa.  She denies any chest pain or worsening productive cough or shortness of breath.  She is overdue for lipids.  Her last cholesterol check was in December which showed total 145, HDL 51, triglycerides 154 and LDL 68.  She is on Repatha  and is tolerating it well.  12/07/2023  Ms. Leisa returns today for follow-up.  She reports her pulmonologist is about to retire.  She had a mass in the lung but it does not seem to be expanding.  She also has pulmonary fibrosis.  Her oxygen levels however  today were okay 98% but her heart rate was low at 46.  Most recent EKG showed sinus bradycardia.  In general she says her heart rate runs at using the 50s.  Blood pressure was elevated today.  After some discussion she noted that honestly she is not as compliant with amlodipine  as she should be and that may be a reason why her blood pressure is elevated.  She is taking olmesartan.  I have encouraged her to be on both medications and take home blood pressure readings.  She is due for repeat lipid.  She remains on Repatha  and gets a health well grant for this that is good until December.   Current Outpatient Medications  Medication Sig Dispense Refill   acetaminophen  (TYLENOL ) 500 MG tablet Take 1 tablet (500 mg total) by mouth every 6 (six) hours as needed (pain). 30 tablet 0   amLODipine  (NORVASC ) 5 MG tablet Take 1 tablet (  5 mg total) by mouth daily. (Patient taking differently: Take 5 mg by mouth daily. Explained to patient this medication is not a diuretic it is a calcium  blocker.   Advised when ever she questions a medication she should ask her physician that prescribes that medication and on this medication please call the provider Dr Mona let him know how you are taking this.) 90 tablet 3   Calcium  Carbonate-Vitamin D (CALCIUM -VITAMIN D3 PO) Take 1 capsule by mouth daily.     Cinnamon 500 MG capsule Take 500 mg by mouth 2 (two) times daily.     clonazePAM  (KLONOPIN ) 1 MG tablet Take 0.5-1 mg by mouth daily as needed for anxiety.     diclofenac  Sodium (VOLTAREN ) 1 % GEL APPLY 2 GRAMS 4 TIMES A DAY AS NEEDED 100 g 3   estradiol (ESTRACE) 0.1 MG/GM vaginal cream Place 0.5g in the vagina at night twice a week 42.5 g 11   Evolocumab  (REPATHA  SURECLICK) 140 MG/ML SOAJ INJECT 1 DOSE INTO THE SKIN EVERY 14 DAYS (Patient taking differently: Inject 140 mg as directed every 14 (fourteen) days. INJECT 1 DOSE INTO THE SKIN EVERY 14 DAYS) 2 mL 6   Glycerin-Hypromellose-PEG 400 ([facility] DRY EYE RELIEF) 0.2-0.2-1 %  SOLN Place 1 drop into both eyes 2 (two) times daily.     HYDROcodone -acetaminophen  (NORCO/VICODIN) 5-325 MG tablet Take 1 tablet by mouth every 6 (six) hours as needed for moderate pain (pain score 4-6).     levothyroxine  (SYNTHROID ) 75 MCG tablet Take 75 mcg by mouth daily before breakfast.     Misc Natural Products (MILK THISTLE) CAPS Take 1 capsule by mouth daily.     Multiple Vitamins-Minerals (MULTIVITAMIN GUMMIES ADULTS PO) Take 1 each by mouth daily.     mupirocin ointment (BACTROBAN) 2 % Apply 1 Application topically 2 (two) times daily.     olmesartan (BENICAR) 40 MG tablet Take 40 mg by mouth daily.     omeprazole (PRILOSEC) 20 MG capsule Take 20 mg by mouth daily as needed.     oxyCODONE  (OXY IR/ROXICODONE ) 5 MG immediate release tablet Take 1 tablet (5 mg total) by mouth every 4 (four) hours as needed for severe pain (pain score 7-10). 15 tablet 0   phenazopyridine  (PYRIDIUM ) 95 MG tablet Take 1 tablet (95 mg total) by mouth 3 (three) times daily as needed. 15 tablet 1   pregabalin  (LYRICA ) 50 MG capsule Take 3 capsules (150 mg total) by mouth at bedtime. 90 capsule 5   No current facility-administered medications for this visit.    Allergies  Allergen Reactions   Chlorthalidone  Other (See Comments)    hyponatremia   Crestor [Rosuvastatin] Diarrhea    Muscle aches   Keppra  [Levetiracetam ]     Depression   Pitavastatin  Other (See Comments)    Elevated LFTs   Lamictal  [Lamotrigine ] Rash   Nitrofuran Derivatives Rash    Past Medical History:  Diagnosis Date   Anxiety disorder    Arthritis    Bradycardia    Bronchiectasis (HCC)    CKD (chronic kidney disease), stage I    Depression    Diverticulosis of colon    w/ hx diverticulitis   GERD (gastroesophageal reflux disease)    Hepatic steatosis    Hiatal hernia    History of basal cell carcinoma (BCC) excision    HTN (hypertension)    followed by cardology--- dr mona and pcp;   CTC 06-11-2019  calcium  score= 2.3;   NUC 05-29-1998  LR w/  normal perfusion, nuclear ef 63%   Hyperlipemia    intolerent to statins;  followed by cardiology--- dr mona   Hypothyroidism    followe by pcp   Interstitial lung disease (HCC)    (09-07-2023  pt stated able to do stairs/ long distance walk/ household chores w/ no SOB) pulmonary--- dr c. young;  ILD/ UIP bronchiectasis;  PFT 04-12-2020  mim obstruction/ mim DLCO deficit;   Osteoporosis    Paresthesia of both lower extremities    neurologist--- dr onita;   negative EMG nerve conduction test 02-12-2018 for neuropathy   PONV (postoperative nausea and vomiting)    Pre-diabetes    Prolapse of female pelvic organs    RLS (restless legs syndrome)    Sleep disorder    hx sleep study in epic 04-28-2019  no OSA , AHI 1.2/hr   Urine incontinence    urologist--- dr gaston   Venous insufficiency of leg     Blood pressure (!) 166/79, pulse (!) 46, height 5' 2 (1.575 m), weight 108 lb 3.2 oz (49.1 kg), SpO2 98%.   EXAM: General appearance: alert and no distress Neck: no carotid bruit, no JVD, and thyroid  not enlarged, symmetric, no tenderness/mass/nodules Lungs: clear to auscultation bilaterally Heart: regular rate and rhythm, S1, S2 normal, no murmur, click, rub or gallop Abdomen: soft, non-tender; bowel sounds normal; no masses,  no organomegaly Extremities: extremities normal, atraumatic, no cyanosis or edema Pulses: 2+ and symmetric Skin: Skin color, texture, turgor normal. No rashes or lesions Neurologic: Grossly normal Psych: Pleasant   EKG: Deferred  IMPRESSION: Hypertension-uncontrolled Atypical chest pain Possible familial dyslipidemia with statin intolerance due to nonalcoholic fatty liver disease, elevated LFTs and myalgias GERD Pulmonary fibrosis/bronchiectasis Coronary artery calcification  PLAN:   1.  Mrs. Carmon seems to be doing well without chest pain or significant shortness of breath.  Oxygen levels were okay today but her heart rate was  low and have encouraged her to pick up a pulse oximeter to see if she can monitor her heart rate with exertion to see if if it goes up.  She has been admittedly noncompliant with her amlodipine  for unknown reasons but I would advise her to take that in addition to the losartan  and monitor home blood pressure readings.  Will try to target a blood pressure 120/80 which she has had before.  She is due for repeat lipids but is not fasting today.  I will order an NMR and she will likely need reauthorization of her Repatha  next month.  This would also include renewal of her health well grant.  Follow-up with me annually or sooner as necessary.  Vinie KYM Mona, MD, [facility], FNLA, FACP  [facility]  Medical Director of the [facility] Lipid [facility] &  Cardiovascular Risk Reduction Clinic Diplomate of the American Board of Clinical Lipidology Attending Cardiologist  Direct Dial: [phone]  Fax: [phone]  Website:  [email or web address]

## 2023-12-11 ENCOUNTER — Ambulatory Visit: Payer: Self-pay | Admitting: Internal Medicine

## 2023-12-11 ENCOUNTER — Telehealth: Payer: Self-pay | Admitting: Pharmacy Technician

## 2023-12-11 LAB — NMR, LIPOPROFILE
Cholesterol, Total: 145 mg/dL (ref 100–199)
HDL Particle Number: 34.1 umol/L (ref >=30.5)
HDL-C: 36 mg/dL — ABNORMAL LOW (ref >39)
LDL Particle Number: 556 nmol/L (ref <1000)
LDL Size: 19.7 nm — ABNORMAL LOW (ref >20.5)
LDL-C (NIH Calc): 69 mg/dL (ref 0–99)
LP-IR Score: 56 — ABNORMAL HIGH (ref <=45)
Small LDL Particle Number: 436 nmol/L (ref <=527)
Triglycerides: 249 mg/dL — ABNORMAL HIGH (ref 0–149)

## 2023-12-11 NOTE — Telephone Encounter (Signed)
 LM for patient that I will send message to our PA/med assistance team to check on re-authorizing [facility] grant.

## 2023-12-11 NOTE — Telephone Encounter (Signed)
   Patient Advocate Encounter   The patient was approved for a [employer] grant that will help cover the cost of repatha  Total amount awarded, 2500.  Effective: 01/10/24 - 01/08/25   APW:[id number] ERW:[id number] Hmnle:[id number] PI:[id number] Healthwell ID: [id number]   Pharmacy provided with approval and processing information. Patient informed via mychart   Given to [employer]

## 2023-12-12 ENCOUNTER — Telehealth: Payer: Self-pay | Admitting: Internal Medicine

## 2023-12-12 NOTE — Telephone Encounter (Signed)
*  STAT* If patient is at the pharmacy, call can be transferred to refill team.   1. Which medications need to be refilled? (please list name of each medication and dose if known)   amLODipine  (NORVASC ) 5 MG tablet   NEW PHARMACY   4. Which pharmacy/location (including street and city if local pharmacy) is medication to be sent to?  [id number] [place], [place] - [address] AT [facility] OF [address] Phone: [phone]  Fax: [phone]       5. Do they need a 30 day or 90 day supply? 90

## 2023-12-13 MED ORDER — AMLODIPINE BESYLATE 5 MG PO TABS: 5.0000 mg | ORAL_TABLET | Freq: Every day | ORAL | 3 refills | Status: AC

## 2023-12-13 NOTE — Telephone Encounter (Signed)
 Patient would like a call back to review results.

## 2023-12-13 NOTE — Telephone Encounter (Signed)
 Refill sent.

## 2023-12-31 ENCOUNTER — Telehealth: Payer: Self-pay | Admitting: Internal Medicine

## 2023-12-31 NOTE — Telephone Encounter (Signed)
 Called patient and made her aware of the lab results and left message to call office for any questions.

## 2023-12-31 NOTE — Telephone Encounter (Signed)
 Patient called for her lab results.

## 2024-01-21 NOTE — Progress Notes (Signed)
 HPI F never smoker,followed for lung Nodules, Bronchiectasis, ILD/UIP  complicated by  HTN, GERD, Hepatic Steatosis, Hypothyroid, PONV, Arthritis, Hyperlipidemia, CAD, Aortic Atherosclerosis,   (widow of my patient [name] who died with complications of thymic cancer, esophageal cancer, aspiration pneumonia, chronic lung disease.) Lab- [facility] TB Gold 11/27/19- Negative HRCT chest 01/07/20-  considered probable usual interstitial pneumonia (UIP) mucoid impaction in the lingula accounting for the micro and macronodularity in this region. 2. Aortic atherosclerosis, in addition to two vessel coronary artery disease. HST 04/24/19- AHI 1.2/ hr PFT 04/12/20- minimal obstruction(possible), no response to BD, Nl lung volumes, minimal DLCO deficit UIP w/u labs 3/29- negative. =========================================================   08/17/23- 82yoF 81 yoF never smoker,followed for lung Nodules, Bronchiectasis, ILD/UIP  complicated by  HTN, GERD, Hepatic Steatosis, Hypothyroid, PONV, Arthritis, Hyperlipidemia, CAD, Aortic Atherosclerosis,   (widow of my patient Nicole [name] who died with complications of thymic cancer, esophageal cancer, aspiration pneumonia, chronic lung disease.) -clonazepam  1 mg, doxepin 50, Norco, temazepam  15 She has wanted to avoid intervention for ILD. Now needs preop risk assessment for GOT hysterectomy-- prolapse ------Breathing is good.  No problems noted.   Discussed the use of AI scribe software for clinical note transcription with the patient, who gave verbal consent to proceed.  History of Present Illness   Nicole [name] is an 83 year old female with interstitial lung disease who presents for preoperative clearance. She was referred by Dr. Marilynne for preoperative clearance.  Her breathing is stable with no acute symptoms like fever or productive cough. Recent pulmonary function tests and CT scan results have been reviewed. A recent CT scan showed  mild inflammation in the lingula of the left lung. She is asks switching from annual CT scans to annual chest x-rays due to cost concerns, since she has chosen not to have antifibrotic therapy for her ILD.SABRA  She has interstitial lung disease with no significant progression in the last year. She has chosen not to take medication for ILD due to side effects experienced by others.     Assessment and Plan:    Interstitial lung disease Interstitial lung disease consistent with usual interstitial pneumonia. Progression noted since 2021, stable in the last year. Declined treatment due to medication side effects. Monitoring preferred. - Schedule follow-up in six months with a chest x-ray. - Recommend follow-up with Dr. Geronimo or Dr. Valorie for ongoing management post-retirement.  Mycobacterium avium complex infection- suspected Mild inflammation in the lingula likely due to Mycobacterium avium complex infection. Slow-growing, non-contagious, no significant progression. Monitoring preferred over treatment due to potential side effects of the prolonged antibiotic regimen usually required.. - Monitor with annual chest x-ray instead of CT scans.  Preoperative evaluation- clear to proceed as planned from Pulmonary standpoint. No pulmonary contraindications for surgery on the 18th. Shared recent pulmonary function test and CT scan results with surgical team. - Ensure surgical team, including anesthesiologist, has access to recent pulmonary function test and CT scan results.    History of Present Illness  Pulmonary Function Test 04/12/20- : Minimal Obstructive Airways Disease- possible Insignificant response to bronchodilator Normal lung volumes Minimal Diffusion Defect  HRCT chest 02/16/23 MPRESSION: 1. Pulmonary parenchymal findings favor chronic atypical mycobacterial infection (MAI) in the lingula superimposed on pulmonary parenchymal findings of mild basilar predominant fibrotic interstitial lung  disease compatible with probable UIP. No compelling progression of ILD since 05/15/2022 high-resolution chest CT. Mild progression of ILD since baseline 01/07/2020 high-resolution chest CT. Findings are categorized as probable UIP per consensus  guidelines: Diagnosis of Idiopathic Pulmonary Fibrosis: An Official ATS/ERS/JRS/ALAT Clinical Practice Guideline. Am JINNY [employer] Crit Care Med Vol 198, Iss 5, [phone], Sep 23 2016. 2. Two-vessel coronary atherosclerosis. 3.  Aortic Atherosclerosis (ICD10-I70.0).    01/22/24-83 yoF never smoker,followed for lung Nodules, Bronchiectasis, ILD/UIP  complicated by  HTN, GERD, Hepatic Steatosis, Hypothyroid, PONV, Arthritis, Hyperlipidemia, CAD, Aortic Atherosclerosis,   (widow of my patient Nicole [name] who died with complications of thymic cancer, esophageal cancer, aspiration pneumonia, chronic lung disease.) -clonazepam  1 mg, doxepin 50, Norco, temazepam  15 She has wanted to avoid intervention for ILD. Discussed the use of AI scribe software for clinical note transcription with the patient, who gave verbal consent to proceed.  History of Present Illness   Nicole [name] is an 83 year old female who presents for f/u, mentioning nasal irritation from fragrances.  She develops nasal congestion and irritation when exposed to fragrances, especially when sheets are washed with scented detergent, which disturbs her sleep.  She uses generic Flonase once daily. Claritin has not improved her nasal symptoms.  She has interstitial lung disease and uses four in, four out breathing exercises to relax and help with sleep.  Her first cousin has a similar lung condition. Both were raised in homes with heavy parental smoking, which she believes contributed to her lung disease. Consider NSIP.  She has chosen not to have antifibrotic therapy. She has seen Dr Geronimo in the past.  She reports nasal irritation with fragrances this winter but no severe  allergic reactions or other significant respiratory illness. Currently she feels she is doing well.     Assessment and Plan:    Pulmonary fibrosis/ ILD Stable, no new symptoms. Oxygen saturation 96%, pulse 57. Discussed genetic predisposition and smoking exposure. New medication available but lacks data. - Continue current management. - Schedule follow-up with pulmonologist next year.  Vasomotor/ nonallergic Rhinitis Symptoms triggered by strong odors. Claritin ineffective. Discussed vasomotor response and Flonase as treatment. Explained Flonase side effects. Prefers nasal spray. - Use Flonase nasal spray once daily at bedtime if helpful. - Consider fragrance-free detergents. - Consider alternative antihistamines like Zyrtec or Allegra if needed. -Consider ipratropium nasal spray     ROS-see HPI   + = positive Constitutional:    weight loss, night sweats, fevers, chills, fatigue, lassitude. HEENT:    headaches, difficulty swallowing, tooth/dental problems, sore throat,       sneezing, itching, ear ache, nasal congestion, post nasal drip, snoring CV:    chest pain, orthopnea, PND, swelling in lower extremities, anasarca,                                   dizziness, palpitations Resp:   shortness of breath with exertion or at rest.                productive cough,   non-productive cough, coughing up of blood.              change in color of mucus.  wheezing.   Skin:    rash or lesions. GI:  No-   heartburn, indigestion, abdominal pain, nausea, vomiting, diarrhea,                 change in bowel habits, loss of appetite GU: dysuria, change in color of urine, no urgency or frequency.   flank pain. MS:   joint pain, stiffness, decreased range of motion, back pain.  Neuro-    Peripheral neuropathy Psych:  change in mood or affect.  depression or anxiety.   memory loss.  OBJ- Physical Exam General- Alert, Oriented, Affect-appropriate, Distress- none acute, + slender Skin- rash-none,  lesions- none, excoriation- none Lymphadenopathy- none Head- atraumatic            Eyes- Gross vision intact, PERRLA, conjunctivae and secretions clear            Ears- Hearing, canals-normal            Nose- Clear, no-Septal dev, mucus, polyps, erosion, perforation             Throat- Mallampati II , mucosa clear , drainage- none, tonsils- atrophic Neck- flexible , trachea midline, no stridor , thyroid  nl, carotid no bruit Chest - symmetrical excursion , unlabored           Heart/CV- RRR , no murmur , no gallop  , no rub, nl s1 s2                           - JVD- none , edema- none, stasis changes- none, varices- none           Lung- clear to P&A-no crackles heard, wheeze- none, cough- none , dullness-none, rub- none           Chest wall-  Abd-  Br/ Gen/ Rectal- Not done, not indicated Extrem- cyanosis- none, clubbing, none, atrophy- none, strength- nl Neuro- grossly intact to observation

## 2024-01-22 ENCOUNTER — Encounter: Payer: Self-pay | Admitting: Internal Medicine

## 2024-01-22 ENCOUNTER — Ambulatory Visit: Admitting: Internal Medicine

## 2024-01-22 VITALS — BP 164/78 | HR 57 | Ht 62.0 in | Wt 109.2 lb

## 2024-01-22 DIAGNOSIS — J849 Interstitial pulmonary disease, unspecified: Secondary | ICD-10-CM | POA: Diagnosis not present

## 2024-01-22 DIAGNOSIS — J300 Vasomotor rhinitis: Secondary | ICD-10-CM

## 2024-01-22 DIAGNOSIS — J8410 Pulmonary fibrosis, unspecified: Secondary | ICD-10-CM

## 2024-01-22 NOTE — Patient Instructions (Signed)
 Glad you are doing well-  At checkout, ask the front desk to bring you back with Dr Geronimo, Dr Theophilus or Dr Maryalice next year.

## 2024-01-29 ENCOUNTER — Encounter: Payer: Self-pay | Admitting: Internal Medicine

## 2024-02-18 ENCOUNTER — Ambulatory Visit: Admitting: Obstetrics and Gynecology

## 2024-02-28 ENCOUNTER — Telehealth: Payer: Self-pay | Admitting: Pharmacist

## 2024-02-28 NOTE — Progress Notes (Signed)
" ° °  02/28/2024  Patient ID: Avelina Vedia Simpers, female   DOB: [birth year], 84 y.o.   MRN: [id number]  Drug question from Dr. Vernon related to Osteoporosis management.  Checked pt insurance for the generic Prolia , pt would still have a 20% for jubbonti, pt states that she can not afford that and pt has a reaction to the Reclast  infusion, would pt benefit from oral medication for her osteoporosis?  Patient stated that she would be interested in taking oral medication if that is a cheaper option.  -This patient had side effects with Reclast .  She has osteoporosis.  Jubbonti is too expensive.  Do you think Fosamax is a good option?  I think the once weekly Fosamax would be good. She has no GI ulcer history or Barrett's esophagus. Kidney function is also above 35 and calcium  within range. I would highly recommend getting the Vitamin D back within range during treatment however to ensure full efficacy. Could consider the 50,000IU for 8 weeks if she didn't manage to get the Vitamin D supplement OTC yet.  Advised to hold Fosamax initiation until August 2026 due to last Reclast  infusion date of 08/2023.    Aloysius Lewis, PharmD, [facility] Phone Number: [phone]  "

## 2024-02-29 NOTE — Telephone Encounter (Signed)
Patient is requesting a call back to go over lab results.

## 2024-03-03 ENCOUNTER — Ambulatory Visit: Admitting: Obstetrics and Gynecology

## 2024-03-19 ENCOUNTER — Ambulatory Visit: Admitting: Obstetrics and Gynecology
